# Patient Record
Sex: Male | Born: 1960 | Race: White | Hispanic: No | Marital: Single | Smoking: Current every day smoker
Health system: Southern US, Community
[De-identification: ages and names within clinical notes are randomized; demographics above are authoritative.]

## PROBLEM LIST (undated history)

## (undated) DIAGNOSIS — E119 Type 2 diabetes mellitus without complications: Secondary | ICD-10-CM

## (undated) DIAGNOSIS — I10 Essential (primary) hypertension: Secondary | ICD-10-CM

## (undated) DIAGNOSIS — J439 Emphysema, unspecified: Secondary | ICD-10-CM

## (undated) HISTORY — PX: ABDOMINAL SURGERY: SHX537

---

## 2006-10-16 ENCOUNTER — Ambulatory Visit (HOSPITAL_COMMUNITY): Admission: RE | Admit: 2006-10-16 | Discharge: 2006-10-16 | Payer: Self-pay | Admitting: Preventative Medicine

## 2006-10-17 ENCOUNTER — Encounter (HOSPITAL_COMMUNITY): Admission: RE | Admit: 2006-10-17 | Discharge: 2006-11-16 | Payer: Self-pay | Admitting: Preventative Medicine

## 2018-02-17 ENCOUNTER — Ambulatory Visit: Payer: Self-pay | Admitting: Nutrition

## 2018-03-19 ENCOUNTER — Ambulatory Visit: Payer: Self-pay | Admitting: "Endocrinology

## 2019-05-26 ENCOUNTER — Encounter (INDEPENDENT_AMBULATORY_CARE_PROVIDER_SITE_OTHER): Payer: Self-pay | Admitting: *Deleted

## 2019-08-18 ENCOUNTER — Other Ambulatory Visit: Payer: Self-pay

## 2019-08-18 ENCOUNTER — Inpatient Hospital Stay (HOSPITAL_COMMUNITY)
Admission: EM | Admit: 2019-08-18 | Discharge: 2019-08-22 | DRG: 152 | Disposition: A | Discharge status: Home / Self Care | Payer: Medicaid Other | Attending: Internal Medicine | Admitting: Internal Medicine

## 2019-08-18 ENCOUNTER — Emergency Department (HOSPITAL_COMMUNITY): Payer: Medicaid Other

## 2019-08-18 ENCOUNTER — Encounter (HOSPITAL_COMMUNITY): Payer: Self-pay

## 2019-08-18 DIAGNOSIS — E785 Hyperlipidemia, unspecified: Secondary | ICD-10-CM | POA: Diagnosis present

## 2019-08-18 DIAGNOSIS — I10 Essential (primary) hypertension: Secondary | ICD-10-CM | POA: Diagnosis not present

## 2019-08-18 DIAGNOSIS — E872 Acidosis: Secondary | ICD-10-CM | POA: Diagnosis present

## 2019-08-18 DIAGNOSIS — Z880 Allergy status to penicillin: Secondary | ICD-10-CM

## 2019-08-18 DIAGNOSIS — T380X5A Adverse effect of glucocorticoids and synthetic analogues, initial encounter: Secondary | ICD-10-CM | POA: Diagnosis not present

## 2019-08-18 DIAGNOSIS — Z4659 Encounter for fitting and adjustment of other gastrointestinal appliance and device: Secondary | ICD-10-CM

## 2019-08-18 DIAGNOSIS — G9341 Metabolic encephalopathy: Secondary | ICD-10-CM | POA: Diagnosis not present

## 2019-08-18 DIAGNOSIS — R1310 Dysphagia, unspecified: Secondary | ICD-10-CM | POA: Diagnosis not present

## 2019-08-18 DIAGNOSIS — J9600 Acute respiratory failure, unspecified whether with hypoxia or hypercapnia: Secondary | ICD-10-CM | POA: Diagnosis not present

## 2019-08-18 DIAGNOSIS — J9601 Acute respiratory failure with hypoxia: Secondary | ICD-10-CM | POA: Diagnosis present

## 2019-08-18 DIAGNOSIS — J0511 Acute epiglottitis with obstruction: Secondary | ICD-10-CM | POA: Diagnosis not present

## 2019-08-18 DIAGNOSIS — F419 Anxiety disorder, unspecified: Secondary | ICD-10-CM | POA: Diagnosis present

## 2019-08-18 DIAGNOSIS — J432 Centrilobular emphysema: Secondary | ICD-10-CM | POA: Diagnosis not present

## 2019-08-18 DIAGNOSIS — R739 Hyperglycemia, unspecified: Secondary | ICD-10-CM

## 2019-08-18 DIAGNOSIS — Z79899 Other long term (current) drug therapy: Secondary | ICD-10-CM

## 2019-08-18 DIAGNOSIS — F17210 Nicotine dependence, cigarettes, uncomplicated: Secondary | ICD-10-CM | POA: Diagnosis present

## 2019-08-18 DIAGNOSIS — Z20822 Contact with and (suspected) exposure to covid-19: Secondary | ICD-10-CM | POA: Diagnosis not present

## 2019-08-18 DIAGNOSIS — R490 Dysphonia: Secondary | ICD-10-CM | POA: Diagnosis present

## 2019-08-18 DIAGNOSIS — R319 Hematuria, unspecified: Secondary | ICD-10-CM | POA: Diagnosis present

## 2019-08-18 DIAGNOSIS — E1165 Type 2 diabetes mellitus with hyperglycemia: Secondary | ICD-10-CM | POA: Diagnosis present

## 2019-08-18 DIAGNOSIS — J029 Acute pharyngitis, unspecified: Secondary | ICD-10-CM | POA: Diagnosis present

## 2019-08-18 DIAGNOSIS — Z01818 Encounter for other preprocedural examination: Secondary | ICD-10-CM

## 2019-08-18 DIAGNOSIS — F329 Major depressive disorder, single episode, unspecified: Secondary | ICD-10-CM | POA: Diagnosis not present

## 2019-08-18 DIAGNOSIS — J0510 Acute epiglottitis without obstruction: Secondary | ICD-10-CM | POA: Diagnosis present

## 2019-08-18 HISTORY — DX: Emphysema, unspecified: J43.9

## 2019-08-18 HISTORY — DX: Essential (primary) hypertension: I10

## 2019-08-18 HISTORY — DX: Type 2 diabetes mellitus without complications: E11.9

## 2019-08-18 LAB — URINALYSIS, ROUTINE W REFLEX MICROSCOPIC
Bacteria, UA: NONE SEEN
Bilirubin Urine: NEGATIVE
Glucose, UA: >=500 mg/dL — AB
Ketones, ur: 20 mg/dL — AB
Leukocytes,Ua: NEGATIVE
Nitrite: NEGATIVE
Protein, ur: NEGATIVE mg/dL
Specific Gravity, Urine: 1.038 — ABNORMAL HIGH (ref 1.005–1.030)
pH: 5 (ref 5.0–8.0)

## 2019-08-18 LAB — CBC WITH DIFFERENTIAL/PLATELET
Abs Immature Granulocytes: 0.28 10*3/uL — ABNORMAL HIGH (ref 0.00–0.07)
Basophils Absolute: 0.1 10*3/uL (ref 0.0–0.1)
Basophils Relative: 0 %
Eosinophils Absolute: 0 10*3/uL (ref 0.0–0.5)
Eosinophils Relative: 0 %
HCT: 51.1 % (ref 39.0–52.0)
Hemoglobin: 17.4 g/dL — ABNORMAL HIGH (ref 13.0–17.0)
Immature Granulocytes: 1 %
Lymphocytes Relative: 4 %
Lymphs Abs: 0.8 10*3/uL (ref 0.7–4.0)
MCH: 29.9 pg (ref 26.0–34.0)
MCHC: 34.1 g/dL (ref 30.0–36.0)
MCV: 87.8 fL (ref 80.0–100.0)
Monocytes Absolute: 1.3 10*3/uL — ABNORMAL HIGH (ref 0.1–1.0)
Monocytes Relative: 6 %
Neutro Abs: 19 10*3/uL — ABNORMAL HIGH (ref 1.7–7.7)
Neutrophils Relative %: 89 %
Platelets: 191 10*3/uL (ref 150–400)
RBC: 5.82 MIL/uL — ABNORMAL HIGH (ref 4.22–5.81)
RDW: 12.6 % (ref 11.5–15.5)
WBC: 21.5 10*3/uL — ABNORMAL HIGH (ref 4.0–10.5)
nRBC: 0 % (ref 0.0–0.2)

## 2019-08-18 LAB — GLUCOSE, CAPILLARY
Glucose-Capillary: 414 mg/dL — ABNORMAL HIGH (ref 70–99)
Glucose-Capillary: 388 mg/dL — ABNORMAL HIGH (ref 70–99)
Glucose-Capillary: 309 mg/dL — ABNORMAL HIGH (ref 70–99)

## 2019-08-18 LAB — RESPIRATORY PANEL BY RT PCR (FLU A&B, COVID)
Influenza A by PCR: NEGATIVE
Influenza B by PCR: NEGATIVE
SARS Coronavirus 2 by RT PCR: NEGATIVE

## 2019-08-18 LAB — BASIC METABOLIC PANEL
Anion gap: 11 (ref 5–15)
BUN: 15 mg/dL (ref 6–20)
CO2: 23 mmol/L (ref 22–32)
Calcium: 9.5 mg/dL (ref 8.9–10.3)
Chloride: 99 mmol/L (ref 98–111)
Creatinine, Ser: 0.96 mg/dL (ref 0.61–1.24)
GFR calc Af Amer: >60 mL/min (ref >60)
GFR calc non Af Amer: >60 mL/min (ref >60)
Glucose, Bld: 461 mg/dL — ABNORMAL HIGH (ref 70–99)
Potassium: 4.5 mmol/L (ref 3.5–5.1)
Sodium: 133 mmol/L — ABNORMAL LOW (ref 135–145)

## 2019-08-18 LAB — RESPIRATORY PANEL BY PCR

## 2019-08-18 LAB — GROUP A STREP BY PCR: Group A Strep by PCR: NOT DETECTED

## 2019-08-18 LAB — LACTIC ACID, PLASMA
Lactic Acid, Venous: 2.1 mmol/L (ref 0.5–1.9)
Lactic Acid, Venous: 2.1 mmol/L (ref 0.5–1.9)

## 2019-08-18 LAB — HIV ANTIBODY (ROUTINE TESTING W REFLEX): HIV Screen 4th Generation wRfx: NONREACTIVE

## 2019-08-18 LAB — MRSA PCR SCREENING: MRSA by PCR: NEGATIVE

## 2019-08-18 LAB — HEMOGLOBIN A1C
Hgb A1c MFr Bld: 11.8 % — ABNORMAL HIGH (ref 4.8–5.6)
Mean Plasma Glucose: 291.96 mg/dL

## 2019-08-18 LAB — MONONUCLEOSIS SCREEN: Mono Screen: NEGATIVE

## 2019-08-18 LAB — CBG MONITORING, ED: Glucose-Capillary: 454 mg/dL — ABNORMAL HIGH (ref 70–99)

## 2019-08-18 MED ORDER — ROCURONIUM BROMIDE 50 MG/5ML IV SOLN
100.0000 mg | Freq: Once | INTRAVENOUS | Status: AC
  Administered 2019-08-18: 10:00:00 100 mg via INTRAVENOUS

## 2019-08-18 MED ORDER — KETAMINE HCL 10 MG/ML IJ SOLN: 1.0000 mg/kg | Freq: Once | INTRAMUSCULAR | Status: AC

## 2019-08-18 MED ORDER — PROPOFOL 1000 MG/100ML IV EMUL
INTRAVENOUS | Status: AC
  Administered 2019-08-18: 10:00:00 10 ug/kg/min via INTRAVENOUS
  Filled 2019-08-18: qty 100

## 2019-08-18 MED ORDER — FENTANYL CITRATE (PF) 100 MCG/2ML IJ SOLN
50.0000 ug | Freq: Once | INTRAMUSCULAR | Status: AC
  Administered 2019-08-18: 50 ug via INTRAVENOUS
  Filled 2019-08-18: qty 2

## 2019-08-18 MED ORDER — FENTANYL CITRATE (PF) 100 MCG/2ML IJ SOLN
50.0000 ug | Freq: Once | INTRAMUSCULAR | Status: AC
  Administered 2019-08-18: 10:00:00 50 ug via INTRAVENOUS
  Filled 2019-08-18: qty 2

## 2019-08-18 MED ORDER — DEXAMETHASONE SODIUM PHOSPHATE 10 MG/ML IJ SOLN
10.0000 mg | INTRAMUSCULAR | Status: DC
  Administered 2019-08-19: 09:00:00 10 mg via INTRAVENOUS
  Filled 2019-08-18: qty 1

## 2019-08-18 MED ORDER — INSULIN ASPART 100 UNIT/ML ~~LOC~~ SOLN
0.0000 [IU] | Freq: Three times a day (TID) | SUBCUTANEOUS | Status: DC
  Administered 2019-08-18: 16:00:00 15 [IU] via SUBCUTANEOUS

## 2019-08-18 MED ORDER — PANTOPRAZOLE SODIUM 40 MG IV SOLR
40.0000 mg | Freq: Every day | INTRAVENOUS | Status: DC
  Administered 2019-08-18 – 2019-08-20 (×3): 40 mg via INTRAVENOUS
  Filled 2019-08-18 (×3): qty 40

## 2019-08-18 MED ORDER — INSULIN ASPART 100 UNIT/ML ~~LOC~~ SOLN
0.0000 [IU] | SUBCUTANEOUS | Status: DC
  Administered 2019-08-18 (×2): 11 [IU] via SUBCUTANEOUS
  Administered 2019-08-19 (×3): 3 [IU] via SUBCUTANEOUS
  Administered 2019-08-20: 04:00:00 4 [IU] via SUBCUTANEOUS
  Administered 2019-08-20 – 2019-08-21 (×2): 3 [IU] via SUBCUTANEOUS
  Administered 2019-08-21: 16:00:00 4 [IU] via SUBCUTANEOUS
  Administered 2019-08-21: 7 [IU] via SUBCUTANEOUS
  Administered 2019-08-22: 3 [IU] via SUBCUTANEOUS
  Administered 2019-08-22: 10:00:00 7 [IU] via SUBCUTANEOUS

## 2019-08-18 MED ORDER — IPRATROPIUM-ALBUTEROL 0.5-2.5 (3) MG/3ML IN SOLN: 3.0000 mL | RESPIRATORY_TRACT | Status: DC | PRN

## 2019-08-18 MED ORDER — PROPOFOL 1000 MG/100ML IV EMUL
0.0000 ug/kg/min | INTRAVENOUS | Status: DC
  Administered 2019-08-18: 16:00:00 40 ug/kg/min via INTRAVENOUS
  Administered 2019-08-18: 14:00:00 50 ug/kg/min via INTRAVENOUS
  Administered 2019-08-18: 22:00:00 40 ug/kg/min via INTRAVENOUS
  Administered 2019-08-19: 18:00:00 25 ug/kg/min via INTRAVENOUS
  Administered 2019-08-19 (×2): 30 ug/kg/min via INTRAVENOUS
  Administered 2019-08-20: 09:00:00 25 ug/kg/min via INTRAVENOUS
  Administered 2019-08-20: 01:00:00 30 ug/kg/min via INTRAVENOUS
  Filled 2019-08-18 (×7): qty 100

## 2019-08-18 MED ORDER — INSULIN ASPART 100 UNIT/ML ~~LOC~~ SOLN: 0.0000 [IU] | Freq: Every day | SUBCUTANEOUS | Status: DC

## 2019-08-18 MED ORDER — BISACODYL 10 MG RE SUPP: 10.0000 mg | Freq: Every day | RECTAL | Status: DC | PRN

## 2019-08-18 MED ORDER — HYDROMORPHONE HCL 1 MG/ML IJ SOLN
1.0000 mg | Freq: Once | INTRAMUSCULAR | Status: DC
  Filled 2019-08-18: qty 1

## 2019-08-18 MED ORDER — DOCUSATE SODIUM 50 MG/5ML PO LIQD: 100.0000 mg | Freq: Two times a day (BID) | ORAL | Status: DC | PRN

## 2019-08-18 MED ORDER — MIDAZOLAM HCL 2 MG/2ML IJ SOLN
2.0000 mg | Freq: Once | INTRAMUSCULAR | Status: AC
  Administered 2019-08-18: 14:00:00 2 mg via INTRAVENOUS

## 2019-08-18 MED ORDER — HYDROCODONE-ACETAMINOPHEN 7.5-325 MG/15ML PO SOLN
10.0000 mL | Freq: Once | ORAL | Status: AC
  Administered 2019-08-18: 08:00:00 10 mL via ORAL
  Filled 2019-08-18: qty 15

## 2019-08-18 MED ORDER — ALBUTEROL SULFATE (2.5 MG/3ML) 0.083% IN NEBU
10.0000 mg | INHALATION_SOLUTION | Freq: Once | RESPIRATORY_TRACT | Status: AC
  Administered 2019-08-18: 10:00:00 10 mg via RESPIRATORY_TRACT

## 2019-08-18 MED ORDER — MIDAZOLAM HCL 2 MG/2ML IJ SOLN
2.0000 mg | Freq: Once | INTRAMUSCULAR | Status: AC
  Administered 2019-08-18: 2 mg via INTRAVENOUS
  Filled 2019-08-18: qty 2

## 2019-08-18 MED ORDER — LACTATED RINGERS IV BOLUS
1000.0000 mL | Freq: Once | INTRAVENOUS | Status: AC
  Administered 2019-08-18: 17:00:00 1000 mL via INTRAVENOUS

## 2019-08-18 MED ORDER — ETOMIDATE 2 MG/ML IV SOLN: 0.3000 mg/kg | Freq: Once | INTRAVENOUS | Status: DC

## 2019-08-18 MED ORDER — CHLORHEXIDINE GLUCONATE 0.12% ORAL RINSE (MEDLINE KIT)
15.0000 mL | Freq: Two times a day (BID) | OROMUCOSAL | Status: DC
  Administered 2019-08-18 – 2019-08-21 (×6): 15 mL via OROMUCOSAL

## 2019-08-18 MED ORDER — FENTANYL 2500MCG IN NS 250ML (10MCG/ML) PREMIX INFUSION
50.0000 ug/h | INTRAVENOUS | Status: DC
  Administered 2019-08-18: 15:00:00 50 ug/h via INTRAVENOUS
  Administered 2019-08-19: 14:00:00 150 ug/h via INTRAVENOUS
  Administered 2019-08-20: 04:00:00 175 ug/h via INTRAVENOUS
  Filled 2019-08-18 (×2): qty 250

## 2019-08-18 MED ORDER — DEXAMETHASONE SODIUM PHOSPHATE 10 MG/ML IJ SOLN
10.0000 mg | Freq: Once | INTRAMUSCULAR | Status: AC
  Administered 2019-08-18: 10:00:00 10 mg via INTRAVENOUS
  Filled 2019-08-18: qty 1

## 2019-08-18 MED ORDER — NICOTINE 14 MG/24HR TD PT24
14.0000 mg | MEDICATED_PATCH | Freq: Every day | TRANSDERMAL | Status: DC
  Administered 2019-08-18 – 2019-08-22 (×5): 14 mg via TRANSDERMAL
  Filled 2019-08-18 (×5): qty 1

## 2019-08-18 MED ORDER — ORAL CARE MOUTH RINSE
15.0000 mL | OROMUCOSAL | Status: DC
  Administered 2019-08-18: 14:00:00 15 mL via OROMUCOSAL

## 2019-08-18 MED ORDER — SODIUM CHLORIDE 0.9 % IV SOLN
1.0000 g | Freq: Once | INTRAVENOUS | Status: AC
  Administered 2019-08-18: 1 g via INTRAVENOUS
  Filled 2019-08-18: qty 10

## 2019-08-18 MED ORDER — IOHEXOL 300 MG/ML  SOLN
75.0000 mL | Freq: Once | INTRAMUSCULAR | Status: AC | PRN
  Administered 2019-08-18: 09:00:00 75 mL via INTRAVENOUS

## 2019-08-18 MED ORDER — CHLORHEXIDINE GLUCONATE CLOTH 2 % EX PADS
6.0000 | MEDICATED_PAD | Freq: Every day | CUTANEOUS | Status: DC
  Administered 2019-08-19 – 2019-08-21 (×3): 6 via TOPICAL

## 2019-08-18 MED ORDER — FENTANYL BOLUS VIA INFUSION
50.0000 ug | INTRAVENOUS | Status: DC | PRN
  Administered 2019-08-20: 50 ug via INTRAVENOUS
  Filled 2019-08-18: qty 50

## 2019-08-18 MED ORDER — SUCCINYLCHOLINE CHLORIDE 20 MG/ML IJ SOLN
100.0000 mg | Freq: Once | INTRAMUSCULAR | Status: AC
  Administered 2019-08-18: 10:00:00 100 mg via INTRAVENOUS
  Filled 2019-08-18: qty 1

## 2019-08-18 MED ORDER — KETAMINE HCL 10 MG/ML IJ SOLN
INTRAMUSCULAR | Status: AC
  Administered 2019-08-18: 10:00:00 50 mg via INTRAVENOUS
  Filled 2019-08-18: qty 1

## 2019-08-18 MED ORDER — MIDAZOLAM HCL 2 MG/2ML IJ SOLN
2.0000 mg | INTRAMUSCULAR | Status: DC | PRN
  Administered 2019-08-18 – 2019-08-19 (×2): 2 mg via INTRAVENOUS
  Filled 2019-08-18 (×2): qty 2

## 2019-08-18 MED ORDER — LACTATED RINGERS IV SOLN
INTRAVENOUS | Status: DC
  Administered 2019-08-19: 950 mL via INTRAVENOUS
  Administered 2019-08-20: 08:00:00 100 mL/h via INTRAVENOUS

## 2019-08-18 MED ORDER — CHLORHEXIDINE GLUCONATE 0.12% ORAL RINSE (MEDLINE KIT): 15.0000 mL | Freq: Two times a day (BID) | OROMUCOSAL | Status: DC

## 2019-08-18 MED ORDER — ALBUTEROL (5 MG/ML) CONTINUOUS INHALATION SOLN
INHALATION_SOLUTION | RESPIRATORY_TRACT | Status: AC
  Filled 2019-08-18: qty 20

## 2019-08-18 MED ORDER — SODIUM CHLORIDE 0.9 % IV SOLN
1.0000 g | INTRAVENOUS | Status: DC
  Administered 2019-08-19 – 2019-08-21 (×3): 1 g via INTRAVENOUS
  Filled 2019-08-18 (×3): qty 10

## 2019-08-18 MED ORDER — ACETAMINOPHEN 325 MG PO TABS
650.0000 mg | ORAL_TABLET | ORAL | Status: DC | PRN
  Administered 2019-08-21: 650 mg via ORAL
  Filled 2019-08-18: qty 2

## 2019-08-18 MED ORDER — ENOXAPARIN SODIUM 40 MG/0.4ML ~~LOC~~ SOLN
40.0000 mg | SUBCUTANEOUS | Status: DC
  Administered 2019-08-18 – 2019-08-21 (×4): 40 mg via SUBCUTANEOUS
  Filled 2019-08-18 (×4): qty 0.4

## 2019-08-18 MED ORDER — MIDAZOLAM HCL 2 MG/2ML IJ SOLN
INTRAMUSCULAR | Status: AC
  Filled 2019-08-18: qty 2

## 2019-08-18 MED ORDER — ORAL CARE MOUTH RINSE
15.0000 mL | OROMUCOSAL | Status: DC
  Administered 2019-08-18 – 2019-08-21 (×28): 15 mL via OROMUCOSAL

## 2019-08-18 MED ORDER — PROPOFOL 1000 MG/100ML IV EMUL: 5.0000 ug/kg/min | INTRAVENOUS | Status: DC

## 2019-08-18 MED ORDER — INSULIN DETEMIR 100 UNIT/ML ~~LOC~~ SOLN
23.0000 [IU] | Freq: Every day | SUBCUTANEOUS | Status: DC
  Administered 2019-08-18 – 2019-08-19 (×2): 23 [IU] via SUBCUTANEOUS
  Filled 2019-08-18 (×3): qty 0.23

## 2019-08-18 NOTE — Sedation Documentation (Signed)
Dr. Lovell Sheehan and Dr. Hyacinth Meeker at bedside.

## 2019-08-18 NOTE — Sedation Documentation (Signed)
Dr Hyacinth Meeker intubated with size 7ett, positive color change on co2 detector and breath sounds audible.  Secured by respiratory at 23cm at lip.

## 2019-08-18 NOTE — Sedation Documentation (Signed)
All medications during sedation were administered by D. Daphine Deutscher, RN

## 2019-08-18 NOTE — H&P (Signed)
CRITICAL CARE MEDICINE  NAME:  Ronald Brady, MRN:  [id number], DOB:  May 24, 1961, LOS: 0 ADMISSION DATE:  08/18/2019, CONSULTATION DATE:  08/18/19 REFERRING MD:  Dr. Sabra Heck, CHIEF COMPLAINT:  Sore throat/epiglotitis  Brief History   59yo male w/PMHx emphysema, tobacco use disorder, HTN and DMII presenting from [facility] ED for acute epiglottitis requiring intubation for airway protection.   History of present illness   Patient is a 59yo male with PMHx of emphysema, hypertension and type 2 diabetes mellitus presenting to [facility] ED with one day of progressively worsening sore throat, dysphagia and voice changes. He denied any fevers, chills, nausea, vomiting, diarrhea or recent sick contacts. Patient noted to have mild tachycardia and erythematous pharynx. Work up significant for severe epiglottitis on CT soft tissue of neck without any obvious abscess formation. Patient intubated for airway protection. He was given one dose of steroids, rocephin and placed on propofol gtt. He was transferred to [facility] ICU.   Past Medical History   Past Medical History:  Diagnosis Date  . Diabetes mellitus without complication ([place])   . Emphysema lung ([place])   . Hypertension    Significant Hospital Events   1/26: intubated and transferred to ICU from [facility] ED  Consults:  PCCM  Procedures:  ETT 1/26 >>  Significant Diagnostic Tests:  CT Soft Tissue Neck w Contrast (1/26)  Epiglottitis with marked swelling and severe airway narrowing Emphysema   Micro Data:  1/26 Group A Strep> negative 1/26 SARS CoV-2/Influenza A&B > negative 1/26 RVP> 1/26 HIV> 1/26 Blood cx>  Antimicrobials:  Ceftriaxone 1/26 >>   Interim history/subjective:  Patient with severe epiglottitis at [facility] ED s/p intubation for airway protection and transferred to [facility] ICU.   Objective   Blood pressure 98/67, pulse (!) 104, temperature 98.7 F (37.1 C), temperature source Oral, resp. rate (!) 25, height 5'  5" (1.651 m), weight 73.5 kg, SpO2 100 %.    Vent Mode: PRVC FiO2 (%):  [60 %-100 %] 60 % Set Rate:  [14 bmp] 14 bmp Vt Set:  [490 mL] 490 mL PEEP:  [5 cmH20] 5 cmH20 Plateau Pressure:  [13 cmH20] 13 cmH20   Intake/Output Summary (Last 24 hours) at 08/18/2019 1416 Last data filed at 08/18/2019 1400 Gross per 24 hour  Intake 100 ml  Output 1000 ml  Net -900 ml   Filed Weights   08/18/19 0709  Weight: 73.5 kg    Examination: General: healthy appearing male, intubated and sedated in NAD HENT: normocephalic, atraumatic, MM pink/moist; intubated Lungs: Clear to auscultation bilaterally, Synchronous with vent on FiO2 60%, PEEP 5 Cardiovascular: S1 and S2 appreciated, RRR, no murmurs, rubs or gallops Abdomen: soft, nondistended, normoactive bowel sounds Extremities: warm/dry, nonedematous Neuro: sedated GU: foley in place  Resolved Hospital Problem list    Assessment & Plan:  59yo male w/Hx of DMII, emphysema, tobacco use disorder, and HTN presenting with severe epiglottitis s/p intubation.   Severe epiglottitis s/p intubation Patient presented to [facility] ED on 1/26 with progressively worsening sore throat, dysphagia and voice changes for one day. Labs significant for neutrophil predominant leukocytosis. Imaging with severe epiglottitis for which patient was intubated for airway protection at [facility] ED. He was given one dose of rocephin and steroids and started on propofol gtt. Subsequently transferred to [facility] ICU. Also noted to have lactic acidosis.  Patient remains sedated and intubated and synchronous with vent on FiO2 60%, PEEP 5. Discussed with  ENT and recommended for  continued medical management with vent support, antibiotics and steroids for inflammation for 48-72 hours.  Plan:  - Continue full vent support, goal SpO2 88-92% - Continue ceftriaxone and decadron daily - HIV, RVP - f/u Blood cx  - If unable to extubate in 48-72 hours, will reconsult ENT  DMII:  HbA1c  11.8 w/CBG in 400's. Patient on levemir 23U bid and Humalog 16U tid w/meals at home. UA with glucosuria, hematuria and mild ketonuria, likely in setting of poorly controlled DMII. Patient currently NPO. Plan:  - CBG monitoring q4h - Levemir 23U qHS + qHS coverage - SSI  Hx of emphysema:  Patient w/hx of emphysema on spiriva, symbicort and proventil at home. Currently on mechanical support 2/2 severe epiglottitis.  Plan: - Continue full vent support - Goal SpO2 88-92%  Best practice:  Diet: NPO Pain/Anxiety/Delirium protocol (if indicated): propofol, fentanyl VAP protocol (if indicated): per protocol DVT prophylaxis: Lovenox, SCD's GI prophylaxis: Protonix Glucose control: Levemir 23U, SSI, qHS coverage Mobility: Bedbound Code Status: FULL Family Communication: daughter updated on 1/26 Disposition: ICU  Labs   CBC: Recent Labs  Lab 08/18/19 0728  WBC 21.5*  NEUTROABS 19.0*  HGB 17.4*  HCT 51.1  MCV 87.8  PLT 191    Basic Metabolic Panel: Recent Labs  Lab 08/18/19 0728  NA 133*  K 4.5  CL 99  CO2 23  GLUCOSE 461*  BUN 15  CREATININE 0.96  CALCIUM 9.5   GFR: Estimated Creatinine Clearance: 73.8 mL/min (by C-G formula based on SCr of 0.96 mg/dL). Recent Labs  Lab 08/18/19 0728  WBC 21.5*    Liver Function Tests: No results for input(s): AST, ALT, ALKPHOS, BILITOT, PROT, ALBUMIN in the last 168 hours. No results for input(s): LIPASE, AMYLASE in the last 168 hours. No results for input(s): AMMONIA in the last 168 hours.  ABG No results found for: PHART, PCO2ART, PO2ART, HCO3, TCO2, ACIDBASEDEF, O2SAT   Coagulation Profile: No results for input(s): INR, PROTIME in the last 168 hours.  Cardiac Enzymes: No results for input(s): CKTOTAL, CKMB, CKMBINDEX, TROPONINI in the last 168 hours.  HbA1C: No results found for: HGBA1C  CBG: Recent Labs  Lab 08/18/19 0742  GLUCAP 454*    Review of Systems:   Level 5 caveat - intubated/sedated  Past  Medical History  He,  has a past medical history of Diabetes mellitus without complication (HCC), Emphysema lung (HCC), and Hypertension.   Surgical History    Past Surgical History:  Procedure Laterality Date  . ABDOMINAL SURGERY       Social History   reports that he has been smoking. He has been smoking about 1.50 packs per day. He has never used smokeless tobacco. He reports that he does not drink alcohol or use drugs.   Family History   His family history is not on file.   Allergies Allergies  Allergen Reactions  . Penicillins     Pt is unsure of reaction. States PCP told him he was allergic     Home Medications  Prior to Admission medications   Not on File     Critical care time:     Eliezer Bottom, MD Internal Medicine, PGY-1 Pager # [phone] 08/18/19  2:51 PM   Please see Attending Attestation for final assessment and plan/recommendations.

## 2019-08-18 NOTE — ED Triage Notes (Signed)
Pt started having a sore throat yesterday and states it is progressively getting worse. States it is hard to swallow. Pt believes he has strep throat.

## 2019-08-18 NOTE — ED Notes (Signed)
Pt reaching for ETT, but easily redirected.  Follows commands.

## 2019-08-18 NOTE — ED Notes (Signed)
Pt attempting to wake.  Follow simple commands.  Increased Propofol drip to 20 mcg/kg/min or 8.9 ml/hr via Pump.

## 2019-08-18 NOTE — Progress Notes (Signed)
Pt arrived from [place] per [facility] admitted to 2M08

## 2019-08-18 NOTE — ED Notes (Signed)
Spoke with Tab (daughter) regarding pt status.  Informed of treatment rendered and EDP or Admitting MD may give her at calling.  Answered her questions and Tab is to come pick up pts belongings.

## 2019-08-18 NOTE — Progress Notes (Signed)
Spoke w/ pts dtr to provide updates. Pts dtr appreciative.  

## 2019-08-18 NOTE — Sedation Documentation (Signed)
Another dose of 50mg  ketamine given iv per Dr. ordered

## 2019-08-18 NOTE — Progress Notes (Signed)
Pts first lactic acid is 2.1. Relayed to CCM MD

## 2019-08-18 NOTE — ED Provider Notes (Signed)
[facility] EMERGENCY DEPARTMENT Provider Note   CSN: [id number] Arrival date & time: 08/18/19  2536     History Chief Complaint  Patient presents with  . Sore Throat    Ronald Brady is a 59 y.o. male.   Sore Throat     This patient is a 59 year old male, known history of diabetes, emphysema and hypertension, presents to the hospital with a complaint of a sore throat which started last night seem to get worse overnight and was severe this morning.  He notes that his voice is slightly off, he feels like he has pain with swallowing, denies anybody sick being around him in fact he states he lives by himself.  The patient has not had any fevers or chills and denies nausea vomiting or diarrhea.  He has had no medication for this prior to arrival.  Symptoms are persistent, severe, seems to gradually be worsening.  He does report that as a diabetic he takes his medications but has had high blood sugars reporting sugars usually between 3 and 400 at his baseline  Past Medical History:  Diagnosis Date  . Diabetes mellitus without complication (HCC)   . Emphysema lung (HCC)   . Hypertension     There are no problems to display for this patient.   Past Surgical History:  Procedure Laterality Date  . ABDOMINAL SURGERY         No family history on file.  Social History   Tobacco Use  . Smoking status: Current Every Day Smoker    Packs/day: 1.50  . Smokeless tobacco: Never Used  Substance Use Topics  . Alcohol use: Never  . Drug use: Never    Home Medications Prior to Admission medications   Not on File    Allergies    Penicillins  Review of Systems   Review of Systems  All other systems reviewed and are negative.   Physical Exam Updated Vital Signs BP 103/70 (BP Location: Right Arm)   Pulse (!) 117   Temp 98.3 F (36.8 C) (Oral)   Resp 15   Ht 1.651 m (5\' 5" )   Wt 73.5 kg   SpO2 100%   BMI 26.96 kg/m   Physical Exam Vitals and nursing note  reviewed.  Constitutional:      General: He is not in acute distress.    Appearance: He is well-developed.  HENT:     Head: Normocephalic and atraumatic.     Mouth/Throat:     Pharynx: No oropharyngeal exudate.     Comments: There is erythema to the bilateral tonsils however there is no asymmetry, no exudate, midline uvula, no trismus or torticollis, speaks in full sentences, voice sounds normal Eyes:     General: No scleral icterus.       Right eye: No discharge.        Left eye: No discharge.     Conjunctiva/sclera: Conjunctivae normal.     Pupils: Pupils are equal, round, and reactive to light.  Neck:     Thyroid: No thyromegaly.     Vascular: No JVD.     Comments: Mild bilateral anterior cervical lymphadenopathy, otherwise very supple neck Cardiovascular:     Rate and Rhythm: Regular rhythm. Tachycardia present.     Heart sounds: Normal heart sounds. No murmur. No friction rub. No gallop.      Comments: Tachycardic to 110 bpm Pulmonary:     Effort: Pulmonary effort is normal. No respiratory distress.     Breath  sounds: Normal breath sounds. No wheezing or rales.  Abdominal:     General: Bowel sounds are normal. There is no distension.     Palpations: Abdomen is soft. There is no mass.     Tenderness: There is no abdominal tenderness.  Musculoskeletal:        General: No tenderness. Normal range of motion.     Cervical back: Normal range of motion and neck supple. No rigidity.  Lymphadenopathy:     Cervical: Cervical adenopathy present.  Skin:    General: Skin is warm and dry.     Findings: No erythema or rash.  Neurological:     Mental Status: He is alert.     Coordination: Coordination normal.     Comments: Normal speech, normal gait, normal use of all 4 extremities, cranial nerves III through XII appear intact  Psychiatric:        Behavior: Behavior normal.     ED Results / Procedures / Treatments   Labs (all labs ordered are listed, but only abnormal results  are displayed) Labs Reviewed  GROUP A STREP BY PCR  BASIC METABOLIC PANEL  CBC WITH DIFFERENTIAL/PLATELET  MONONUCLEOSIS SCREEN  CBG MONITORING, ED    EKG None  Radiology CT Soft Tissue Neck W Contrast  Result Date: 08/18/2019 CLINICAL DATA:  Neck pain with acute infection suspected EXAM: CT NECK WITH CONTRAST TECHNIQUE: Multidetector CT imaging of the neck was performed using the standard protocol following the bolus administration of intravenous contrast. CONTRAST:  14mL OMNIPAQUE IOHEXOL 300 MG/ML  SOLN COMPARISON:  None. FINDINGS: Pharynx and larynx: Marked submucosal low-density thickening of the epiglottis and involving the left more than right aryepiglottic fold. There is related airway narrowing to 2 mm anterior to posterior as measured on sagittal reformats. Inflammation continues towards the root of the tongue but does not clearly extend anterior to the hyoid. No mass effect at the floor of mouth. Mild thickening of the tonsils with some low-density within left-sided crypts but no hyperenhancement or peritonsillar inflammation. Salivary glands: No inflammation, mass, or stone. Thyroid: Normal. Lymph nodes: None enlarged or abnormal density. Vascular: Mild atherosclerotic calcification Limited intracranial: Negative Visualized orbits: Limited coverage is negative Mastoids and visualized paranasal sinuses: Debris in the left nostril, nonspecific in this setting. Skeleton: Mild cervical spine degeneration. Atlantooccipital non segmentation. Upper chest: Emphysema with saber trachea and airway thickening. No visible pneumonia or aspiration. Other: Critical Value/emergent results were called by telephone at the time of interpretation on 08/18/2019 at 8:56 am to providerBRIAN Cintia Gleed , who verbally acknowledged these results. IMPRESSION: 1. Epiglottitis with marked swelling and severe airway narrowing. 2.  Emphysema (ICD10-J43.9). Electronically Signed   By: Monte Fantasia M.D.   On: 08/18/2019  09:00    Procedures .Critical Care Performed by: Noemi Chapel, MD Authorized by: Noemi Chapel, MD   Critical care provider statement:    Critical care time (minutes):  80   Critical care time was exclusive of:  Separately billable procedures and treating other patients and teaching time   Critical care was necessary to treat or prevent imminent or life-threatening deterioration of the following conditions:  Respiratory failure   Critical care was time spent personally by me on the following activities:  Blood draw for specimens, development of treatment plan with patient or surrogate, discussions with consultants, evaluation of patient's response to treatment, examination of patient, obtaining history from patient or surrogate, ordering and performing treatments and interventions, ordering and review of laboratory studies, ordering and review of  radiographic studies, pulse oximetry, re-evaluation of patient's condition and review of old charts Comments:       Procedure Name: Intubation Date/Time: 08/18/2019 10:06 AM Performed by: Eber Hong, MD Pre-anesthesia Checklist: Patient identified, Patient being monitored, Emergency Drugs available, Timeout performed and Suction available Oxygen Delivery Method: Non-rebreather mask Preoxygenation: Pre-oxygenation with 100% oxygen Ventilation: Mask ventilation without difficulty Laryngoscope Size: Glidescope and 5 Grade View: Grade IV Tube size: 7.0 mm Number of attempts: 1 Airway Equipment and Method: Video-laryngoscopy Placement Confirmation: ETT inserted through vocal cords under direct vision,  CO2 detector and Breath sounds checked- equal and bilateral Secured at: 23 cm Tube secured with: ETT holder Dental Injury: Teeth and Oropharynx as per pre-operative assessment  Difficulty Due To: Difficulty was anticipated Comments: This patient was sedated with a combination of 50 mcg of fentanyl, 2 mg of Versed, 100 mg of ketamine with  successful sedation.  Intubated on the first attempt with glide scope laryngoscopy with a 7-0 endotracheal tube.  Dr. Lovell Sheehan with general surgery at the bedside in case tracheostomy was required however this was not needed as the patient was successfully intubated.       (including critical care time)  Medications Ordered in ED Medications  HYDROcodone-acetaminophen (HYCET) 7.5-325 mg/15 ml solution 10 mL (has no administration in time range)    ED Course  I have reviewed the triage vital signs and the nursing notes.  Pertinent labs & imaging results that were available during my care of the patient were reviewed by me and considered in my medical decision making (see chart for details).    MDM Rules/Calculators/A&P                      This patient has a mild tachycardia, he is afebrile and has a pharynx that appears erythematous but not overtly exudative.  He will need further evaluation with rapid strep, he does have severe pain in his throat and has a slight hoarseness to his voice thus a CT scan will be ordered as there is nothing visualized on clinical exam with a tongue depressor that would suggest an answer to that.  The patient does not recall which medications he takes, I do not see any signs of angioedema but will have the medicine pharmacy technician reach out to his pharmacy to get a med list.  The patient cannot tell me what medicines he takes  I discussed the case with the radiologist who has seen severe epiglottitis.  Discussed the case with general surgery who will come to the bedside, will try to get anesthesiology, the patient will likely need to be intubated given this rapid progression.  I also discussed the case with pulmonary critical care Dr. Sherene Sires who agrees that this patient needs to be intubated  I personally viewed the chest x-ray after the intubation, the lungs are clear, the endotracheal tube is just above the carina and the orogastric tube which I placed is  successfully coursing below the diaphragm.  This patient is critically ill currently on propofol getting Rocephin, Decadron, stable blood pressure, heart rate is 120, afebrile.  There is a significant leukocytosis which fits the picture of an acute epiglottitis.  This patient is requiring constant sedation with propofol and will need to go to the intensive care unit.  He is critically ill.  Covid test pending.  Multiple repeat evaluations, evaluating for interventions including for sedation as well as infectious treatments.  Airway repositioning, adequately oxygenating, the patient has stabilize  significantly after this intervention which undoubtedly prevented a severe respiratory failure due to the epiglottis obstruction  I discussed the care with both the hospitalist as well as the intensivist and the decision was made to transfer the patient to [facility] intensive care unit for ENT accessibility   Ronald Brady was evaluated in Emergency Department on 08/18/2019 for the symptoms described in the history of present illness. He was evaluated in the context of the global COVID-19 pandemic, which necessitated consideration that the patient might be at risk for infection with the SARS-CoV-2 virus that causes COVID-19. Institutional protocols and algorithms that pertain to the evaluation of patients at risk for COVID-19 are in a state of rapid change based on information released by regulatory bodies including the CDC and federal and state organizations. These policies and algorithms were followed during the patient's care in the ED.   Final Clinical Impression(s) / ED Diagnoses Final diagnoses:  Acute epiglottitis with airway obstruction  Acute respiratory failure, unspecified whether with hypoxia or hypercapnia (HCC)  Hyperglycemia      Eber Hong, MD 08/18/19 1059

## 2019-08-19 ENCOUNTER — Inpatient Hospital Stay (HOSPITAL_COMMUNITY): Payer: Medicaid Other

## 2019-08-19 LAB — GLUCOSE, CAPILLARY
Glucose-Capillary: 109 mg/dL — ABNORMAL HIGH (ref 70–99)
Glucose-Capillary: 114 mg/dL — ABNORMAL HIGH (ref 70–99)
Glucose-Capillary: 124 mg/dL — ABNORMAL HIGH (ref 70–99)
Glucose-Capillary: 137 mg/dL — ABNORMAL HIGH (ref 70–99)
Glucose-Capillary: 142 mg/dL — ABNORMAL HIGH (ref 70–99)
Glucose-Capillary: 145 mg/dL — ABNORMAL HIGH (ref 70–99)
Glucose-Capillary: 287 mg/dL — ABNORMAL HIGH (ref 70–99)
Glucose-Capillary: 293 mg/dL — ABNORMAL HIGH (ref 70–99)
Glucose-Capillary: 439 mg/dL — ABNORMAL HIGH (ref 70–99)

## 2019-08-19 LAB — CBC
HCT: 42.2 % (ref 39.0–52.0)
Hemoglobin: 14.2 g/dL (ref 13.0–17.0)
MCH: 29.8 pg (ref 26.0–34.0)
MCHC: 33.6 g/dL (ref 30.0–36.0)
MCV: 88.7 fL (ref 80.0–100.0)
Platelets: 147 10*3/uL — ABNORMAL LOW (ref 150–400)
RBC: 4.76 MIL/uL (ref 4.22–5.81)
RDW: 12.7 % (ref 11.5–15.5)
WBC: 14.8 10*3/uL — ABNORMAL HIGH (ref 4.0–10.5)
nRBC: 0 % (ref 0.0–0.2)

## 2019-08-19 LAB — PHOSPHORUS: Phosphorus: 3.5 mg/dL (ref 2.5–4.6)

## 2019-08-19 LAB — BASIC METABOLIC PANEL
Anion gap: 9 (ref 5–15)
BUN: 19 mg/dL (ref 6–20)
CO2: 24 mmol/L (ref 22–32)
Calcium: 9 mg/dL (ref 8.9–10.3)
Chloride: 106 mmol/L (ref 98–111)
Creatinine, Ser: 0.75 mg/dL (ref 0.61–1.24)
GFR calc Af Amer: >60 mL/min (ref >60)
GFR calc non Af Amer: >60 mL/min (ref >60)
Glucose, Bld: 143 mg/dL — ABNORMAL HIGH (ref 70–99)
Potassium: 3.8 mmol/L (ref 3.5–5.1)
Sodium: 139 mmol/L (ref 135–145)

## 2019-08-19 LAB — TRIGLYCERIDES: Triglycerides: 37 mg/dL (ref <150)

## 2019-08-19 LAB — MAGNESIUM: Magnesium: 2.2 mg/dL (ref 1.7–2.4)

## 2019-08-19 MED ORDER — INSULIN ASPART 100 UNIT/ML ~~LOC~~ SOLN
5.0000 [IU] | SUBCUTANEOUS | Status: DC
  Administered 2019-08-19 – 2019-08-20 (×6): 5 [IU] via SUBCUTANEOUS

## 2019-08-19 MED ORDER — LACTATED RINGERS IV BOLUS
1000.0000 mL | Freq: Once | INTRAVENOUS | Status: AC
  Administered 2019-08-19: 14:00:00 1000 mL via INTRAVENOUS

## 2019-08-19 MED ORDER — PRO-STAT SUGAR FREE PO LIQD: 30.0000 mL | Freq: Two times a day (BID) | ORAL | Status: DC

## 2019-08-19 MED ORDER — VITAL HIGH PROTEIN PO LIQD: 1000.0000 mL | ORAL | Status: DC

## 2019-08-19 MED ORDER — VITAL AF 1.2 CAL PO LIQD
1000.0000 mL | ORAL | Status: DC
  Administered 2019-08-19: 1000 mL

## 2019-08-19 MED ORDER — LACTATED RINGERS IV BOLUS
1000.0000 mL | Freq: Once | INTRAVENOUS | Status: AC
  Administered 2019-08-19: 10:00:00 1000 mL via INTRAVENOUS

## 2019-08-19 NOTE — Progress Notes (Signed)
Initial Nutrition Assessment  DOCUMENTATION CODES:   Not applicable  INTERVENTION:  Initiate TF via OG tube with Vital AF 1.2 Cal at goal rate of 55 ml/h (1320 ml per day) to provide 1584 kcals, 99 gm protein, 1,070 ml free water daily. TF regimen and propofol at current rate (11.32mL/hour) providing 1875 total kcal/day (99 % of kcal needs)   NUTRITION DIAGNOSIS:   Inadequate oral intake related to inability to eat as evidenced by NPO status.   GOAL:   Patient will meet greater than or equal to 90% of their needs   MONITOR:   Labs, I & O's, Weight trends, Vent status, TF tolerance  REASON FOR ASSESSMENT:   Consult Enteral/tube feeding initiation and management  ASSESSMENT:   Pt with a PMH significant for DM, HTN, emphysema presented with sore throat, difficulty swallowing, and mild stridor. Pt admitted with acute hypoxic respiratory failure with compromised airway in setting of acute epiglottis  1/26 - pt intubated   Medications reviewed and include: Decadron, SSI, Novolog, Levemir  Labs reviewed: CBGs 114-145  Pt currently receiving TF via OG tube with Vital High Protein at goal rate of 40 ml/h (960 ml per day) and Prostat 30 ml BID. Discussed pt with RN, tube feeding not started yet.   UOP: 2,260mL x24 hours I/O: +332.33mL since admit OG output: x 24 hours   Patient is currently intubated on ventilator support MV: 15.2 L/min Temp (24hrs), Avg:98.1 F (36.7 C), Min:97.7 F (36.5 C), Max:98.7 F (37.1 C)  Propofol: 11.03 ml/hr, providing 291 kcals  NUTRITION - FOCUSED PHYSICAL EXAM:    Most Recent Value  Orbital Region  No depletion  Upper Arm Region  Mild depletion  Thoracic and Lumbar Region  No depletion  Buccal Region  Unable to assess  Temple Region  Moderate depletion  Clavicle Bone Region  Mild depletion  Clavicle and Acromion Bone Region  Mild depletion  Scapular Bone Region  Mild depletion  Dorsal Hand  Unable to assess  Patellar Region   Mild depletion  Anterior Thigh Region  Mild depletion  Posterior Calf Region  Mild depletion  Edema (RD Assessment)  None  Hair  Reviewed  Eyes  Reviewed  Mouth  Reviewed  Skin  Reviewed  Nails  Reviewed       Diet Order:   Diet Order            Diet NPO time specified  Diet effective now              EDUCATION NEEDS:   Not appropriate for education at this time  Skin:  Skin Assessment: Reviewed RN Assessment  Last BM:  unknown  Height:   Ht Readings from Last 1 Encounters:  08/18/19 5\' 5"  (1.651 m)    Weight:   Wt Readings from Last 1 Encounters:  08/19/19 72.9 kg    Ideal Body Weight:  61.81 kg  BMI:  Body mass index is 26.74 kg/m.  Estimated Nutritional Needs:   Kcal:  1880  Protein:  90-105 grams  Fluid:  >1.8L/d    08/21/19, MS, RD, LDN Pager: [phone] Weekend/After Hours Pager: [phone]

## 2019-08-19 NOTE — Progress Notes (Signed)
CRITICAL CARE MEDICINE  NAME:  Ronald Brady, MRN:  [id number], DOB:  1960-11-24, LOS: 1 ADMISSION DATE:  08/18/2019, CONSULTATION DATE:  08/19/19 REFERRING MD:  Dr. Hyacinth Meeker, CHIEF COMPLAINT:  Sore throat/epiglotitis  Brief History   Patient is a 59yo male with PMHx of emphysema, tobacco use disorder, hypertension and type 2 diabetes mellitus presenting to [facility] ED with one day of progressively worsening sore throat, dysphagia and voice changes. No recent  fevers, chills, nausea, vomiting, diarrhea or recent sick contacts. Patient noted to have mild tachycardia and erythematous pharynx. Work up significant for severe epiglottitis on CT soft tissue of neck without any obvious abscess formation. Patient intubated for airway protection. He was given one dose of steroids, rocephin and placed on propofol gtt. He was transferred to [facility] ICU.   Past Medical History   Past Medical History:  Diagnosis Date  . Diabetes mellitus without complication (HCC)   . Emphysema lung (HCC)   . Hypertension    Significant Hospital Events   1/26: intubated and transferred to ICU from [facility] ED  Consults:  PCCM  Procedures:  ETT 1/26 >>  Significant Diagnostic Tests:  CT Soft Tissue Neck w Contrast (1/26)  Epiglottitis with marked swelling and severe airway narrowing Emphysema   Micro Data:  1/26 Group A Strep> negative 1/26 SARS CoV-2/Influenza A&B > negative 1/26 RVP> negative 1/26 HIV> negative 1/26 Blood cx>  Antimicrobials:  Ceftriaxone 1/26 >>   Interim history/subjective:  No acute overnight events. Patient remains intubated and sedated on propofol and fentanyl gtt.   Objective   Blood pressure 109/77, pulse 70, temperature 97.9 F (36.6 C), temperature source Oral, resp. rate 14, height 5\' 5"  (1.651 m), weight 72.9 kg, SpO2 98 %.    Vent Mode: PRVC FiO2 (%):  [50 %-100 %] 50 % Set Rate:  [14 bmp] 14 bmp Vt Set:  [490 mL] 490 mL PEEP:  [5 cmH20] 5 cmH20 Plateau  Pressure:  [12 cmH20-16 cmH20] 14 cmH20   Intake/Output Summary (Last 24 hours) at 08/19/2019 0645 Last data filed at 08/19/2019 0600 Gross per 24 hour  Intake 2375.27 ml  Output 2275 ml  Net 100.27 ml   Filed Weights   08/18/19 0709 08/18/19 1400 08/19/19 0400  Weight: 73.5 kg 73.1 kg 72.9 kg    Examination: General: healthy appearing male, intubated and sedated in NAD HENT: normocephalic, atraumatic, MM pink/moist; intubated Lungs: Clear to auscultation bilaterally, Synchronous with vent on FiO2 50%, PEEP 5 Cardiovascular: S1 and S2 appreciated, RRR, no murmurs, rubs or gallops Abdomen: soft, nondistended, normoactive bowel sounds Extremities: warm/dry, nonedematous Neuro: sedated GU: foley in place  Resolved Hospital Problem list    Assessment & Plan:  59yo male w/Hx of DMII, emphysema, tobacco use disorder, and HTN presenting with severe epiglottitis s/p intubation.   Acute hypoxic respiratory failure 2/2 compromised airway in setting of severe epiglottitis s/p intubation Patient presented to [facility] ED on 1/26 with progressively worsening sore throat, dysphagia and voice changes for one day. Labs significant for neutrophil predominant leukocytosis. Imaging with severe epiglottitis for which patient was intubated for airway protection at [facility] ED. He was given one dose of rocephin and steroids. Subsequently transferred to [facility] ICU. Also noted to have lactic acidosis.  Patient remains sedated and intubated and synchronous with vent on FiO2 50%, PEEP 5. HIV and RVP negative.  Discussed with  ENT and recommended for continued medical management with vent support, antibiotics and steroids for inflammation for 48-72  hours. He has received 2 doses of steroids.  Plan:  - Continue full vent support, goal SpO2 88-92% - Ceftriaxone daily (tentative end date 08/23/2018) - f/u Blood cx  - ENT to assess if difficult to extubate   Poorly controlled DMII HbA1c 11.8. On levemir 23U bid  and Humalog 16U tid w/meals at home. UA with glucosuria, hematuria and mild ketonuria, likely in setting of poorly controlled DMII.  Plan:  - CBG monitoring q4h - Levemir 23U qHS + qHS coverage - Novolog 5U q4h + SSI resistant  Centrilobular emphysema w/Hx of COPD Patient w/hx of emphysema on spiriva, symbicort and proventil at home. Currently on mechanical support 2/2 severe epiglottitis.  Plan: - Continue full vent support - Duonebs prn  Acute metabolic encephalopathy Patient w/hx of depression on cymbalta and trazodone at home. Currently sedated with propofol and fentanyl gtt. Plan: - RASS goal 0 to -1  Best practice:  Diet: tube feeds  Pain/Anxiety/Delirium protocol (if indicated): propofol, fentanyl VAP protocol (if indicated): per protocol DVT prophylaxis: Lovenox, SCD's GI prophylaxis: Protonix Glucose control: Levemir 23U, Novolog 5U + SSI, qHS coverage Mobility: Bedbound Code Status: FULL Family Communication: daughter updated daily  Disposition: ICU  Labs   CBC: Recent Labs  Lab 08/18/19 0728 08/19/19 0314  WBC 21.5* 14.8*  NEUTROABS 19.0*  --   HGB 17.4* 14.2  HCT 51.1 42.2  MCV 87.8 88.7  PLT 191 147*    Basic Metabolic Panel: Recent Labs  Lab 08/18/19 0728 08/19/19 0314  NA 133* 139  K 4.5 3.8  CL 99 106  CO2 23 24  GLUCOSE 461* 143*  BUN 15 19  CREATININE 0.96 0.75  CALCIUM 9.5 9.0  MG  --  2.2  PHOS  --  3.5   GFR: Estimated Creatinine Clearance: 88.6 mL/min (by C-G formula based on SCr of 0.75 mg/dL). Recent Labs  Lab 08/18/19 0728 08/18/19 1500 08/18/19 1801 08/19/19 0314  WBC 21.5*  --   --  14.8*  LATICACIDVEN  --  2.1* 2.1*  --     Liver Function Tests: No results for input(s): AST, ALT, ALKPHOS, BILITOT, PROT, ALBUMIN in the last 168 hours. No results for input(s): LIPASE, AMYLASE in the last 168 hours. No results for input(s): AMMONIA in the last 168 hours.  ABG No results found for: PHART, PCO2ART, PO2ART, HCO3, TCO2,  ACIDBASEDEF, O2SAT   Coagulation Profile: No results for input(s): INR, PROTIME in the last 168 hours.  Cardiac Enzymes: No results for input(s): CKTOTAL, CKMB, CKMBINDEX, TROPONINI in the last 168 hours.  HbA1C: Hgb A1c MFr Bld  Date/Time Value Ref Range Status  08/18/2019 02:42 PM 11.8 (H) 4.8 - 5.6 % Final    Comment:    (NOTE) Pre diabetes:          5.7%-6.4% Diabetes:              >6.4% Glycemic control for   <7.0% adults with diabetes     CBG: Recent Labs  Lab 08/18/19 1533 08/18/19 1938 08/18/19 2206 08/18/19 2357 08/19/19 0409  GLUCAP 388* 309* 293* 287* 114*    Critical care time:     Harvie Heck, MD Internal Medicine, PGY-1 Pager # [phone] 08/19/19  6:45 AM   Please see Attending Attestation for final assessment and plan/recommendations.

## 2019-08-20 LAB — GLUCOSE, CAPILLARY
Glucose-Capillary: 151 mg/dL — ABNORMAL HIGH (ref 70–99)
Glucose-Capillary: 98 mg/dL (ref 70–99)
Glucose-Capillary: 51 mg/dL — ABNORMAL LOW (ref 70–99)
Glucose-Capillary: 172 mg/dL — ABNORMAL HIGH (ref 70–99)
Glucose-Capillary: 94 mg/dL (ref 70–99)
Glucose-Capillary: 102 mg/dL — ABNORMAL HIGH (ref 70–99)
Glucose-Capillary: 95 mg/dL (ref 70–99)

## 2019-08-20 LAB — CBC
HCT: 38.3 % — ABNORMAL LOW (ref 39.0–52.0)
Hemoglobin: 12.8 g/dL — ABNORMAL LOW (ref 13.0–17.0)
MCH: 29.9 pg (ref 26.0–34.0)
MCHC: 33.4 g/dL (ref 30.0–36.0)
MCV: 89.5 fL (ref 80.0–100.0)
Platelets: 152 10*3/uL (ref 150–400)
RBC: 4.28 MIL/uL (ref 4.22–5.81)
RDW: 12.8 % (ref 11.5–15.5)
WBC: 12.4 10*3/uL — ABNORMAL HIGH (ref 4.0–10.5)
nRBC: 0 % (ref 0.0–0.2)

## 2019-08-20 LAB — BASIC METABOLIC PANEL
Anion gap: 6 (ref 5–15)
BUN: 25 mg/dL — ABNORMAL HIGH (ref 6–20)
CO2: 25 mmol/L (ref 22–32)
Calcium: 8.6 mg/dL — ABNORMAL LOW (ref 8.9–10.3)
Chloride: 108 mmol/L (ref 98–111)
Creatinine, Ser: 0.75 mg/dL (ref 0.61–1.24)
GFR calc Af Amer: >60 mL/min (ref >60)
GFR calc non Af Amer: >60 mL/min (ref >60)
Glucose, Bld: 149 mg/dL — ABNORMAL HIGH (ref 70–99)
Potassium: 3.9 mmol/L (ref 3.5–5.1)
Sodium: 139 mmol/L (ref 135–145)

## 2019-08-20 LAB — TRIGLYCERIDES: Triglycerides: 51 mg/dL (ref <150)

## 2019-08-20 MED ORDER — TRAZODONE HCL 50 MG PO TABS
100.0000 mg | ORAL_TABLET | Freq: Every day | ORAL | Status: DC
  Administered 2019-08-21: 23:00:00 100 mg via ORAL
  Filled 2019-08-20: qty 2

## 2019-08-20 MED ORDER — FENTANYL CITRATE (PF) 100 MCG/2ML IJ SOLN
50.0000 ug | INTRAMUSCULAR | Status: DC | PRN
  Administered 2019-08-20 – 2019-08-21 (×4): 50 ug via INTRAVENOUS
  Filled 2019-08-20 (×4): qty 2

## 2019-08-20 MED ORDER — DEXTROSE 50 % IV SOLN
INTRAVENOUS | Status: AC
  Administered 2019-08-20: 50 mL
  Filled 2019-08-20: qty 50

## 2019-08-20 MED ORDER — LORAZEPAM 2 MG/ML IJ SOLN
1.0000 mg | INTRAMUSCULAR | Status: DC | PRN
  Administered 2019-08-20 (×2): 1 mg via INTRAVENOUS
  Filled 2019-08-20: qty 1

## 2019-08-20 MED ORDER — DULOXETINE HCL 60 MG PO CPEP
60.0000 mg | ORAL_CAPSULE | Freq: Every day | ORAL | Status: DC
  Administered 2019-08-22: 10:00:00 60 mg via ORAL
  Filled 2019-08-20: qty 2
  Filled 2019-08-20: qty 1

## 2019-08-20 NOTE — Evaluation (Signed)
Clinical/Bedside Swallow Evaluation Patient Details  Name: Ronald Brady MRN: [id number] Date of Birth: 02/22/1961  Today's Date: 08/20/2019 Time: SLP Start Time (ACUTE ONLY): 1122 SLP Stop Time (ACUTE ONLY): 1136 SLP Time Calculation (min) (ACUTE ONLY): 14 min  Past Medical History:  Past Medical History:  Diagnosis Date  . Diabetes mellitus without complication (HCC)   . Emphysema lung (HCC)   . Hypertension    Past Surgical History:  Past Surgical History:  Procedure Laterality Date  . ABDOMINAL SURGERY     HPI:  59 yo male presented to [facility] with sore throat, difficulty swallowing, and mild stridor.  CT neck showed epiglottitis with severe narrowing of airway.  Intubated in ER and transferred to [facility] for further management. ETT 1/26-28.  Encephalopathy, DM type 2.    Assessment / Plan / Recommendation Clinical Impression  Pt presents with a post-extubation dysphagia with consistent s/s of aspiration with all PO trials (ice, water, applesauce) - pt demonstrates great effort to initiate a swallow, followed by multiple sub-swallows with each bolus (suggesting poor transition); cough is wet and explosive.  He self-suctions to manage secretions.  Voice remains wet/hoarse.  Recommend continuing NPO for today (except occasional ice chips after oral care); SLP will f/u next date for improvements.  Improvements will be dependent upon edema of epiglottis.  D/W pt (who was confused) and RN, Bonita Quin. SLP Visit Diagnosis: Dysphagia, unspecified (R13.10)    Aspiration Risk  Moderate aspiration risk    Diet Recommendation   npo except occasional ice chips       Other  Recommendations Oral Care Recommendations: Oral care prior to ice chip/H20   Follow up Recommendations Other (comment)(tba)      Frequency and Duration min 3x week  2 weeks       Prognosis Prognosis for Safe Diet Advancement: Good      Swallow Study   General Date of Onset: 08/18/19 HPI: 59 yo male presented to [facility]  with sore throat, difficulty swallowing, and mild stridor.  CT neck showed epiglottitis with severe narrowing of airway.  Intubated in ER and transferred to [facility] for further management. ETT 1/26-28.  Encephalopathy, DM type 2.  Type of Study: Bedside Swallow Evaluation Diet Prior to this Study: NPO Temperature Spikes Noted: No Respiratory Status: Nasal cannula History of Recent Intubation: Yes Length of Intubations (days): 2 days Date extubated: 08/20/19 Behavior/Cognition: Alert;Confused Oral Cavity Assessment: Within Functional Limits Oral Cavity - Dentition: Edentulous Vision: Functional for self-feeding Self-Feeding Abilities: Needs assist Patient Positioning: Upright in bed Baseline Vocal Quality: Wet;Hoarse Volitional Cough: Strong Volitional Swallow: Able to elicit    Oral/Motor/Sensory Function Overall Oral Motor/Sensory Function: Within functional limits   Ice Chips Ice chips: Impaired Presentation: Spoon Pharyngeal Phase Impairments: Wet Vocal Quality;Cough - Immediate   Thin Liquid Thin Liquid: Impaired Presentation: Spoon Pharyngeal  Phase Impairments: Wet Vocal Quality;Cough - Immediate    Nectar Thick Nectar Thick Liquid: Not tested   Honey Thick Honey Thick Liquid: Not tested   Puree Puree: Impaired Presentation: Spoon Pharyngeal Phase Impairments: Multiple swallows;Cough - Immediate;Wet Vocal Quality   Solid     Solid: Not tested      Blenda Mounts Laurice 08/20/2019,11:46 AM   Marchelle Folks L. Samson Frederic, MA CCC/SLP Acute Rehabilitation Services Office number [phone] Pager [phone]

## 2019-08-20 NOTE — Progress Notes (Addendum)
Inpatient Diabetes Program Recommendations  AACE/ADA: New Consensus Statement on Inpatient Glycemic Control   Target Ranges:  Prepandial:   less than 140 mg/dL      Peak postprandial:   less than 180 mg/dL (1-2 hours)      Critically ill patients:  140 - 180 mg/dL   Results for Ronald Brady, Ronald Brady (MRN [id number]) as of 08/20/2019 13:44  Ref. Range 08/19/2019 07:18 08/19/2019 11:17 08/19/2019 15:19 08/19/2019 19:25 08/19/2019 23:26 08/20/2019 03:21 08/20/2019 07:24 08/20/2019 11:10 08/20/2019 11:34  Glucose-Capillary Latest Ref Range: 70 - 99 mg/dL 829 (H)  Novolog 3 units  Decadron 10 mg 124 (H)  Novolog 8 units  142 (H)  Novolog 8 units  109 (H)  Novolog 5 units  Levemir 23 units @ 21:16 137 (H)  Novolog 8 units 151 (H)  Novolog 9 units 98  Novolog 5 units@8 :03   Tube Feeding stopped @9am  51 (L) 172 (H)   Review of Glycemic Control  Diabetes history: DM2 Outpatient Diabetes medications: Levemir 23 units BID, Humalog 16 units TID with meals, Januvia 50 mg daily Current orders for Inpatient glycemic control: Novolog 5 units Q4H, Novolog 0-20 units Q4H  Inpatient Diabetes Program Recommendations:   Insulin - Basal: Noted Levemir was discontinued today. Levemir 23 units given last night at 21:16 last night.  Insulin - Tube Feeding Coverage: Patient is ordered Novolog 5 units Q4H for tube feeding coverage but tube feeding has been discontinued. Hypoglycemia of 51 mg/dl at today likely from getting Novolog 5 units at 8:03am and tube feeding being stopped around 9am. If tube feeding will not be restarted, please discontinue Novolog 5 units Q4H.  NOTE: Noted steroids are no longer ordered and tube feeding has been stopped.   Addendum 08/21/19@14 :38-Spoke with patient over the phone about diabetes and home regimen for diabetes control. Patient reports being followed by PCP for diabetes management and currently taking Levemir 23 units BID, Humalog 16 units TID with meals, and Januvia  50 mg daily as an outpatient for diabetes control. Patient reports taking DM medications as prescribed and that no changes with DM medications have been made lately.  Patient reports that glucose at home is running in 200-300's mg/dl and was over 08/23/19 mg/dl prior to coming to the hospital.  Inquired about prior A1C and patient reports not being able to recall last A1C value. Discussed A1C results (11.8% on 08/18/19) and explained that current A1C indicates an average glucose of 292 mg/dl over the past 2-3 months. Discussed glucose and A1C goals. Discussed importance of checking CBGs and maintaining good CBG control to prevent long-term and short-term complications. Explained how hyperglycemia leads to damage within blood vessels which lead to the common complications seen with uncontrolled diabetes. Stressed to the patient the importance of improving glycemic control to prevent further complications from uncontrolled diabetes. Discussed impact of nutrition, exercise, stress, sickness, and medications on diabetes control.  Encouraged patient to check glucose 4 times per day (before meals and at bedtime) and to keep a log book of glucose readings.  Encouraged patient to follow up with is PCP regarding DM control and if DM continues to be uncontrolled encouraged patient to ask PCP about referring him to an Endocrinologist (Dr. 08/20/19 is located in [place]).   Patient verbalized understanding of information discussed and reports no further questions at this time related to diabetes.  Thanks, Garrison, RN, MSN, CDE Diabetes Coordinator Inpatient Diabetes Program [phone] (Team Pager from 8am to 5pm)

## 2019-08-20 NOTE — Procedures (Signed)
Extubation Procedure Note  Patient Details:   Name: Ronald Brady DOB: 1960/09/25 MRN: [id number]   Airway Documentation:    Vent end date: 08/20/19 Vent end time: 0935   Evaluation  O2 sats: stable throughout Complications: No apparent complications Patient did tolerate procedure well. Bilateral Breath Sounds: Clear, Diminished   Yes  4l/min  Incentive spirometer instructed  Newt Lukes 08/20/2019, 9:37 AM

## 2019-08-20 NOTE — Evaluation (Signed)
Physical Therapy Evaluation Patient Details Name: Ronald Brady MRN: [id number] DOB: 1961/02/26 Today's Date: 08/20/2019   History of Present Illness  59 yo male presented to [facility] with sore throat, difficulty swallowing, and mild stridor.  CT neck showed epiglottitis with severe narrowing of airway.  Intubated in ER and transferred to [facility] for further management. ETT 1/26-28.  Encephalopathy, DM type 2.  Clinical Impression  Patient presents with decreased mobility due to generalized weakness, decreased balance, decreased activity tolerance and will benefit from skilled PT in the acute setting to allow return home with family support.  Would benefit from HHPT if able to get via charity care.      Follow Up Recommendations Supervision for mobility/OOB;Home health PT    Equipment Recommendations  Other (comment)(TBA; may have a walker at home)    Recommendations for Other Services       Precautions / Restrictions Precautions Precautions: Fall      Mobility  Bed Mobility Overal bed mobility: Needs Assistance Bed Mobility: Supine to Sit     Supine to sit: HOB elevated;Supervision     General bed mobility comments: increased time, redirection several times to task, assist for lines  Transfers Overall transfer level: Needs assistance Equipment used: 1 person hand held assist Transfers: Sit to/from Stand Sit to Stand: Min assist         General transfer comment: assist to steady and for lines  Ambulation/Gait Ambulation/Gait assistance: Min assist Gait Distance (Feet): 25 Feet Assistive device: 1 person hand held assist Gait Pattern/deviations: Step-to pattern;Step-through pattern;Decreased stride length;Antalgic     General Gait Details: antalgic on R and pt using footboard to support his other hand, needs assist R HHA for balance/safety  Stairs            Wheelchair Mobility    Modified Rankin (Stroke Patients Only)       Balance Overall balance  assessment: Needs assistance   Sitting balance-Leahy Scale: Good     Standing balance support: Single extremity supported Standing balance-Leahy Scale: Poor Standing balance comment: UE support for balance today                             Pertinent Vitals/Pain Pain Assessment: Faces Faces Pain Scale: Hurts even more Pain Location: throat Pain Descriptors / Indicators: Grimacing;Sore Pain Intervention(s): Monitored during session;Patient requesting pain meds-RN notified    Home Living Family/patient expects to be discharged to:: Private residence Living Arrangements: Children;Alone(iniitally stated lives alone next door to daughter then stated lives with his daughter) Available Help at Discharge: Family;Available PRN/intermittently Type of Home: Mobile home Home Access: Stairs to enter   Entrance Stairs-Number of Steps: 3 Home Layout: One level Home Equipment: Cane - single point Additional Comments: reports has "bad leg" on R and uses cane intermittently    Prior Function Level of Independence: Independent with assistive device(s)               Hand Dominance        Extremity/Trunk Assessment   Upper Extremity Assessment Upper Extremity Assessment: Generalized weakness    Lower Extremity Assessment Lower Extremity Assessment: RLE deficits/detail RLE Deficits / Details: reports unable to fully straighten the knee, noted some errythema about the knee, limps with ambulation, but able to lift leg antigravity RLE Sensation: WNL       Communication   Communication: Expressive difficulties(whispers)  Cognition Arousal/Alertness: Awake/alert Behavior During Therapy: Anxious Overall Cognitive Status: No family/caregiver  present to determine baseline cognitive functioning Area of Impairment: Orientation;Attention;Memory;Following commands;Safety/judgement                 Orientation Level: Place;Time Current Attention Level: Sustained Memory:  Decreased short-term memory Following Commands: Follows one step commands consistently;Follows one step commands with increased time Safety/Judgement: Decreased awareness of deficits     General Comments: thought he was at [facility] on Tuesday; became somewhat paranoid stating I don't believe this could happen to someone cause of strep throat, I think ya'll kidnapped me, but reassured and pt verbalized understanding      General Comments General comments (skin integrity, edema, etc.): VSS maintained on 4LO2; perseverating on calling his daughter; concerned she doesn't know where he is    Exercises     Assessment/Plan    PT Assessment Patient needs continued PT services  PT Problem List Decreased strength;Decreased activity tolerance;Decreased balance;Decreased mobility;Decreased safety awareness;Decreased knowledge of use of DME;Pain       PT Treatment Interventions DME instruction;Stair training;Therapeutic activities;Balance training;Therapeutic exercise;Functional mobility training;Gait training;Patient/family education    PT Goals (Current goals can be found in the Care Plan section)  Acute Rehab PT Goals Patient Stated Goal: to go home PT Goal Formulation: With patient Time For Goal Achievement: 09/03/19 Potential to Achieve Goals: Good    Frequency Min 3X/week   Barriers to discharge        Co-evaluation               AM-PAC PT "6 Clicks" Mobility  Outcome Measure Help needed turning from your back to your side while in a flat bed without using bedrails?: A Little Help needed moving from lying on your back to sitting on the side of a flat bed without using bedrails?: A Little Help needed moving to and from a bed to a chair (including a wheelchair)?: A Little Help needed standing up from a chair using your arms (e.g., wheelchair or bedside chair)?: A Little Help needed to walk in hospital room?: A Little Help needed climbing 3-5 steps with a railing? : A  Little 6 Click Score: 18    End of Session Equipment Utilized During Treatment: Oxygen Activity Tolerance: Patient limited by fatigue Patient left: in chair;with call bell/phone within reach;with chair alarm set Nurse Communication: Mobility status;Patient requests pain meds PT Visit Diagnosis: Other abnormalities of gait and mobility (R26.89);Difficulty in walking, not elsewhere classified (R26.2)    Time: 1610-9604 PT Time Calculation (min) (ACUTE ONLY): 23 min   Charges:   PT Evaluation $PT Eval Moderate Complexity: 1 Mod PT Treatments $Gait Training: 8-22 mins        Sheran Lawless, [place] Acute Rehabilitation Services [phone] 08/20/2019   Elray Mcgregor 08/20/2019, 3:47 PM

## 2019-08-20 NOTE — Progress Notes (Signed)
CRITICAL CARE MEDICINE  NAME:  Ronald Brady, MRN:  [id number], DOB:  [birth year], LOS: 2 ADMISSION DATE:  08/18/2019, CONSULTATION DATE:  08/20/19 REFERRING MD:  Dr. Hyacinth Meeker, CHIEF COMPLAINT:  Sore throat/epiglotitis  Brief History   Patient is a 59yo male with PMHx of emphysema, tobacco use disorder, hypertension and type 2 diabetes mellitus presenting to [facility] ED with one day of progressively worsening sore throat, dysphagia and voice changes. No recent  fevers, chills, nausea, vomiting, diarrhea or recent sick contacts. Patient noted to have mild tachycardia and erythematous pharynx. Work up significant for severe epiglottitis on CT soft tissue of neck without any obvious abscess formation. Patient intubated for airway protection. He was given one dose of steroids, rocephin and placed on propofol gtt. He was transferred to [facility] ICU.   Past Medical History   Past Medical History:  Diagnosis Date  . Diabetes mellitus without complication (HCC)   . Emphysema lung (HCC)   . Hypertension    Significant Hospital Events   1/26: intubated and transferred to ICU from [facility] ED  Consults:  PCCM  Procedures:  ETT 1/26 >>  Significant Diagnostic Tests:  CT Soft Tissue Neck w Contrast (1/26)  Epiglottitis with marked swelling and severe airway narrowing Emphysema   Micro Data:  1/26 Group A Strep> negative 1/26 SARS CoV-2/Influenza A&B > negative 1/26 RVP> negative 1/26 HIV> negative 1/26 Blood cx>  Antimicrobials:  Ceftriaxone 1/26 >>   Interim history/subjective:  No acute overnight events. Patient remains intubated and sedated on propofol and fentanyl gtt.   Objective   Blood pressure 90/65, pulse 64, temperature 98.3 F (36.8 C), temperature source Oral, resp. rate 16, height 5\' 5"  (1.651 m), weight 76.7 kg, SpO2 100 %.    Vent Mode: PRVC FiO2 (%):  [40 %] 40 % Set Rate:  [14 bmp] 14 bmp Vt Set:  [490 mL] 490 mL PEEP:  [5 cmH20] 5 cmH20 Pressure Support:   [8 cmH20] 8 cmH20 Plateau Pressure:  [16 cmH20] 16 cmH20   Intake/Output Summary (Last 24 hours) at 08/20/2019 08/22/2019 Last data filed at 08/20/2019 0600 Gross per 24 hour  Intake 4886.72 ml  Output 1035 ml  Net 3851.72 ml   Filed Weights   08/18/19 1400 08/19/19 0400 08/20/19 0500  Weight: 73.1 kg 72.9 kg 76.7 kg    Examination: General: healthy appearing male, intubated and sedated in NAD HENT: normocephalic, atraumatic, MM pink/moist; intubated Lungs: Clear to auscultation bilaterally, Synchronous with vent on FiO2 50%, PEEP 5 Cardiovascular: S1 and S2 appreciated, RRR, no murmurs, rubs or gallops Abdomen: soft, nondistended, normoactive bowel sounds Extremities: warm/dry, nonedematous Neuro: sedated GU: foley in place  Resolved Hospital Problem list   Acute metabolic encephalopathy  Assessment & Plan:  59yo male w/Hx of DMII, emphysema, tobacco use disorder, and HTN presenting with severe epiglottitis s/p intubation.   Acute hypoxic respiratory failure 2/2 compromised airway in setting of severe epiglottitis s/p intubation Patient presented to [facility] ED on 1/26 with progressively worsening sore throat, dysphagia and voice changes for one day. Labs significant for neutrophil predominant leukocytosis. Imaging with severe epiglottitis for which patient was intubated for airway protection at [facility] ED. He was given one dose of rocephin and steroids. Subsequently transferred to [facility] ICU. Discussed with  ENT and recommended for continued medical management with vent support, antibiotics and steroids for inflammation for 48-72 hours. He has received 2 doses of steroids.  Patient remains sedated and intubated and synchronous with vent  on FiO2 50%, PEEP 5. HIV and RVP negative. Leukocytosis improving  Plan:  - Assess for cuff leak prior to extubation  - Extubate today - Ceftriaxone daily (tentative end date 08/23/2018)  Poorly controlled DMII HbA1c 11.8. On levemir 23U bid and  Humalog 16U tid w/meals at home. UA with glucosuria, hematuria and mild ketonuria, likely in setting of poorly controlled DMII.  Plan:  - CBG monitoring q4h - Levemir 23U qHS + qHS coverage - Novolog 5U q4h + SSI resistant  Centrilobular emphysema w/Hx of COPD Patient w/hx of emphysema on spiriva, symbicort and proventil at home. Currently on mechanical support 2/2 severe epiglottitis.  Plan: - Extubate today - Duonebs prn  Acute metabolic encephalopathy Patient w/hx of depression on cymbalta and trazodone at home. He was sedated on fentanyl and propofol gtt during intubation. This morning, patient to be extubated  Plan: - Resume home meds following extubation   Best practice:  Diet: Carb modified Pain/Anxiety/Delirium protocol (if indicated): n/a VAP protocol (if indicated): n/a DVT prophylaxis: Lovenox, SCD's GI prophylaxis: Protonix Glucose control: Levemir 23U, Novolog 5U + SSI, qHS coverage Mobility: OOB as tolerated Code Status: FULL Family Communication: daughter updated daily  Disposition: ICU  Labs   CBC: Recent Labs  Lab 08/18/19 0728 08/19/19 0314 08/20/19 0239  WBC 21.5* 14.8* 12.4*  NEUTROABS 19.0*  --   --   HGB 17.4* 14.2 12.8*  HCT 51.1 42.2 38.3*  MCV 87.8 88.7 89.5  PLT 191 147* 902    Basic Metabolic Panel: Recent Labs  Lab 08/18/19 0728 08/19/19 0314 08/20/19 0239  NA 133* 139 139  K 4.5 3.8 3.9  CL 99 106 108  CO2 23 24 25   GLUCOSE 461* 143* 149*  BUN 15 19 25*  CREATININE 0.96 0.75 0.75  CALCIUM 9.5 9.0 8.6*  MG  --  2.2  --   PHOS  --  3.5  --    GFR: Estimated Creatinine Clearance: 97.4 mL/min (by C-G formula based on SCr of 0.75 mg/dL). Recent Labs  Lab 08/18/19 0728 08/18/19 1500 08/18/19 1801 08/19/19 0314 08/20/19 0239  WBC 21.5*  --   --  14.8* 12.4*  LATICACIDVEN  --  2.1* 2.1*  --   --     Liver Function Tests: No results for input(s): AST, ALT, ALKPHOS, BILITOT, PROT, ALBUMIN in the last 168 hours. No results  for input(s): LIPASE, AMYLASE in the last 168 hours. No results for input(s): AMMONIA in the last 168 hours.  ABG No results found for: PHART, PCO2ART, PO2ART, HCO3, TCO2, ACIDBASEDEF, O2SAT   Coagulation Profile: No results for input(s): INR, PROTIME in the last 168 hours.  Cardiac Enzymes: No results for input(s): CKTOTAL, CKMB, CKMBINDEX, TROPONINI in the last 168 hours.  HbA1C: Hgb A1c MFr Bld  Date/Time Value Ref Range Status  08/18/2019 02:42 PM 11.8 (H) 4.8 - 5.6 % Final    Comment:    (NOTE) Pre diabetes:          5.7%-6.4% Diabetes:              >6.4% Glycemic control for   <7.0% adults with diabetes     CBG: Recent Labs  Lab 08/19/19 1519 08/19/19 1925 08/19/19 2326 08/20/19 0321 08/20/19 0724  GLUCAP 142* 109* 137* 151* 98    Critical care time:     Harvie Heck, MD Internal Medicine, PGY-1 Pager # [phone] 08/20/19  7:42 AM   Please see Attending Attestation for final assessment and plan/recommendations.

## 2019-08-21 ENCOUNTER — Inpatient Hospital Stay (HOSPITAL_COMMUNITY): Payer: Medicaid Other

## 2019-08-21 LAB — CBC
HCT: 39.4 % (ref 39.0–52.0)
Hemoglobin: 13.2 g/dL (ref 13.0–17.0)
MCH: 29.5 pg (ref 26.0–34.0)
MCHC: 33.5 g/dL (ref 30.0–36.0)
MCV: 87.9 fL (ref 80.0–100.0)
Platelets: 137 10*3/uL — ABNORMAL LOW (ref 150–400)
RBC: 4.48 MIL/uL (ref 4.22–5.81)
RDW: 12.4 % (ref 11.5–15.5)
WBC: 6.7 10*3/uL (ref 4.0–10.5)
nRBC: 0 % (ref 0.0–0.2)

## 2019-08-21 LAB — GLUCOSE, CAPILLARY
Glucose-Capillary: 89 mg/dL (ref 70–99)
Glucose-Capillary: 108 mg/dL — ABNORMAL HIGH (ref 70–99)
Glucose-Capillary: 134 mg/dL — ABNORMAL HIGH (ref 70–99)
Glucose-Capillary: 169 mg/dL — ABNORMAL HIGH (ref 70–99)
Glucose-Capillary: 220 mg/dL — ABNORMAL HIGH (ref 70–99)

## 2019-08-21 LAB — BASIC METABOLIC PANEL
Anion gap: 11 (ref 5–15)
BUN: 19 mg/dL (ref 6–20)
CO2: 24 mmol/L (ref 22–32)
Calcium: 8.1 mg/dL — ABNORMAL LOW (ref 8.9–10.3)
Chloride: 102 mmol/L (ref 98–111)
Creatinine, Ser: 0.8 mg/dL (ref 0.61–1.24)
GFR calc Af Amer: >60 mL/min (ref >60)
GFR calc non Af Amer: >60 mL/min (ref >60)
Glucose, Bld: 124 mg/dL — ABNORMAL HIGH (ref 70–99)
Potassium: 3.3 mmol/L — ABNORMAL LOW (ref 3.5–5.1)
Sodium: 137 mmol/L (ref 135–145)

## 2019-08-21 MED ORDER — ROCURONIUM BROMIDE 10 MG/ML (PF) SYRINGE
PREFILLED_SYRINGE | INTRAVENOUS | Status: AC
  Filled 2019-08-21: qty 10

## 2019-08-21 MED ORDER — PROPOFOL 10 MG/ML IV BOLUS
INTRAVENOUS | Status: AC
  Filled 2019-08-21: qty 20

## 2019-08-21 MED ORDER — POTASSIUM CHLORIDE 10 MEQ/100ML IV SOLN
10.0000 meq | INTRAVENOUS | Status: AC
  Administered 2019-08-21 (×5): 10 meq via INTRAVENOUS
  Filled 2019-08-21 (×5): qty 100

## 2019-08-21 MED ORDER — RESOURCE THICKENUP CLEAR PO POWD
ORAL | Status: DC | PRN
  Filled 2019-08-21: qty 125

## 2019-08-21 MED ORDER — ORAL CARE MOUTH RINSE
15.0000 mL | Freq: Two times a day (BID) | OROMUCOSAL | Status: DC
  Administered 2019-08-21 – 2019-08-22 (×2): 15 mL via OROMUCOSAL

## 2019-08-21 MED ORDER — ETOMIDATE 2 MG/ML IV SOLN
INTRAVENOUS | Status: AC
  Filled 2019-08-21: qty 10

## 2019-08-21 NOTE — Progress Notes (Addendum)
CRITICAL CARE MEDICINE  NAME:  Ronald Brady, MRN:  [id number], DOB:  [birth year], LOS: 3 ADMISSION DATE:  08/18/2019, CONSULTATION DATE:  08/21/19 REFERRING MD:  Dr. Sabra Heck, CHIEF COMPLAINT:  Sore throat/epiglotitis  Brief History   Patient is a 59yo male with PMHx of emphysema, tobacco use disorder, hypertension and type 2 diabetes mellitus presenting to [facility] ED with one day of progressively worsening sore throat, dysphagia and voice changes. No recent  fevers, chills, nausea, vomiting, diarrhea or recent sick contacts. Patient noted to have mild tachycardia and erythematous pharynx. Work up significant for severe epiglottitis on CT soft tissue of neck without any obvious abscess formation. Patient intubated for airway protection. He was given one dose of steroids, rocephin and placed on propofol gtt. He was transferred to [facility] ICU.   Past Medical History   Past Medical History:  Diagnosis Date  . Diabetes mellitus without complication ([place])   . Emphysema lung ([place])   . Hypertension    Significant Hospital Events   1/26: intubated and transferred to ICU from [facility] ED 1/27: remains intubated and sedated; continuing ceftriaxone 1/28: patient extubated to nasal cannula, tolerated well. SLP evaluation w/dysphagia; recommended for NPO except ice chips  Consults:  PCCM  Procedures:  ETT 1/26 >> 1/28  Significant Diagnostic Tests:  CT Soft Tissue Neck w Contrast (1/26)  Epiglottitis with marked swelling and severe airway narrowing Emphysema   Micro Data:  1/26 Group A Strep> negative 1/26 SARS CoV-2/Influenza A&B > negative 1/26 RVP> negative 1/26 HIV> negative 1/26 Blood cx> negative  Antimicrobials:  Ceftriaxone 1/26 >>   Interim history/subjective:  Patient extubated 1/28 without any complications. Overnight, no acute concerns. Patient maintaining saturation >95% on room air.  This morning, patient is awake, alert and interactive. He reports continued sore  throat and hoarseness but otherwise doing well.   Objective   Blood pressure (!) 144/82, pulse 78, temperature 97.9 F (36.6 C), temperature source Oral, resp. rate (!) 25, height 5\' 5"  (1.651 m), weight 70 kg, SpO2 97 %.     Intake/Output Summary (Last 24 hours) at 08/21/2019 0703 Last data filed at 08/21/2019 0600 Gross per 24 hour  Intake 2432.77 ml  Output 1359 ml  Net 1073.77 ml   Filed Weights   08/19/19 0400 08/20/19 0500 08/21/19 0500  Weight: 72.9 kg 76.7 kg 70 kg    Examination: General: chronically ill appearing male in no acute distress HENT: normocephalic, atraumatic, MM pink/moist; PERRL, EOMI Lungs: Clear to auscultation bilaterally Cardiovascular: S1 and S2 appreciated, RRR, no murmurs, rubs or gallops Abdomen: soft, nondistended, normoactive bowel sounds Extremities: warm/dry, nonedematous Neuro: awake, alert, oriented x4 Skin: no breakdown noted  Resolved Hospital Problem list   Acute metabolic encephalopathy Acute hypoxic respiratory failure in setting of compromised airway 2/2 severe epiglottitis   Assessment & Plan:  59yo male w/Hx of DMII, emphysema, tobacco use disorder, and HTN presenting with severe epiglottitis s/p intubation.   Severe epiglottitis  Patient presented to [facility] ED on 1/26 with progressively worsening sore throat, dysphagia and voice changes for one day. Labs significant for neutrophil predominant leukocytosis. Imaging with severe epiglottitis for which patient was intubated for airway protection at [facility] ED. He was given one dose of rocephin and steroids. Subsequently transferred to [facility] ICU. He received an additional dose of steroids and was continued on rocephin. Patient successfully extubated 1/28 to nasal cannula and transitioned to room air.  Plan:  - Ceftriaxone daily (tentative end date 08/23/2018) -  Hospitalist to assume care 08/22/19  Post-extubation dysphagia:  SLP yesterday with aspiration with all PO trials. Voice is  hoarse. Recommendation by speech pathologist to continue NPO except ice chips. Patient continues to endorse hoarseness and sore throat.  Plan:  -Continue SLP eval  - Continue to monitor   Poorly controlled DMII HbA1c 11.8. On levemir 23U bid and Humalog 16U tid w/meals at home. UA with glucosuria, hematuria and mild ketonuria, likely in setting of poorly controlled DMII.  Plan:  - CBG monitoring q4h - SSI - Advance insulin regimen as he starts tolerating diet   Centrilobular emphysema w/Hx of COPD Patient w/hx of emphysema on spiriva, symbicort and proventil at home.  Plan: - Duonebs prn  Acute metabolic encephalopathy Patient w/hx of depression on cymbalta and trazodone at home. He was sedated on fentanyl and propofol gtt during intubation. Patient extubated yesterday. Required 2 doses of ativan prn.  Plan: - Trazodone and cymbalta - Ativan prn  Best practice:  Diet: NPO Pain/Anxiety/Delirium protocol (if indicated): n/a VAP protocol (if indicated): n/a DVT prophylaxis: Lovenox, SCD's GI prophylaxis: N/A Glucose control: SSI Mobility: OOB as tolerated Code Status: FULL Family Communication: daughter updated daily  Disposition: Tele  Labs   CBC: Recent Labs  Lab 08/18/19 0728 08/19/19 0314 08/20/19 0239  WBC 21.5* 14.8* 12.4*  NEUTROABS 19.0*  --   --   HGB 17.4* 14.2 12.8*  HCT 51.1 42.2 38.3*  MCV 87.8 88.7 89.5  PLT 191 147* 152    Basic Metabolic Panel: Recent Labs  Lab 08/18/19 0728 08/19/19 0314 08/20/19 0239  NA 133* 139 139  K 4.5 3.8 3.9  CL 99 106 108  CO2 23 24 25   GLUCOSE 461* 143* 149*  BUN 15 19 25*  CREATININE 0.96 0.75 0.75  CALCIUM 9.5 9.0 8.6*  MG  --  2.2  --   PHOS  --  3.5  --    GFR: Estimated Creatinine Clearance: 88.6 mL/min (by C-G formula based on SCr of 0.75 mg/dL). Recent Labs  Lab 08/18/19 0728 08/18/19 1500 08/18/19 1801 08/19/19 0314 08/20/19 0239  WBC 21.5*  --   --  14.8* 12.4*  LATICACIDVEN  --  2.1* 2.1*   --   --     Liver Function Tests: No results for input(s): AST, ALT, ALKPHOS, BILITOT, PROT, ALBUMIN in the last 168 hours. No results for input(s): LIPASE, AMYLASE in the last 168 hours. No results for input(s): AMMONIA in the last 168 hours.  ABG No results found for: PHART, PCO2ART, PO2ART, HCO3, TCO2, ACIDBASEDEF, O2SAT   Coagulation Profile: No results for input(s): INR, PROTIME in the last 168 hours.  Cardiac Enzymes: No results for input(s): CKTOTAL, CKMB, CKMBINDEX, TROPONINI in the last 168 hours.  HbA1C: Hgb A1c MFr Bld  Date/Time Value Ref Range Status  08/18/2019 02:42 PM 11.8 (H) 4.8 - 5.6 % Final    Comment:    (NOTE) Pre diabetes:          5.7%-6.4% Diabetes:              >6.4% Glycemic control for   <7.0% adults with diabetes     CBG: Recent Labs  Lab 08/20/19 1134 08/20/19 1524 08/20/19 1936 08/20/19 2315 08/21/19 0316  GLUCAP 172* 94 102* 95 89    Critical care time:     08/23/19, MD Internal Medicine, PGY-1 Pager # [phone] 08/21/19  7:03 AM   Please see Attending Attestation for final assessment and plan/recommendations.

## 2019-08-21 NOTE — Progress Notes (Addendum)
  Briefly, Ronald Brady is a 59 year old man who was referred from [facility] hospital to [facility] critical care unit after he was intubated for respiratory failure secondary to acute epiglottitis.  He was extubated yesterday and he will be transferred out of the ICU tomorrow.  The hospitalist team was consulted to continue with medical management starting from tomorrow.  Case discussed with Dr. Mcarthur Rossetti.

## 2019-08-21 NOTE — Progress Notes (Signed)
Nutrition Follow-up  DOCUMENTATION CODES:   Not applicable  INTERVENTION:   Magic cup TID with meals, each supplement provides 290 kcal and 9 grams of protein  NUTRITION DIAGNOSIS:   Inadequate oral intake related to dysphagia as evidenced by other (comment)(diet just advanced to dysphagia 1 with pudding thick liquids).  Ongoing   GOAL:   Patient will meet greater than or equal to 90% of their needs  Progressing   MONITOR:   PO intake, Diet advancement, Supplement acceptance, Labs  ASSESSMENT:   Pt with a PMH significant for DM, HTN, emphysema presented with sore throat, difficulty swallowing, and mild stridor. Pt admitted with acute hypoxic respiratory failure with compromised airway in setting of acute epiglottis  Patient was extubated 1/28.  S/P swallow evaluation with SLP this morning. Diet advanced to dysphagia 1 with pudding thick liquids. Per discussion with SLP, patient does not want a feeding tube, he would like to try to eat. The only pudding thick supplement available at [facility] is magic cup. Will put magic cup on all meal trays to maximize protein & calorie intake.  Labs reviewed. K 3.3 (L) CBG's: [phone]  Medications reviewed and include KCl. IVF: LR at 100 ml/h  Weight trending down, likely with reduced swelling.   Diet Order:   Diet Order            DIET - DYS 1 Room service appropriate? No; Fluid consistency: Pudding Thick  Diet effective now              EDUCATION NEEDS:   Not appropriate for education at this time  Skin:  Skin Assessment: Reviewed RN Assessment  Last BM:  1/25  Height:   Ht Readings from Last 1 Encounters:  08/18/19 5\' 5"  (1.651 m)    Weight:   Wt Readings from Last 1 Encounters:  08/21/19 70 kg    Ideal Body Weight:  61.81 kg  BMI:  Body mass index is 25.68 kg/m.  Estimated Nutritional Needs:   Kcal:  1900-2100  Protein:  85-100 gm  Fluid:  >/= 1.9 L    08/23/19, RD, LDN, CNSC Pager  [phone] After Hours Pager [phone]

## 2019-08-21 NOTE — Progress Notes (Signed)
Physical Therapy Treatment Patient Details Name: Ronald Brady MRN: [id number] DOB: 06-Apr-1961 Today's Date: 08/21/2019    History of Present Illness 59 yo male presented to [facility] with sore throat, difficulty swallowing, and mild stridor.  CT neck showed epiglottitis with severe narrowing of airway.  Intubated in ER and transferred to [facility] for further management. ETT 1/26-28.  Encephalopathy, DM type 2.    PT Comments    Pt admitted with above diagnosis. Pt was able to ambulate on unit without device but did need 1Ue support for balance.  Antalgic gait due to previous right knee injury per pt.  Pt progressed distance and should progress well.   Pt currently with functional limitations due to the deficits listed below (see PT Problem List). Pt will benefit from skilled PT to increase their independence and safety with mobility to allow discharge to the venue listed below.     Follow Up Recommendations  Supervision for mobility/OOB;Home health PT     Equipment Recommendations  Other (comment)(TBA; may have a walker at home)    Recommendations for Other Services       Precautions / Restrictions Precautions Precautions: Fall Restrictions Weight Bearing Restrictions: No    Mobility  Bed Mobility Overal bed mobility: Needs Assistance Bed Mobility: Supine to Sit     Supine to sit: HOB elevated;Supervision     General bed mobility comments: increased time, redirection several times to task, assist for lines  Transfers Overall transfer level: Needs assistance Equipment used: 1 person hand held assist Transfers: Sit to/from Stand Sit to Stand: Min assist         General transfer comment: assist to steady and for lines  Ambulation/Gait Ambulation/Gait assistance: Min assist Gait Distance (Feet): 450 Feet Assistive device: 1 person hand held assist Gait Pattern/deviations: Step-to pattern;Step-through pattern;Decreased stride length;Antalgic   Gait velocity  interpretation: <1.31 ft/sec, indicative of household ambulator General Gait Details: antalgic on R LE as he states he has "bad knee", needs assist R HHA for balance/safety with pt using foot board initially and then placed hand on IV pole for balance. Needs at least 1 UE supprot and guard assist for balanc.e. Gait did improve as pt walked further.    Stairs             Wheelchair Mobility    Modified Rankin (Stroke Patients Only)       Balance Overall balance assessment: Needs assistance Sitting-balance support: No upper extremity supported;Feet supported Sitting balance-Leahy Scale: Good     Standing balance support: Single extremity supported Standing balance-Leahy Scale: Poor Standing balance comment: UE support for balance today                            Cognition Arousal/Alertness: Awake/alert Behavior During Therapy: WFL for tasks assessed/performed Overall Cognitive Status: No family/caregiver present to determine baseline cognitive functioning Area of Impairment: Orientation;Attention;Memory;Following commands;Safety/judgement                 Orientation Level: Place;Time Current Attention Level: Sustained Memory: Decreased short-term memory Following Commands: Follows one step commands consistently;Follows one step commands with increased time Safety/Judgement: Decreased awareness of deficits            Exercises General Exercises - Lower Extremity Ankle Circles/Pumps: AROM;Both;5 reps;Supine Long Arc Quad: AROM;Both;10 reps;Seated    General Comments General comments (skin integrity, edema, etc.): VSS on RA      Pertinent Vitals/Pain Pain Assessment: No/denies pain  Home Living                      Prior Function            PT Goals (current goals can now be found in the care plan section) Acute Rehab PT Goals Patient Stated Goal: to go home Progress towards PT goals: Progressing toward goals     Frequency    Min 3X/week      PT Plan Current plan remains appropriate    Co-evaluation              AM-PAC PT "6 Clicks" Mobility   Outcome Measure  Help needed turning from your back to your side while in a flat bed without using bedrails?: A Little Help needed moving from lying on your back to sitting on the side of a flat bed without using bedrails?: A Little Help needed moving to and from a bed to a chair (including a wheelchair)?: A Little Help needed standing up from a chair using your arms (e.g., wheelchair or bedside chair)?: A Little Help needed to walk in hospital room?: A Little Help needed climbing 3-5 steps with a railing? : A Little 6 Click Score: 18    End of Session Equipment Utilized During Treatment: Gait belt Activity Tolerance: Patient limited by fatigue Patient left: with call bell/phone within reach;in bed;with bed alarm set(going for MBS therefore placed pt back in bed per nurse) Nurse Communication: Mobility status PT Visit Diagnosis: Other abnormalities of gait and mobility (R26.89);Difficulty in walking, not elsewhere classified (R26.2)     Time: 4650-3546 PT Time Calculation (min) (ACUTE ONLY): 23 min  Charges:  $Gait Training: 8-22 mins $Therapeutic Exercise: 8-22 mins                     Nobuo Nunziata W,PT Acute Rehabilitation Services Pager:  [phone]  Office:  [phone]     Berline Lopes 08/21/2019, 9:53 AM

## 2019-08-21 NOTE — Progress Notes (Signed)
  Speech Language Pathology Treatment: Dysphagia  Patient Details Name: Ronald Brady MRN: [id number] DOB: 09/25/1960 Today's Date: 08/21/2019 Time: 0850-0902 SLP Time Calculation (min) (ACUTE ONLY): 12 min  Assessment / Plan / Recommendation Clinical Impression  Pt presents with no clinical improvement in swallowing since yesterday's assessment.  Continues with aphonia, weak cough, prolonged apneic period after consumption of water with explosive coughing that ensues.  PO trials ceased.  Pt clearly is aspirating.  Recommend proceeding with MBS today (doubt pt will tolerate FEES, although that would offer visualization of larynx) to determine if there is any safe consistency that he can eat.  He is impulsive, worrying about his pets and constantly expressing his desire to go home.  MBS scheduled for 10:30 this am; discussed with RN, Cori.   HPI HPI: 59 yo male presented to [facility] with sore throat, difficulty swallowing, and mild stridor.  CT neck showed epiglottitis with severe narrowing of airway.  Intubated in ER and transferred to [facility] for further management. ETT 1/26-28.  Encephalopathy, DM type 2.       SLP Plan  New goals to be determined pending instrumental study       Recommendations  Diet recommendations: NPO                Oral Care Recommendations: Oral care prior to ice chip/H20 Follow up Recommendations: Other (comment)(tba) SLP Visit Diagnosis: Dysphagia, unspecified (R13.10) Plan: New goals to be determined pending instrumental study       GO               Ronald Talerico L. Samson Frederic, MA CCC/SLP Acute Rehabilitation Services Office number [phone] Pager [phone]  Ronald Brady 08/21/2019, 9:05 AM

## 2019-08-21 NOTE — Progress Notes (Signed)
Modified Barium Swallow Progress Note  Patient Details  Name: Ronald Brady MRN: [id number] Date of Birth: [birth year]  Today's Date: 08/21/2019  Modified Barium Swallow completed.  Full report located under Chart Review in the Imaging Section.  Brief recommendations include the following:  Clinical Impression  Pt presents with pharyngeal dysphagia including delayed and incomplete initiation of protective actions leading to aspiration. Suspect aspiration of secretions given sudden/intense coughing episode at rest. Honey and nectar thickened liquids intruded laryngeal vestibule prior to full epiglottic inversion and fell below vocal cords and ascending onto cords during the cords. Aspirated honey thick barium with head in neutral position adn during chin tuck strategy without expected immediate cough- delayed. Residue in valleculae consistent and greatest at onset of study. Partial clearance with volitional cough. More conservative recommendation of puree diet, pudding thick liquids (nothing thinner than pudding); can have periodic ice chips with complete supervision with staff. Discussed case/results with resident with question of potential laryngeal carcinoma given symptoms and dysphagia, however symptoms began acutely versus over time per pt. Would further work up be warranted? Will follow while in acute care and recommend ST follow up at discharge.           Swallow Evaluation Recommendations   Recommended Consults: Consider ENT evaluation   SLP Diet Recommendations: Dysphagia 1 (Puree) solids;Pudding thick liquid;Other (Comment)(ice chips)       Medication Administration: Crushed with puree   Supervision: Full supervision/cueing for compensatory strategies;Patient able to self feed   Compensations: Slow rate;Minimize environmental distractions;Small sips/bites   Postural Changes: Seated upright at 90 degrees   Oral Care Recommendations: Oral care BID   Other Recommendations:  Order thickener from pharmacy    Royce Macadamia 08/21/2019,12:39 PM   Breck Coons Lonell Face.Ed Nurse, children's [phone] Office [phone]

## 2019-08-22 DIAGNOSIS — J0510 Acute epiglottitis without obstruction: Principal | ICD-10-CM

## 2019-08-22 LAB — GLUCOSE, CAPILLARY
Glucose-Capillary: 111 mg/dL — ABNORMAL HIGH (ref 70–99)
Glucose-Capillary: 129 mg/dL — ABNORMAL HIGH (ref 70–99)
Glucose-Capillary: 146 mg/dL — ABNORMAL HIGH (ref 70–99)
Glucose-Capillary: 218 mg/dL — ABNORMAL HIGH (ref 70–99)

## 2019-08-22 MED ORDER — CEFDINIR 300 MG PO CAPS: 300.0000 mg | ORAL_CAPSULE | Freq: Two times a day (BID) | ORAL | 0 refills | Status: AC

## 2019-08-22 MED ORDER — NICOTINE 14 MG/24HR TD PT24: 14.0000 mg | MEDICATED_PATCH | Freq: Every day | TRANSDERMAL | 0 refills | Status: AC

## 2019-08-22 NOTE — Progress Notes (Signed)
Discharge paperwork and instructions given to pt. Pt not in distress and tolerated well. 

## 2019-08-22 NOTE — Discharge Summary (Signed)
Physician Discharge Summary  Ronald Brady [facility] GNF:[id number] DOB: 1961-03-19 DOA: 08/18/2019  PCP: Tanna Furry, MD  Admit date: 08/18/2019 Discharge date: 08/22/2019  Admitted From: Home  Discharge disposition: Home   Recommendations for Outpatient Follow-Up:   . Follow up with your primary care provider in one week.  . Check CBC, BMP in 1 week . Dysphagia 1 (puree) solids and pudding thick liquids with medications until seen by PCP Drenda Freeze therapy . Follow-up with ENT as outpatient.  Discharge Diagnosis:   Active Problems:   Epiglottitis   Discharge Condition: Improved.  Diet recommendation: Low sodium, puree diet   Wound care: None.  Code status: Full.   History of Present Illness:   Patient is a 59 years old male with PMHx of emphysema, tobacco use disorder, hypertension and type 2 diabetes mellitus presented to [facility] ED with 1 day of progressively worsening sore throat, dysphagia and voice changes. No recent  fevers, chills, nausea, vomiting, diarrhea or recent sick contacts. Patient noted to have mild tachycardia and erythematous pharynx. Work up significant for severe epiglottitis on CT soft tissue of neck without any obvious abscess formation. Patient was intubated for airway protection. He was given one dose of steroids, rocephin and placed on propofol gtt. He was then transferred to [facility] ICU.   Hospital Course:   Following conditions were addressed during hospitalization as listed below,  Severe epiglottitis  Patient was intubated for airway protection at [facility] ED. He was given one dose of rocephin and steroids. Subsequently transferred to [facility] ICU. He received an additional dose of steroids and was continued on rocephin. Patient successfully extubated 1/28 to nasal cannula and transitioned to room air.   Will continue Omnicef on discharge to complete the course.  Patient is allergic to penicillin.  I also spoke with Dr. Annalee Genta ENT on  the phone and he stated that he could follow the patient as outpatient.  Post-extubation dysphagia:  He was seen by speech therapy.  Was advised pure diet with pudding thick liquids.  Patient was extensively counseled about it.  He was advised to remain on this diet until seen by PCP or speech therapy.  Speech therapy will be consulted as outpatient.  Poorly controlled DMII HbA1c 11.8.  Patient received her insulin regimen while in the hospital.  Patient will have follow-up with his primary care physician as outpatient.  Centrilobular emphysema w/Hx of COPD Continue spiriva, symbicort and proventil at home.   Acute metabolic encephalopathy Resolved.  Disposition.  At this time, patient is stable for disposition home.  He was advised to remain on diet as advised by speech therapy.  He was advised to complete the course of antibiotic.  Medical Consultants:    PCCM  ENT on the phone  Procedures:    ETT 1/26 >> 1/28  Subjective:   Today, patient strongly wishes to go home and threatens to leave AGAINST MEDICAL ADVICE if not discharged.  Denies any throat pain, nausea, vomiting or increasing shortness of breath.  Ambulating in the hallway.  Discharge Exam:   Vitals:   08/22/19 0800 08/22/19 1100  BP: 125/71   Pulse: 90   Resp: 18   Temp: 98.4 F (36.9 C) (!) 97.4 F (36.3 C)  SpO2: 96%    Vitals:   08/22/19 0357 08/22/19 0500 08/22/19 0800 08/22/19 1100  BP: 125/74  125/71   Pulse: 73  90   Resp: 17  18   Temp: 98.4 F (36.9 C)  98.4 F (36.9 C) (!) 97.4 F (36.3 C)  TempSrc:   Oral Oral  SpO2: 100%  96%   Weight:  74.5 kg    Height:       General: Alert awake, not in obvious distress, hoarse voice with distinct speech. HENT: pupils equally reacting to light,  No scleral pallor or icterus noted. Oral mucosa is moist.  Chest:  Clear breath sounds.  Diminished breath sounds bilaterally.  No wheezes noted.  CVS: S1 &S2 heard. No murmur.  Regular rate and  rhythm. Abdomen: Soft, nontender, nondistended.  Bowel sounds are heard.   Extremities: No cyanosis, clubbing or edema.  Peripheral pulses are palpable. Psych: Alert, awake and oriented, normal mood CNS:  No cranial nerve deficits.  Power equal in all extremities.   Skin: Warm and dry.  No rashes noted.  The results of significant diagnostics from this hospitalization (including imaging, microbiology, ancillary and laboratory) are listed below for reference.     Diagnostic Studies:   DG Abd 1 View  Result Date: 08/19/2019 CLINICAL DATA:  Enteric tube placement. EXAM: ABDOMEN - 1 VIEW COMPARISON:  None. FINDINGS: Enteric tube in the second portion of the duodenum. The bowel gas pattern is normal. No radio-opaque calculi or other significant radiographic abnormality are seen. No acute osseous abnormality. IMPRESSION: Enteric tube in the duodenum. Electronically Signed   By: Obie Dredge M.D.   On: 08/19/2019 13:04   CT Soft Tissue Neck W Contrast  Result Date: 08/18/2019 CLINICAL DATA:  Neck pain with acute infection suspected EXAM: CT NECK WITH CONTRAST TECHNIQUE: Multidetector CT imaging of the neck was performed using the standard protocol following the bolus administration of intravenous contrast. CONTRAST:  67mL OMNIPAQUE IOHEXOL 300 MG/ML  SOLN COMPARISON:  None. FINDINGS: Pharynx and larynx: Marked submucosal low-density thickening of the epiglottis and involving the left more than right aryepiglottic fold. There is related airway narrowing to 2 mm anterior to posterior as measured on sagittal reformats. Inflammation continues towards the root of the tongue but does not clearly extend anterior to the hyoid. No mass effect at the floor of mouth. Mild thickening of the tonsils with some low-density within left-sided crypts but no hyperenhancement or peritonsillar inflammation. Salivary glands: No inflammation, mass, or stone. Thyroid: Normal. Lymph nodes: None enlarged or abnormal density.  Vascular: Mild atherosclerotic calcification Limited intracranial: Negative Visualized orbits: Limited coverage is negative Mastoids and visualized paranasal sinuses: Debris in the left nostril, nonspecific in this setting. Skeleton: Mild cervical spine degeneration. Atlantooccipital non segmentation. Upper chest: Emphysema with saber trachea and airway thickening. No visible pneumonia or aspiration. Other: Critical Value/emergent results were called by telephone at the time of interpretation on 08/18/2019 at 8:56 am to providerBRIAN MILLER , who verbally acknowledged these results. IMPRESSION: 1. Epiglottitis with marked swelling and severe airway narrowing. 2.  Emphysema (ICD10-J43.9). Electronically Signed   By: Marnee Spring M.D.   On: 08/18/2019 09:00   DG Chest Port 1 View  Result Date: 08/18/2019 CLINICAL DATA:  Post intubation EXAM: PORTABLE CHEST 1 VIEW COMPARISON:  None. FINDINGS: Endotracheal tube is approximately 4 cm above the carina. Enteric tube passes into the stomach. No consolidation or edema. No pleural effusion or pneumothorax. Normal heart size. Chronic right rib fractures. IMPRESSION: Endotracheal tube in satisfactory position. No acute abnormality in the chest. Electronically Signed   By: Guadlupe Spanish M.D.   On: 08/18/2019 10:11     Labs:   Basic Metabolic Panel: Recent Labs  Lab 08/18/19 0728 08/18/19  7412 08/19/19 0314 08/19/19 0314 08/20/19 0239 08/21/19 1002  NA 133*  --  139  --  139 137  K 4.5   < > 3.8   < > 3.9 3.3*  CL 99  --  106  --  108 102  CO2 23  --  24  --  25 24  GLUCOSE 461*  --  143*  --  149* 124*  BUN 15  --  19  --  25* 19  CREATININE 0.96  --  0.75  --  0.75 0.80  CALCIUM 9.5  --  9.0  --  8.6* 8.1*  MG  --   --  2.2  --   --   --   PHOS  --   --  3.5  --   --   --    < > = values in this interval not displayed.   GFR Estimated Creatinine Clearance: 96.1 mL/min (by C-G formula based on SCr of 0.8 mg/dL). Liver Function Tests: No  results for input(s): AST, ALT, ALKPHOS, BILITOT, PROT, ALBUMIN in the last 168 hours. No results for input(s): LIPASE, AMYLASE in the last 168 hours. No results for input(s): AMMONIA in the last 168 hours. Coagulation profile No results for input(s): INR, PROTIME in the last 168 hours.  CBC: Recent Labs  Lab 08/18/19 0728 08/19/19 0314 08/20/19 0239 08/21/19 1002  WBC 21.5* 14.8* 12.4* 6.7  NEUTROABS 19.0*  --   --   --   HGB 17.4* 14.2 12.8* 13.2  HCT 51.1 42.2 38.3* 39.4  MCV 87.8 88.7 89.5 87.9  PLT 191 147* 152 137*   Cardiac Enzymes: No results for input(s): CKTOTAL, CKMB, CKMBINDEX, TROPONINI in the last 168 hours. BNP: Invalid input(s): POCBNP CBG: Recent Labs  Lab 08/21/19 1921 08/21/19 2347 08/22/19 0355 08/22/19 0931 08/22/19 1133  GLUCAP 220* 129* 111* 218* 146*   D-Dimer No results for input(s): DDIMER in the last 72 hours. Hgb A1c No results for input(s): HGBA1C in the last 72 hours. Lipid Profile Recent Labs    08/20/19 0239  TRIG 51   Thyroid function studies No results for input(s): TSH, T4TOTAL, T3FREE, THYROIDAB in the last 72 hours.  Invalid input(s): FREET3 Anemia work up No results for input(s): VITAMINB12, FOLATE, FERRITIN, TIBC, IRON, RETICCTPCT in the last 72 hours. Microbiology Recent Results (from the past 240 hour(s))  Group A Strep by PCR     Status: None   Collection Time: 08/18/19  7:27 AM   Specimen: Throat; Sterile Swab  Result Value Ref Range Status   Group A Strep by PCR NOT DETECTED NOT DETECTED Final    Comment: Performed at [facility], [address], [place], [place] [address]  Respiratory Panel by RT PCR (Flu A&B, Covid) - Nasopharyngeal Swab     Status: None   Collection Time: 08/18/19  9:23 AM   Specimen: Nasopharyngeal Swab  Result Value Ref Range Status   SARS Coronavirus 2 by RT PCR NEGATIVE NEGATIVE Final    Comment: (NOTE) SARS-CoV-2 target nucleic acids are NOT DETECTED. The SARS-CoV-2 RNA is generally  detectable in upper respiratoy specimens during the acute phase of infection. The lowest concentration of SARS-CoV-2 viral copies this assay can detect is 131 copies/mL. A negative result does not preclude SARS-Cov-2 infection and should not be used as the sole basis for treatment or other patient management decisions. A negative result may occur with  improper specimen collection/handling, submission of specimen other than nasopharyngeal swab, presence  of viral mutation(s) within the areas targeted by this assay, and inadequate number of viral copies (<131 copies/mL). A negative result must be combined with clinical observations, patient history, and epidemiological information. The expected result is Negative. Fact Sheet for Patients:  https://www.moore.com/https://www.fda.gov/media/142436/download Fact Sheet for Healthcare Providers:  https://www.young.biz/https://www.fda.gov/media/142435/download This test is not yet ap proved or cleared by the [place] FDA and  has been authorized for detection and/or diagnosis of SARS-CoV-2 by FDA under an Emergency Use Authorization (EUA). This EUA will remain  in effect (meaning this test can be used) for the duration of the COVID-19 declaration under Section 564(b)(1) of the Act, 21 U.S.C. section 360bbb-3(b)(1), unless the authorization is terminated or revoked sooner.    Influenza A by PCR NEGATIVE NEGATIVE Final   Influenza B by PCR NEGATIVE NEGATIVE Final    Comment: (NOTE) The Xpert Xpress SARS-CoV-2/FLU/RSV assay is intended as an aid in  the diagnosis of influenza from Nasopharyngeal swab specimens and  should not be used as a sole basis for treatment. Nasal washings and  aspirates are unacceptable for Xpert Xpress SARS-CoV-2/FLU/RSV  testing. Fact Sheet for Patients: https://www.moore.com/https://www.fda.gov/media/142436/download Fact Sheet for Healthcare Providers: https://www.young.biz/https://www.fda.gov/media/142435/download This test is not yet approved or cleared by the [place] FDA and  has been  authorized for detection and/or diagnosis of SARS-CoV-2 by  FDA under an Emergency Use Authorization (EUA). This EUA will remain  in effect (meaning this test can be used) for the duration of the  Covid-19 declaration under Section 564(b)(1) of the Act, 21  U.S.C. section 360bbb-3(b)(1), unless the authorization is  terminated or revoked. Performed at [facility], [address] [place], [place] [address]   MRSA PCR Screening     Status: None   Collection Time: 08/18/19  1:56 PM   Specimen: Nasal Mucosa; Nasopharyngeal  Result Value Ref Range Status   MRSA by PCR NEGATIVE NEGATIVE Final    Comment:        The GeneXpert MRSA Assay (FDA approved for NASAL specimens only), is one component of a comprehensive MRSA colonization surveillance program. It is not intended to diagnose MRSA infection nor to guide or monitor treatment for MRSA infections. Performed at [facility], [address] [place], [place] [address]   Respiratory Panel by PCR     Status: None   Collection Time: 08/18/19  2:55 PM   Specimen: Nasopharyngeal Swab; Respiratory  Result Value Ref Range Status   Adenovirus NOT DETECTED NOT DETECTED Final   Coronavirus 229E NOT DETECTED NOT DETECTED Final    Comment: (NOTE) The Coronavirus on the Respiratory Panel, DOES NOT test for the novel  Coronavirus (2019 nCoV)    Coronavirus HKU1 NOT DETECTED NOT DETECTED Final   Coronavirus NL63 NOT DETECTED NOT DETECTED Final   Coronavirus OC43 NOT DETECTED NOT DETECTED Final   Metapneumovirus NOT DETECTED NOT DETECTED Final   Rhinovirus / Enterovirus NOT DETECTED NOT DETECTED Final   Influenza A NOT DETECTED NOT DETECTED Final   Influenza B NOT DETECTED NOT DETECTED Final   Parainfluenza Virus 1 NOT DETECTED NOT DETECTED Final   Parainfluenza Virus 2 NOT DETECTED NOT DETECTED Final   Parainfluenza Virus 3 NOT DETECTED NOT DETECTED Final   Parainfluenza Virus 4 NOT DETECTED NOT DETECTED Final   Respiratory Syncytial  Virus NOT DETECTED NOT DETECTED Final   Bordetella pertussis NOT DETECTED NOT DETECTED Final   Chlamydophila pneumoniae NOT DETECTED NOT DETECTED Final   Mycoplasma pneumoniae NOT DETECTED NOT DETECTED Final  Comment: Performed at [facility], [address] [place], [place] [address]  Culture, blood (routine x 2)     Status: None (Preliminary result)   Collection Time: 08/18/19  3:01 PM   Specimen: BLOOD LEFT ARM  Result Value Ref Range Status   Specimen Description BLOOD LEFT ARM  Final   Special Requests   Final    BOTTLES DRAWN AEROBIC ONLY Blood Culture results may not be optimal due to an excessive volume of blood received in culture bottles   Culture   Final    NO GROWTH 3 DAYS Performed at [facility], [address], [place], [place] [address]    Report Status PENDING  Incomplete  Culture, blood (routine x 2)     Status: None (Preliminary result)   Collection Time: 08/18/19  3:10 PM   Specimen: BLOOD LEFT HAND  Result Value Ref Range Status   Specimen Description BLOOD LEFT HAND  Final   Special Requests   Final    BOTTLES DRAWN AEROBIC ONLY Blood Culture adequate volume   Culture   Final    NO GROWTH 3 DAYS Performed at [facility], [address] [place], [place] [address]    Report Status PENDING  Incomplete     Discharge Instructions:   Discharge Instructions    Ambulatory referral to Speech Therapy   Complete by: As directed    Dysphagia following epiglottis   Diet - low sodium heart healthy   Complete by: As directed    Dysphagia 1 (puree) solids and pudding thick liquids   Discharge instructions   Complete by: As directed    Follow-up with primary care physician in 1 week.  Complete the course of antibiotic.  If you experience difficulty breathing, worsening voice or increasing throat pain -please seek medical attention immediately.  Please stick to pure diet and instructions as provided by speech therapy until you see your primary  care physician.  Please follow-up with Dr. Annalee Genta ENT as outpatient in 1 to 2 weeks if not improved.   Increase activity slowly   Complete by: As directed      Allergies as of 08/22/2019      Reactions   Penicillins Other (See Comments)   Pt is unsure of reaction. States PCP told him he was allergic. Did it involve swelling of the face/tongue/throat, SOB, or low BP? Unknown Did it involve sudden or severe rash/hives, skin peeling, or any reaction on the inside of your mouth or nose? Unknown Did you need to seek medical attention at a hospital or doctor's office? Unknown When did it last happen?unk If all above answers are "NO", may proceed with cephalosporin use.      Medication List    TAKE these medications   albuterol 108 (90 Base) MCG/ACT inhaler Commonly known as: VENTOLIN HFA Inhale 2 puffs into the lungs every 4 (four) hours as needed for wheezing or shortness of breath.   cefdinir 300 MG capsule Commonly known as: OMNICEF Take 1 capsule (300 mg total) by mouth 2 (two) times daily for 3 days.   DULoxetine 60 MG capsule Commonly known as: CYMBALTA Take 60 mg by mouth daily.   HumaLOG KwikPen 100 UNIT/ML KwikPen Generic drug: insulin lispro Inject 16 Units into the skin 3 (three) times daily with meals.   Levemir 100 UNIT/ML injection Generic drug: insulin detemir Inject 23 Units into the skin 2 (two) times daily.   nicotine 14 mg/24hr patch Commonly known  as: NICODERM CQ - dosed in mg/24 hours Place 1 patch (14 mg total) onto the skin daily. Start taking on: August 23, 2019   pravastatin 20 MG tablet Commonly known as: PRAVACHOL Take 20 mg by mouth at bedtime.   sitaGLIPtin 50 MG tablet Commonly known as: JANUVIA Take 50 mg by mouth daily.   Spiriva HandiHaler 18 MCG inhalation capsule Generic drug: tiotropium Place 18 mcg into inhaler and inhale daily.   Symbicort 160-4.5 MCG/ACT inhaler Generic drug: budesonide-formoterol Inhale 2 puffs into  the lungs 2 (two) times daily.   tadalafil 10 MG tablet Commonly known as: CIALIS Take 10 mg by mouth daily as needed for erectile dysfunction.   traZODone 100 MG tablet Commonly known as: DESYREL Take 100 mg by mouth at bedtime.        Time coordinating discharge: 39 minutes  Signed:  Cortavious Nix  [facility] 08/22/2019, 1:57 PM

## 2019-08-22 NOTE — Progress Notes (Addendum)
  Speech Language Pathology Treatment: Dysphagia  Patient Details Name: Ronald Brady MRN: [id number] DOB: 1961/01/10 Today's Date: 08/22/2019 Time: 2706-2376 SLP Time Calculation (min) (ACUTE ONLY): 15 min  Assessment / Plan / Recommendation Clinical Impression  Pt was seen for skilled ST targeting diet tolerance and diagnostic treatment.  Pt was encountered awake/alert and he stated that he was hoping to discharge today.  Upon SLP arrival, observed cups of water in the patient's room and pt stated that he had been consuming water throughout the morning. Discarded water and made RN aware.  Re-educated pt with teach back regarding results and recommendations from MBS on 08/21/19 including diet recommendations for Dysphagia 1 (puree) solids and pudding-thick liquids only. Pt verbalized understanding; however, he would benefit from continued reinforcement.  Pt consumed trials of puree independently without overt s/sx of aspiration or difficulty.  He completed oral care given set up following puree trials and then consumed ice chips.  He exhibited an immediate cough following 3/5 ice chip trials.  Pt completed hard/fast swallows with ice chips x10 given minimal verbal cues and an immediate cough was observed x3.  Due to the severity of the pt's pharyngeal dysphagia as seen on yesterday's MBS, continue to recommend Dysphagia 1 (puree) solids and pudding thick liquids with medications crushed in puree.  Pt may have a few small ice chips with full supervision following thorough oral care.  Although pt was educated in depth regarding all recommendations, suspect that he will have some difficulty with carry-over when he returns home which places him at an increased risk for aspiration.  Strongly recommend home health Speech Therapy targeting dysphagia at time of discharge.  Pt may also benefit from an ENT consult during this admission or as an outpatient.  SLP will continue to f/u acutely.     HPI HPI: 59 yo  male presented to [facility] with sore throat, difficulty swallowing, and mild stridor.  CT neck showed epiglottitis with severe narrowing of airway.  Intubated in ER and transferred to [facility] for further management. ETT 1/26-28.  Encephalopathy, DM type 2. Pt states symptoms began over several days.        SLP Plan  Continue with current plan of care       Recommendations  Diet recommendations: Pudding-thick liquid;Dysphagia 1 (puree) Medication Administration: Crushed with puree Supervision: Patient able to self feed Compensations: Slow rate;Minimize environmental distractions;Small sips/bites                Oral Care Recommendations: Oral care prior to ice chip/H20 Follow up Recommendations: Home health SLP SLP Visit Diagnosis: Dysphagia, pharyngeal phase (R13.13) Plan: Continue with current plan of care       GO               Ronald Brady M.S., CCC-SLP Acute Rehabilitation Services Office: [phone]  Ronald Brady [facility] 08/22/2019, 11:38 AM

## 2019-08-23 LAB — CULTURE, BLOOD (ROUTINE X 2)
Culture: NO GROWTH
Culture: NO GROWTH
Special Requests: ADEQUATE

## 2019-12-08 ENCOUNTER — Encounter (INDEPENDENT_AMBULATORY_CARE_PROVIDER_SITE_OTHER): Payer: Self-pay | Admitting: *Deleted

## 2020-09-05 ENCOUNTER — Encounter (HOSPITAL_COMMUNITY): Payer: Self-pay | Admitting: *Deleted

## 2020-09-05 ENCOUNTER — Other Ambulatory Visit: Payer: Self-pay

## 2020-09-05 ENCOUNTER — Emergency Department (HOSPITAL_COMMUNITY)
Admission: EM | Admit: 2020-09-05 | Discharge: 2020-09-05 | Disposition: A | Discharge status: Home / Self Care | Payer: Medicaid Other | Attending: Emergency Medicine | Admitting: Emergency Medicine

## 2020-09-05 ENCOUNTER — Emergency Department (HOSPITAL_COMMUNITY): Payer: Medicaid Other

## 2020-09-05 DIAGNOSIS — Y92008 Other place in unspecified non-institutional (private) residence as the place of occurrence of the external cause: Secondary | ICD-10-CM | POA: Insufficient documentation

## 2020-09-05 DIAGNOSIS — W01198A Fall on same level from slipping, tripping and stumbling with subsequent striking against other object, initial encounter: Secondary | ICD-10-CM | POA: Insufficient documentation

## 2020-09-05 DIAGNOSIS — F17200 Nicotine dependence, unspecified, uncomplicated: Secondary | ICD-10-CM | POA: Insufficient documentation

## 2020-09-05 DIAGNOSIS — E119 Type 2 diabetes mellitus without complications: Secondary | ICD-10-CM | POA: Insufficient documentation

## 2020-09-05 DIAGNOSIS — Y9389 Activity, other specified: Secondary | ICD-10-CM | POA: Insufficient documentation

## 2020-09-05 DIAGNOSIS — I10 Essential (primary) hypertension: Secondary | ICD-10-CM | POA: Insufficient documentation

## 2020-09-05 DIAGNOSIS — Y999 Unspecified external cause status: Secondary | ICD-10-CM | POA: Insufficient documentation

## 2020-09-05 DIAGNOSIS — M25532 Pain in left wrist: Secondary | ICD-10-CM | POA: Insufficient documentation

## 2020-09-05 DIAGNOSIS — Z794 Long term (current) use of insulin: Secondary | ICD-10-CM | POA: Diagnosis not present

## 2020-09-05 DIAGNOSIS — M79642 Pain in left hand: Secondary | ICD-10-CM | POA: Diagnosis not present

## 2020-09-05 DIAGNOSIS — Z79899 Other long term (current) drug therapy: Secondary | ICD-10-CM | POA: Insufficient documentation

## 2020-09-05 DIAGNOSIS — S5012XA Contusion of left forearm, initial encounter: Secondary | ICD-10-CM | POA: Diagnosis not present

## 2020-09-05 DIAGNOSIS — S59912A Unspecified injury of left forearm, initial encounter: Secondary | ICD-10-CM | POA: Diagnosis present

## 2020-09-05 DIAGNOSIS — W19XXXA Unspecified fall, initial encounter: Secondary | ICD-10-CM

## 2020-09-05 MED ORDER — IBUPROFEN 400 MG PO TABS
600.0000 mg | ORAL_TABLET | Freq: Once | ORAL | Status: AC
  Administered 2020-09-05: 600 mg via ORAL
  Filled 2020-09-05: qty 2

## 2020-09-05 MED ORDER — IBUPROFEN 600 MG PO TABS: 600.0000 mg | ORAL_TABLET | Freq: Three times a day (TID) | ORAL | 0 refills | Status: AC | PRN

## 2020-09-05 NOTE — ED Notes (Signed)
ED Provider at bedside. 

## 2020-09-05 NOTE — ED Triage Notes (Signed)
Fell at home 2 days ago, pain in left hand, wrist and forearm

## 2020-09-05 NOTE — ED Provider Notes (Signed)
[facility] EMERGENCY DEPARTMENT Provider Note   CSN: [id number] Arrival date & time: 09/05/20  1145     History Chief Complaint  Patient presents with  . Fall    FITZHUGH VIZCARRONDO is a 60 y.o. male.  He said he fell climbing up his porch 2 days ago striking his left forearm on the step.  Woke up yesterday with severe pain in his forearm.  Worse with movement and palpation.  No numbness.  Has tried nothing for it.  The history is provided by the patient.  Arm Injury Location:  Arm Arm location:  L forearm Injury: yes   Time since incident:  2 days Mechanism of injury: fall   Fall:    Fall occurred: up stairs.   Point of impact: left forearm. Pain details:    Quality:  Throbbing   Radiates to:  Does not radiate   Severity:  Severe   Onset quality:  Gradual   Timing:  Constant   Progression:  Unchanged Dislocation: no   Foreign body present:  No foreign bodies Relieved by:  None tried Worsened by:  Movement Ineffective treatments:  None tried Associated symptoms: swelling   Associated symptoms: no back pain, no fever and no numbness        Past Medical History:  Diagnosis Date  . Diabetes mellitus without complication (HCC)   . Emphysema lung (HCC)   . Hypertension     Patient Active Problem List   Diagnosis Date Noted  . 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 ABDOMINAL SURGERY         No family history on file.  Social History   Tobacco Use  . Smoking status: Current Every Day Smoker    Packs/day: 1.50  . Smokeless tobacco: Never Used  Substance Use Topics  . Alcohol use: Never  . Drug use: Never    Home Medications Prior to Admission medications   Medication Sig Start Date End Date Taking? Authorizing Provider  albuterol (VENTOLIN HFA) 108 (90 Base) MCG/ACT inhaler Inhale 2 puffs into the lungs every 4 (four) hours as needed for wheezing or shortness of breath. 05/26/19   [provider]   DULoxetine (CYMBALTA) 60 MG capsule Take 60 mg by mouth daily. 05/26/19   [provider]  HUMALOG KWIKPEN 100 UNIT/ML KwikPen Inject 16 Units into the skin 3 (three) times daily with meals.  05/27/19   [provider]  LEVEMIR 100 UNIT/ML injection Inject 23 Units into the skin 2 (two) times daily. 05/26/19   [provider]  nicotine (NICODERM CQ - DOSED IN MG/24 HOURS) 14 mg/24hr patch Place 1 patch (14 mg total) onto the skin daily. 08/23/19   Pokhrel, Rebekah Chesterfield, MD  pravastatin (PRAVACHOL) 20 MG tablet Take 20 mg by mouth at bedtime. 05/26/19   [provider]  sitaGLIPtin (JANUVIA) 50 MG tablet Take 50 mg by mouth daily.    [provider]  SPIRIVA HANDIHALER 18 MCG inhalation capsule Place 18 mcg into inhaler and inhale daily. 05/26/19   [provider]  SYMBICORT 160-4.5 MCG/ACT inhaler Inhale 2 puffs into the lungs 2 (two) times daily. 05/26/19   [provider]  tadalafil (CIALIS) 10 MG tablet Take 10 mg by mouth daily as needed for erectile dysfunction.    [provider]  traZODone (DESYREL) 100 MG tablet Take 100 mg by mouth at bedtime. 05/26/19   [provider]    Allergies  Penicillins  Review of Systems   Review of Systems  Constitutional: Negative for fever.  Musculoskeletal: Negative for back pain.  Skin: Positive for wound.    Physical Exam Updated Vital Signs BP 107/87   Pulse (!) 101   Temp 97.7 F (36.5 C) (Oral)   Resp (!) 24   SpO2 98%   Physical Exam Vitals and nursing note reviewed.  Constitutional:      Appearance: He is well-developed and well-nourished.  HENT:     Head: Normocephalic and atraumatic.  Eyes:     Conjunctiva/sclera: Conjunctivae normal.  Pulmonary:     Effort: Pulmonary effort is normal.  Musculoskeletal:        General: Swelling, tenderness and signs of injury present. Normal range of motion.       Arms:     Cervical back: Neck supple.  Skin:     General: Skin is warm and dry.  Neurological:     Mental Status: He is alert.     GCS: GCS eye subscore is 4. GCS verbal subscore is 5. GCS motor subscore is 6.  Psychiatric:        Mood and Affect: Mood and affect normal.     ED Results / Procedures / Treatments   Labs (all labs ordered are listed, but only abnormal results are displayed) Labs Reviewed - No data to display  EKG None  Radiology DG Forearm Left  Result Date: 09/05/2020 CLINICAL DATA:  Pain following fall EXAM: LEFT FOREARM - 2 VIEW COMPARISON:  None. FINDINGS: Frontal and lateral views were obtained. No fracture or dislocation. Joint spaces appear normal. No erosive change. IMPRESSION: No fracture or dislocation.  No evident arthropathy. Electronically Signed   By: Bretta Bang III M.D.   On: 09/05/2020 12:46   DG Wrist Complete Left  Result Date: 09/05/2020 CLINICAL DATA:  Pain following fall EXAM: LEFT WRIST - COMPLETE 3+ VIEW COMPARISON:  None. FINDINGS: Frontal, oblique, and lateral views were obtained. There is no fracture or dislocation. The joint spaces appear normal. No erosive change. IMPRESSION: No fracture or dislocation.  No evident arthropathy. Electronically Signed   By: Bretta Bang III M.D.   On: 09/05/2020 12:45   DG Hand Complete Left  Result Date: 09/05/2020 CLINICAL DATA:  Pain following fall EXAM: LEFT HAND - COMPLETE 3+ VIEW COMPARISON:  None. FINDINGS: Frontal, oblique, and lateral views were obtained. There is flexion of the third, fourth, and fifth PIP and DIP joints on all submitted images. There is no appreciable fracture or dislocation. No appreciable joint space narrowing or erosion. IMPRESSION: No fracture or dislocation. No appreciable joint space narrowing or erosion. Electronically Signed   By: Bretta Bang III M.D.   On: 09/05/2020 12:44    Procedures Procedures   Medications Ordered in ED Medications  ibuprofen (ADVIL) tablet 600 mg (has no administration in time  range)    ED Course  I have reviewed the triage vital signs and the nursing notes.  Pertinent labs & imaging results that were available during my care of the patient were reviewed by me and considered in my medical decision making (see chart for details).    MDM Rules/Calculators/A&P                         60 year old male here for left forearm pain after a fall.  Differential includes fracture, dislocation, contusion, compartment syndrome.  Area is quite tender and tense although proximal compartments are soft.  Think this is just very tender hematoma.  X-rays ordered and interpreted by me as no acute fractures.  Will place in sling, NSAIDs, ice.  Final Clinical Impression(s) / ED Diagnoses Final diagnoses:  Fall, initial encounter  Contusion of left forearm, initial encounter    Rx / DC Orders ED Discharge Orders         Ordered    ibuprofen (ADVIL) 600 MG tablet  Every 8 hours PRN        09/05/20 1344           Terrilee Files, MD 09/05/20 1750

## 2020-09-05 NOTE — Discharge Instructions (Addendum)
You are seen in the emergency department for an injury to your left forearm.  You had x-rays that did not show any sign of fracture.  This area is likely a contusion which is bleeding and bruising underneath the skin.  You should rest your arm.  Ice to the affected area 20 minutes on 20 minutes off while you are awake.  Ibuprofen for pain.  Follow-up with your doctor.  Return to the emergency department for any worsening or concerning symptoms.

## 2020-09-05 NOTE — ED Notes (Signed)
Entered room and introduced self to patient. Pt appears to be resting in bed, respirations are even and unlabored with equal chest rise and fall. Bed is locked in the lowest position, side rails x1, call bell within reach. Pt educated on call light use and hourly rounding, verbalized understanding and in agreement at this time. All questions and concerns voiced addressed. Refreshments offered and provided per patient request.  

## 2020-09-28 ENCOUNTER — Ambulatory Visit (INDEPENDENT_AMBULATORY_CARE_PROVIDER_SITE_OTHER): Payer: Medicaid Other | Admitting: Family

## 2020-09-28 ENCOUNTER — Encounter: Payer: Self-pay | Admitting: Family

## 2020-09-28 ENCOUNTER — Other Ambulatory Visit: Payer: Self-pay

## 2020-09-28 DIAGNOSIS — M79651 Pain in right thigh: Secondary | ICD-10-CM

## 2020-09-28 MED ORDER — MELOXICAM 15 MG PO TABS: 15.0000 mg | ORAL_TABLET | Freq: Every day | ORAL | 0 refills | Status: AC

## 2020-09-28 MED ORDER — HYDROCODONE-ACETAMINOPHEN 5-325 MG PO TABS: 1.0000 | ORAL_TABLET | Freq: Four times a day (QID) | ORAL | 0 refills | Status: DC | PRN

## 2020-09-28 MED ORDER — HYDROCODONE-ACETAMINOPHEN 5-325 MG PO TABS: 1.0000 | ORAL_TABLET | Freq: Four times a day (QID) | ORAL | 0 refills | Status: AC | PRN

## 2020-09-28 NOTE — Progress Notes (Signed)
Office Visit Note   Patient: Ronald Brady           Date of Birth: [birth year]           MRN: [id number] Visit Date: 09/28/2020              Requested by: Tanna Furry, MD [address] [place],  [place] [address] PCP: Tanna Furry, MD  No chief complaint on file.     HPI: The patient is a 60 year old gentleman seen today complaining of right leg pain this is been ongoing for about 2 weeks following an acute episode where he was walking in his kitchen and felt and heard a pop in his right posterior thigh he has been seen and evaluated by his primary care for the same thing.  He has been treating this with IcyHot with minimal relief.  Difficulty with ambulation due to pain.  Denies any knowledge of wounds bruising warmth no fever no chills Assessment & Plan: Visit Diagnoses:  1. Acute thigh pain, right     Plan: Presumed hamstring tear on the right.  Patient would like to treat this conservatively.  Discussed possibility of MRI patient declined.  Will use ice elevation rest.  Provided prescription for Mobic as well as oxycodone  Follow-Up Instructions: Return in about 2 weeks (around 10/12/2020), or if symptoms worsen or fail to improve.   Ortho Exam  Patient is alert, oriented, no adenopathy, well-dressed, normal affect, normal respiratory effort. Pitting edema to right thigh. Palpable knot centrally. exquisite pain with palpation. Pain with knee extension. No ecchymosis no wound. No erythema or warmth.  Imaging: No results found. No images are attached to the encounter.  Labs: Lab Results  Component Value Date   HGBA1C 11.8 (H) 08/18/2019   REPTSTATUS 08/23/2019 FINAL 08/18/2019   CULT  08/18/2019    NO GROWTH 5 DAYS Performed at [facility], [address], [place], [place] [address]      No results found for: ALBUMIN, PREALBUMIN, LABURIC  Lab Results  Component Value Date   MG 2.2 08/19/2019   No results found for:  VD25OH  No results found for: PREALBUMIN CBC EXTENDED Latest Ref Rng & Units 08/21/2019 08/20/2019 08/19/2019  WBC 4.0 - 10.5 K/uL 6.7 12.4(H) 14.8(H)  RBC 4.22 - 5.81 MIL/uL 4.48 4.28 4.76  HGB 13.0 - 17.0 g/dL 93.9 12.8(L) 14.2  HCT 39.0 - 52.0 % 39.4 38.3(L) 42.2  PLT 150 - 400 K/uL 137(L) 152 147(L)  NEUTROABS 1.7 - 7.7 K/uL - - -  LYMPHSABS 0.7 - 4.0 K/uL - - -     There is no height or weight on file to calculate BMI.  Orders:  No orders of the defined types were placed in this encounter.  Meds ordered this encounter  Medications  . DISCONTD: HYDROcodone-acetaminophen (NORCO) 5-325 MG tablet    Sig: Take 1 tablet by mouth every 6 (six) hours as needed.    Dispense:  30 tablet    Refill:  0  . meloxicam (MOBIC) 15 MG tablet    Sig: Take 1 tablet (15 mg total) by mouth daily.    Dispense:  30 tablet    Refill:  0  . HYDROcodone-acetaminophen (NORCO) 5-325 MG tablet    Sig: Take 1 tablet by mouth every 6 (six) hours as needed.    Dispense:  30 tablet    Refill:  0     Procedures: No procedures performed  Clinical Data:  No additional findings.  ROS:  All other systems negative, except as noted in the HPI. Review of Systems  Constitutional: Negative for chills and fever.  Musculoskeletal: Positive for gait problem and myalgias.  Neurological: Negative for weakness.    Objective: Vital Signs: There were no vitals taken for this visit.  Specialty Comments:  No specialty comments available.  PMFS History: Patient Active Problem List   Diagnosis Date Noted  . 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 Diabetes mellitus without complication (HCC)   . Emphysema lung (HCC)   . Hypertension     History reviewed. No pertinent family history.  Past Surgical History:  Procedure Laterality Date  . ABDOMINAL SURGERY     Social History   Occupational History  . Not on file  Tobacco Use  . Smoking status: Current Every Day Smoker     Packs/day: 1.50  . Smokeless tobacco: Never Used  Substance and Sexual Activity  . Alcohol use: Never  . Drug use: Never  . Sexual activity: Not on file

## 2020-10-07 ENCOUNTER — Telehealth: Payer: Self-pay | Admitting: Family

## 2020-10-07 NOTE — Telephone Encounter (Signed)
Patient called. He would like a RX for a set of crutches. His call back number is [phone]

## 2020-10-10 NOTE — Telephone Encounter (Signed)
Rx written and called pt lm on vm to advise rx is at the desk for pick up.

## 2020-10-19 ENCOUNTER — Ambulatory Visit: Payer: Medicaid Other | Admitting: Family

## 2020-10-22 ENCOUNTER — Emergency Department (HOSPITAL_COMMUNITY): Payer: Medicaid Other

## 2020-10-22 ENCOUNTER — Inpatient Hospital Stay (HOSPITAL_COMMUNITY)
Admission: EM | Admit: 2020-10-22 | Discharge: 2020-11-20 | DRG: 853 | Disposition: E | Payer: Medicaid Other | Attending: Pulmonary Disease | Admitting: Pulmonary Disease

## 2020-10-22 ENCOUNTER — Other Ambulatory Visit: Payer: Self-pay

## 2020-10-22 ENCOUNTER — Encounter (HOSPITAL_COMMUNITY): Payer: Self-pay | Admitting: Emergency Medicine

## 2020-10-22 ENCOUNTER — Inpatient Hospital Stay (HOSPITAL_COMMUNITY): Payer: Medicaid Other

## 2020-10-22 DIAGNOSIS — N390 Urinary tract infection, site not specified: Secondary | ICD-10-CM | POA: Diagnosis present

## 2020-10-22 DIAGNOSIS — K264 Chronic or unspecified duodenal ulcer with hemorrhage: Secondary | ICD-10-CM | POA: Diagnosis not present

## 2020-10-22 DIAGNOSIS — Z885 Allergy status to narcotic agent status: Secondary | ICD-10-CM

## 2020-10-22 DIAGNOSIS — R6521 Severe sepsis with septic shock: Secondary | ICD-10-CM | POA: Diagnosis present

## 2020-10-22 DIAGNOSIS — Z880 Allergy status to penicillin: Secondary | ICD-10-CM | POA: Diagnosis not present

## 2020-10-22 DIAGNOSIS — E11649 Type 2 diabetes mellitus with hypoglycemia without coma: Secondary | ICD-10-CM | POA: Diagnosis not present

## 2020-10-22 DIAGNOSIS — E861 Hypovolemia: Secondary | ICD-10-CM | POA: Diagnosis not present

## 2020-10-22 DIAGNOSIS — A4101 Sepsis due to Methicillin susceptible Staphylococcus aureus: Secondary | ICD-10-CM | POA: Diagnosis present

## 2020-10-22 DIAGNOSIS — E1110 Type 2 diabetes mellitus with ketoacidosis without coma: Secondary | ICD-10-CM | POA: Diagnosis present

## 2020-10-22 DIAGNOSIS — D62 Acute posthemorrhagic anemia: Secondary | ICD-10-CM | POA: Diagnosis not present

## 2020-10-22 DIAGNOSIS — F419 Anxiety disorder, unspecified: Secondary | ICD-10-CM | POA: Diagnosis not present

## 2020-10-22 DIAGNOSIS — N179 Acute kidney failure, unspecified: Secondary | ICD-10-CM | POA: Diagnosis present

## 2020-10-22 DIAGNOSIS — E1069 Type 1 diabetes mellitus with other specified complication: Secondary | ICD-10-CM | POA: Diagnosis not present

## 2020-10-22 DIAGNOSIS — J9601 Acute respiratory failure with hypoxia: Secondary | ICD-10-CM | POA: Diagnosis not present

## 2020-10-22 DIAGNOSIS — J9602 Acute respiratory failure with hypercapnia: Secondary | ICD-10-CM

## 2020-10-22 DIAGNOSIS — Z23 Encounter for immunization: Secondary | ICD-10-CM | POA: Diagnosis not present

## 2020-10-22 DIAGNOSIS — L8990 Pressure ulcer of unspecified site, unspecified stage: Secondary | ICD-10-CM | POA: Insufficient documentation

## 2020-10-22 DIAGNOSIS — B954 Other streptococcus as the cause of diseases classified elsewhere: Secondary | ICD-10-CM | POA: Diagnosis not present

## 2020-10-22 DIAGNOSIS — T797XXA Traumatic subcutaneous emphysema, initial encounter: Secondary | ICD-10-CM | POA: Diagnosis present

## 2020-10-22 DIAGNOSIS — Z6824 Body mass index (BMI) 24.0-24.9, adult: Secondary | ICD-10-CM

## 2020-10-22 DIAGNOSIS — Z20822 Contact with and (suspected) exposure to covid-19: Secondary | ICD-10-CM | POA: Diagnosis present

## 2020-10-22 DIAGNOSIS — A419 Sepsis, unspecified organism: Secondary | ICD-10-CM | POA: Diagnosis not present

## 2020-10-22 DIAGNOSIS — R7881 Bacteremia: Secondary | ICD-10-CM

## 2020-10-22 DIAGNOSIS — F17210 Nicotine dependence, cigarettes, uncomplicated: Secondary | ICD-10-CM | POA: Diagnosis present

## 2020-10-22 DIAGNOSIS — R6520 Severe sepsis without septic shock: Secondary | ICD-10-CM

## 2020-10-22 DIAGNOSIS — L89323 Pressure ulcer of left buttock, stage 3: Secondary | ICD-10-CM | POA: Diagnosis not present

## 2020-10-22 DIAGNOSIS — J439 Emphysema, unspecified: Secondary | ICD-10-CM | POA: Diagnosis present

## 2020-10-22 DIAGNOSIS — I10 Essential (primary) hypertension: Secondary | ICD-10-CM | POA: Diagnosis present

## 2020-10-22 DIAGNOSIS — E1100 Type 2 diabetes mellitus with hyperosmolarity without nonketotic hyperglycemic-hyperosmolar coma (NKHHC): Secondary | ICD-10-CM | POA: Diagnosis present

## 2020-10-22 DIAGNOSIS — R578 Other shock: Secondary | ICD-10-CM | POA: Diagnosis not present

## 2020-10-22 DIAGNOSIS — S76811A Strain of other specified muscles, fascia and tendons at thigh level, right thigh, initial encounter: Secondary | ICD-10-CM | POA: Diagnosis present

## 2020-10-22 DIAGNOSIS — Z791 Long term (current) use of non-steroidal anti-inflammatories (NSAID): Secondary | ICD-10-CM

## 2020-10-22 DIAGNOSIS — E43 Unspecified severe protein-calorie malnutrition: Secondary | ICD-10-CM | POA: Diagnosis present

## 2020-10-22 DIAGNOSIS — M60051 Infective myositis, right thigh: Secondary | ICD-10-CM | POA: Diagnosis present

## 2020-10-22 DIAGNOSIS — J9690 Respiratory failure, unspecified, unspecified whether with hypoxia or hypercapnia: Secondary | ICD-10-CM | POA: Diagnosis not present

## 2020-10-22 DIAGNOSIS — K5791 Diverticulosis of intestine, part unspecified, without perforation or abscess with bleeding: Secondary | ICD-10-CM | POA: Diagnosis not present

## 2020-10-22 DIAGNOSIS — E860 Dehydration: Secondary | ICD-10-CM

## 2020-10-22 DIAGNOSIS — R54 Age-related physical debility: Secondary | ICD-10-CM | POA: Diagnosis present

## 2020-10-22 DIAGNOSIS — B9561 Methicillin susceptible Staphylococcus aureus infection as the cause of diseases classified elsewhere: Secondary | ICD-10-CM | POA: Diagnosis not present

## 2020-10-22 DIAGNOSIS — Z794 Long term (current) use of insulin: Secondary | ICD-10-CM | POA: Diagnosis not present

## 2020-10-22 DIAGNOSIS — E1165 Type 2 diabetes mellitus with hyperglycemia: Secondary | ICD-10-CM | POA: Diagnosis not present

## 2020-10-22 DIAGNOSIS — L02414 Cutaneous abscess of left upper limb: Secondary | ICD-10-CM | POA: Diagnosis present

## 2020-10-22 DIAGNOSIS — N170 Acute kidney failure with tubular necrosis: Secondary | ICD-10-CM | POA: Diagnosis present

## 2020-10-22 DIAGNOSIS — E871 Hypo-osmolality and hyponatremia: Secondary | ICD-10-CM | POA: Diagnosis not present

## 2020-10-22 DIAGNOSIS — Z7984 Long term (current) use of oral hypoglycemic drugs: Secondary | ICD-10-CM

## 2020-10-22 DIAGNOSIS — L02415 Cutaneous abscess of right lower limb: Secondary | ICD-10-CM | POA: Diagnosis present

## 2020-10-22 DIAGNOSIS — D696 Thrombocytopenia, unspecified: Secondary | ICD-10-CM | POA: Diagnosis not present

## 2020-10-22 DIAGNOSIS — L89312 Pressure ulcer of right buttock, stage 2: Secondary | ICD-10-CM | POA: Diagnosis present

## 2020-10-22 DIAGNOSIS — E119 Type 2 diabetes mellitus without complications: Secondary | ICD-10-CM

## 2020-10-22 DIAGNOSIS — L89322 Pressure ulcer of left buttock, stage 2: Secondary | ICD-10-CM | POA: Diagnosis present

## 2020-10-22 DIAGNOSIS — I96 Gangrene, not elsewhere classified: Secondary | ICD-10-CM | POA: Diagnosis present

## 2020-10-22 DIAGNOSIS — G9341 Metabolic encephalopathy: Secondary | ICD-10-CM | POA: Diagnosis not present

## 2020-10-22 DIAGNOSIS — G4700 Insomnia, unspecified: Secondary | ICD-10-CM | POA: Diagnosis not present

## 2020-10-22 DIAGNOSIS — Z800 Family history of malignant neoplasm of digestive organs: Secondary | ICD-10-CM

## 2020-10-22 DIAGNOSIS — E876 Hypokalemia: Secondary | ICD-10-CM | POA: Diagnosis not present

## 2020-10-22 DIAGNOSIS — J0510 Acute epiglottitis without obstruction: Secondary | ICD-10-CM

## 2020-10-22 DIAGNOSIS — R739 Hyperglycemia, unspecified: Secondary | ICD-10-CM

## 2020-10-22 DIAGNOSIS — Z515 Encounter for palliative care: Secondary | ICD-10-CM | POA: Diagnosis not present

## 2020-10-22 DIAGNOSIS — K567 Ileus, unspecified: Secondary | ICD-10-CM

## 2020-10-22 DIAGNOSIS — Z79899 Other long term (current) drug therapy: Secondary | ICD-10-CM

## 2020-10-22 DIAGNOSIS — E440 Moderate protein-calorie malnutrition: Secondary | ICD-10-CM | POA: Insufficient documentation

## 2020-10-22 DIAGNOSIS — R509 Fever, unspecified: Secondary | ICD-10-CM | POA: Diagnosis not present

## 2020-10-22 DIAGNOSIS — E1169 Type 2 diabetes mellitus with other specified complication: Secondary | ICD-10-CM | POA: Diagnosis not present

## 2020-10-22 DIAGNOSIS — T17908A Unspecified foreign body in respiratory tract, part unspecified causing other injury, initial encounter: Secondary | ICD-10-CM

## 2020-10-22 DIAGNOSIS — R531 Weakness: Secondary | ICD-10-CM | POA: Diagnosis present

## 2020-10-22 DIAGNOSIS — K5900 Constipation, unspecified: Secondary | ICD-10-CM | POA: Diagnosis not present

## 2020-10-22 LAB — URINALYSIS, ROUTINE W REFLEX MICROSCOPIC
Bilirubin Urine: NEGATIVE
Glucose, UA: >=500 mg/dL — AB
Ketones, ur: 5 mg/dL — AB
Nitrite: NEGATIVE
Protein, ur: 30 mg/dL — AB
RBC / HPF: >50 RBC/hpf — ABNORMAL HIGH (ref 0–5)
Specific Gravity, Urine: 1.018 (ref 1.005–1.030)
WBC, UA: >50 WBC/hpf — ABNORMAL HIGH (ref 0–5)
pH: 5 (ref 5.0–8.0)

## 2020-10-22 LAB — COMPREHENSIVE METABOLIC PANEL
ALT: 13 U/L (ref 0–44)
AST: 14 U/L — ABNORMAL LOW (ref 15–41)
Albumin: 2.1 g/dL — ABNORMAL LOW (ref 3.5–5.0)
Alkaline Phosphatase: 91 U/L (ref 38–126)
Anion gap: 20 — ABNORMAL HIGH (ref 5–15)
BUN: 25 mg/dL — ABNORMAL HIGH (ref 6–20)
CO2: 27 mmol/L (ref 22–32)
Calcium: 8.3 mg/dL — ABNORMAL LOW (ref 8.9–10.3)
Chloride: 82 mmol/L — ABNORMAL LOW (ref 98–111)
Creatinine, Ser: 1.92 mg/dL — ABNORMAL HIGH (ref 0.61–1.24)
GFR, Estimated: 40 mL/min — ABNORMAL LOW (ref >60)
Glucose, Bld: 980 mg/dL (ref 70–99)
Potassium: 3.5 mmol/L (ref 3.5–5.1)
Sodium: 129 mmol/L — ABNORMAL LOW (ref 135–145)
Total Bilirubin: 1.1 mg/dL (ref 0.3–1.2)
Total Protein: 7.1 g/dL (ref 6.5–8.1)

## 2020-10-22 LAB — CBG MONITORING, ED
Glucose-Capillary: 175 mg/dL — ABNORMAL HIGH (ref 70–99)
Glucose-Capillary: 307 mg/dL — ABNORMAL HIGH (ref 70–99)
Glucose-Capillary: 363 mg/dL — ABNORMAL HIGH (ref 70–99)
Glucose-Capillary: 432 mg/dL — ABNORMAL HIGH (ref 70–99)
Glucose-Capillary: 506 mg/dL (ref 70–99)
Glucose-Capillary: 545 mg/dL (ref 70–99)
Glucose-Capillary: >600 mg/dL (ref 70–99)
Glucose-Capillary: >600 mg/dL (ref 70–99)
Glucose-Capillary: >600 mg/dL (ref 70–99)

## 2020-10-22 LAB — CBC
HCT: 34.2 % — ABNORMAL LOW (ref 39.0–52.0)
Hemoglobin: 10.3 g/dL — ABNORMAL LOW (ref 13.0–17.0)
MCH: 27.7 pg (ref 26.0–34.0)
MCHC: 30.1 g/dL (ref 30.0–36.0)
MCV: 91.9 fL (ref 80.0–100.0)
Platelets: 438 10*3/uL — ABNORMAL HIGH (ref 150–400)
RBC: 3.72 MIL/uL — ABNORMAL LOW (ref 4.22–5.81)
RDW: 15.5 % (ref 11.5–15.5)
WBC: 13.4 10*3/uL — ABNORMAL HIGH (ref 4.0–10.5)
nRBC: 0 % (ref 0.0–0.2)

## 2020-10-22 LAB — RESP PANEL BY RT-PCR (FLU A&B, COVID) ARPGX2
Influenza A by PCR: NEGATIVE
Influenza B by PCR: NEGATIVE
SARS Coronavirus 2 by RT PCR: NEGATIVE

## 2020-10-22 LAB — BASIC METABOLIC PANEL
Anion gap: 13 (ref 5–15)
BUN: 22 mg/dL — ABNORMAL HIGH (ref 6–20)
CO2: 26 mmol/L (ref 22–32)
Calcium: 7.8 mg/dL — ABNORMAL LOW (ref 8.9–10.3)
Chloride: 97 mmol/L — ABNORMAL LOW (ref 98–111)
Creatinine, Ser: 1.14 mg/dL (ref 0.61–1.24)
GFR, Estimated: >60 mL/min (ref >60)
Glucose, Bld: 251 mg/dL — ABNORMAL HIGH (ref 70–99)
Potassium: 2.3 mmol/L — CL (ref 3.5–5.1)
Sodium: 136 mmol/L (ref 135–145)

## 2020-10-22 LAB — LACTIC ACID, PLASMA
Lactic Acid, Venous: 3.7 mmol/L (ref 0.5–1.9)
Lactic Acid, Venous: 3.8 mmol/L (ref 0.5–1.9)
Lactic Acid, Venous: 7.2 mmol/L (ref 0.5–1.9)

## 2020-10-22 LAB — CK: Total CK: 14 U/L — ABNORMAL LOW (ref 49–397)

## 2020-10-22 LAB — BETA-HYDROXYBUTYRIC ACID: Beta-Hydroxybutyric Acid: 3.2 mmol/L — ABNORMAL HIGH (ref 0.05–0.27)

## 2020-10-22 MED ORDER — DEXTROSE 50 % IV SOLN: 0.0000 mL | INTRAVENOUS | Status: DC | PRN

## 2020-10-22 MED ORDER — LACTATED RINGERS IV BOLUS
1000.0000 mL | Freq: Once | INTRAVENOUS | Status: AC
  Administered 2020-10-22: 1000 mL via INTRAVENOUS

## 2020-10-22 MED ORDER — CLINDAMYCIN PHOSPHATE 600 MG/50ML IV SOLN
600.0000 mg | Freq: Three times a day (TID) | INTRAVENOUS | Status: DC
  Administered 2020-10-23 (×2): 600 mg via INTRAVENOUS
  Filled 2020-10-22 (×2): qty 50

## 2020-10-22 MED ORDER — LACTATED RINGERS IV BOLUS
1000.0000 mL | Freq: Once | INTRAVENOUS | Status: AC
  Administered 2020-10-23: 1000 mL via INTRAVENOUS

## 2020-10-22 MED ORDER — SODIUM CHLORIDE 0.9 % IV SOLN
2.0000 g | Freq: Two times a day (BID) | INTRAVENOUS | Status: DC
  Administered 2020-10-23: 2 g via INTRAVENOUS
  Filled 2020-10-22 (×3): qty 2

## 2020-10-22 MED ORDER — TETANUS-DIPHTH-ACELL PERTUSSIS 5-2.5-18.5 LF-MCG/0.5 IM SUSY
0.5000 mL | PREFILLED_SYRINGE | Freq: Once | INTRAMUSCULAR | Status: AC
  Administered 2020-10-22: 0.5 mL via INTRAMUSCULAR
  Filled 2020-10-22: qty 0.5

## 2020-10-22 MED ORDER — POTASSIUM CHLORIDE 10 MEQ/100ML IV SOLN: 10.0000 meq | INTRAVENOUS | Status: DC

## 2020-10-22 MED ORDER — LACTATED RINGERS IV SOLN: INTRAVENOUS | Status: DC

## 2020-10-22 MED ORDER — POTASSIUM CHLORIDE CRYS ER 20 MEQ PO TBCR
40.0000 meq | EXTENDED_RELEASE_TABLET | Freq: Once | ORAL | Status: DC
  Filled 2020-10-22 (×2): qty 2

## 2020-10-22 MED ORDER — POTASSIUM CHLORIDE 10 MEQ/100ML IV SOLN
10.0000 meq | INTRAVENOUS | Status: AC
  Administered 2020-10-23 (×2): 10 meq via INTRAVENOUS
  Filled 2020-10-22 (×2): qty 100

## 2020-10-22 MED ORDER — VANCOMYCIN HCL IN DEXTROSE 1-5 GM/200ML-% IV SOLN
1000.0000 mg | Freq: Once | INTRAVENOUS | Status: AC
  Administered 2020-10-22: 1000 mg via INTRAVENOUS
  Filled 2020-10-22: qty 200

## 2020-10-22 MED ORDER — LIDOCAINE-EPINEPHRINE (PF) 2 %-1:200000 IJ SOLN
20.0000 mL | Freq: Once | INTRAMUSCULAR | Status: AC
  Administered 2020-10-22: 20 mL
  Filled 2020-10-22: qty 20

## 2020-10-22 MED ORDER — SODIUM CHLORIDE 0.9 % IV BOLUS
1000.0000 mL | Freq: Once | INTRAVENOUS | Status: AC
  Administered 2020-10-22: 1000 mL via INTRAVENOUS

## 2020-10-22 MED ORDER — DEXTROSE IN LACTATED RINGERS 5 % IV SOLN: INTRAVENOUS | Status: DC

## 2020-10-22 MED ORDER — DEXTROSE 50 % IV SOLN
0.0000 mL | INTRAVENOUS | Status: DC | PRN
  Administered 2020-11-03: 50 mL via INTRAVENOUS
  Filled 2020-10-22 (×2): qty 50

## 2020-10-22 MED ORDER — CLINDAMYCIN PHOSPHATE 600 MG/50ML IV SOLN
600.0000 mg | Freq: Once | INTRAVENOUS | Status: AC
  Administered 2020-10-22: 600 mg via INTRAVENOUS
  Filled 2020-10-22: qty 50

## 2020-10-22 MED ORDER — SODIUM CHLORIDE 0.9 % IV SOLN
1.0000 g | Freq: Once | INTRAVENOUS | Status: AC
  Administered 2020-10-22: 1 g via INTRAVENOUS
  Filled 2020-10-22: qty 1

## 2020-10-22 MED ORDER — INSULIN REGULAR(HUMAN) IN NACL 100-0.9 UT/100ML-% IV SOLN
INTRAVENOUS | Status: DC
  Administered 2020-10-23: 1.2 [IU]/h via INTRAVENOUS

## 2020-10-22 MED ORDER — INSULIN REGULAR(HUMAN) IN NACL 100-0.9 UT/100ML-% IV SOLN
INTRAVENOUS | Status: DC
Start: 2020-10-22 — End: 2020-10-22
  Administered 2020-10-22: 10 [IU]/h via INTRAVENOUS
  Filled 2020-10-22: qty 100

## 2020-10-22 MED ORDER — HEPARIN SODIUM (PORCINE) 5000 UNIT/ML IJ SOLN
5000.0000 [IU] | Freq: Three times a day (TID) | INTRAMUSCULAR | Status: DC
  Administered 2020-10-23 – 2020-10-24 (×5): 5000 [IU] via SUBCUTANEOUS
  Filled 2020-10-22 (×5): qty 1

## 2020-10-22 MED ORDER — FLUCONAZOLE IN SODIUM CHLORIDE 200-0.9 MG/100ML-% IV SOLN
200.0000 mg | Freq: Once | INTRAVENOUS | Status: AC
  Administered 2020-10-23: 200 mg via INTRAVENOUS
  Filled 2020-10-22 (×2): qty 100

## 2020-10-22 NOTE — ED Notes (Signed)
Pt is alert and talking at this time. Denies any needs. Pt pressure in trending down so made the EDP aware and order for LR bolus to be given. Will continue to monitor pt.

## 2020-10-22 NOTE — H&P (Signed)
History and Physical    CLETIS CLACK VOZ:[id number] DOB: 12-09-1960 DOA: 11/07/2020  PCP: Tanna Furry, MD   Patient coming from: Home   I have personally briefly reviewed patient's old medical records in [facility]  Chief Complaint: Abscess right thigh, left wrist  HPI: JUSTINO BOZE is a 60 y.o. male with medical history significant for  DM, HTN, emphysema.  Patient was brought to the ED via EMS after he was found by patient's sister at home.  Patient had been sitting in a chair for 4 days unable to get up.  He was found covered in feces and urine.  Patient reports that he had torn hamstring which prevented him from getting up from a chair, this injury occurred about 3 weeks ago. Over the past 4 days he not eating anything, or taking any of his insulins.  Tells me temperature in the house was about 75 degrees.  He denies pain with urination.  Denies chest pain. History is limited from patient as patient's voice is very hoarse, patient reports this started here in the ED.  ED Course: Hypothermic - temperature 95.7, tachycardic to 121, hypotensive blood pressure dropping to 81/56 improved after 4 L bolus given, systolic now 102/71.  Lactic acidosis of 3.7 > 3.8.  UA with moderate leukocytes, yeast.  Draining abscess noted to right thigh, and left wrist.  WBC 13.4.  Creatinine elevated 1.9.  Blood glucose 980.  Serum bicarb 27, anion gap of 20. CT right femur-shows elongated gas and fluid collection along surface of hamstring muscles extending deep into the hamstring muscle compatible with intramuscular abscess. Insulin drip started, clindamycin started.  I and D both done at bedside in the Ed by EDP. EDP talked to critical care, okay for admission here at [facility], under hospitalist service.  Review of Systems: As per HPI all other systems reviewed and negative.  Past Medical History:  Diagnosis Date  . Diabetes mellitus without complication (HCC)   . Emphysema  lung (HCC)   . Hypertension     Past Surgical History:  Procedure Laterality Date  . ABDOMINAL SURGERY       reports that he has been smoking. He has been smoking about 1.50 packs per day. He has never used smokeless tobacco. He reports that he does not drink alcohol and does not use drugs.  Allergies  Allergen Reactions  . Penicillins Other (See Comments)    Pt is unsure of reaction. States PCP told him he was allergic. Did it involve swelling of the face/tongue/throat, SOB, or low BP? Unknown Did it involve sudden or severe rash/hives, skin peeling, or any reaction on the inside of your mouth or nose? Unknown Did you need to seek medical attention at a hospital or doctor's office? Unknown When did it last happen?unk If all above answers are "NO", may proceed with cephalosporin use.     Family history of hypertension.  Prior to Admission medications   Medication Sig Start Date End Date Taking? Authorizing Provider  albuterol (VENTOLIN HFA) 108 (90 Base) MCG/ACT inhaler Inhale 2 puffs into the lungs every 4 (four) hours as needed for wheezing or shortness of breath. 05/26/19   [provider]  DULoxetine (CYMBALTA) 60 MG capsule Take 60 mg by mouth daily. 05/26/19   [provider]  HUMALOG KWIKPEN 100 UNIT/ML KwikPen Inject 16 Units into the skin 3 (three) times daily with meals.  05/27/19   [provider]  HYDROcodone-acetaminophen (NORCO) 5-325 MG tablet  Take 1 tablet by mouth every 6 (six) hours as needed. 09/28/20   Adonis Huguenin, NP  ibuprofen (ADVIL) 600 MG tablet Take 1 tablet (600 mg total) by mouth every 8 (eight) hours as needed for moderate pain. 09/05/20   Terrilee Files, MD  LEVEMIR 100 UNIT/ML injection Inject 23 Units into the skin 2 (two) times daily. 05/26/19   [provider]  meloxicam (MOBIC) 15 MG tablet Take 1 tablet (15 mg total) by mouth daily. 09/28/20   Adonis Huguenin, NP  nicotine (NICODERM CQ - DOSED IN MG/24 HOURS)  14 mg/24hr patch Place 1 patch (14 mg total) onto the skin daily. 08/23/19   Pokhrel, Rebekah Chesterfield, MD  pravastatin (PRAVACHOL) 20 MG tablet Take 20 mg by mouth at bedtime. 05/26/19   [provider]  sitaGLIPtin (JANUVIA) 50 MG tablet Take 50 mg by mouth daily.    [provider]  SPIRIVA HANDIHALER 18 MCG inhalation capsule Place 18 mcg into inhaler and inhale daily. 05/26/19   [provider]  SYMBICORT 160-4.5 MCG/ACT inhaler Inhale 2 puffs into the lungs 2 (two) times daily. 05/26/19   [provider]  tadalafil (CIALIS) 10 MG tablet Take 10 mg by mouth daily as needed for erectile dysfunction.    [provider]  traZODone (DESYREL) 100 MG tablet Take 100 mg by mouth at bedtime. 05/26/19   [provider]    Physical Exam: Vitals:   11/17/2020 2110 10/21/2020 2120 11/04/2020 2130 11/14/2020 2150  BP: (!) 88/58 101/67 (!) 87/61 102/71  Pulse: (!) 111 (!) 109 (!) 110 (!) 112  Resp: (!) 25 (!) 26 (!) 26 (!) 27  Temp:      TempSrc:      SpO2: 96% 96% 96% 100%  Weight:      Height:        Constitutional: Acutely ill-appearing,calm, comfortable Vitals:   11/18/2020 2110 11/10/2020 2120 10/30/2020 2130 11/07/2020 2150  BP: (!) 88/58 101/67 (!) 87/61 102/71  Pulse: (!) 111 (!) 109 (!) 110 (!) 112  Resp: (!) 25 (!) 26 (!) 26 (!) 27  Temp:      TempSrc:      SpO2: 96% 96% 96% 100%  Weight:      Height:       Eyes: PERRL, lids and conjunctivae normal ENMT: Mucous membranes are dry Neck: normal, supple, no masses, no thyromegaly Respiratory: clear to auscultation bilaterally, no wheezing, no crackles. Normal respiratory effort. No accessory muscle use.  Cardiovascular: Tachycardic, regular rate and rhythm, no murmurs / rubs / gallops. No extremity edema. 2+ pedal pulses.   Abdomen: no tenderness, no masses palpated. No hepatosplenomegaly. Bowel sounds positive.  Cloudy urine with sediments draining from foley. Musculoskeletal: no clubbing / cyanosis. No  joint deformity upper and lower extremities. Good ROM, no contractures. Normal muscle tone.  Skin: Open wounds/abscess to posterior thigh- 5cm diameter , and left wrist- 3cm . S/p  I and D. Both packed and dressed in the ED.   Neurologic: No apparent cranial abnormality, moving extremities spontaneously. Psychiatric: Normal judgment and insight. Alert and oriented x 3. Normal mood.       Labs on Admission: I have personally reviewed following labs and imaging studies  CBC: Recent Labs  Lab 11/06/2020 1612  WBC 13.4*  HGB 10.3*  HCT 34.2*  MCV 91.9  PLT 438*   Basic Metabolic Panel: Recent Labs  Lab 10/31/2020 1612  NA 129*  K 3.5  CL 82*  CO2 27  GLUCOSE 980*  BUN 25*  CREATININE 1.92*  CALCIUM 8.3*   GFR: Estimated Creatinine Clearance: 36.5 mL/min (A) (by C-G formula based on SCr of 1.92 mg/dL (H)). Liver Function Tests: Recent Labs  Lab 11/12/2020 1612  AST 14*  ALT 13  ALKPHOS 91  BILITOT 1.1  PROT 7.1  ALBUMIN 2.1*   Cardiac Enzymes: Recent Labs  Lab 11/15/2020 1612  CKTOTAL 14*   CBG: Recent Labs  Lab 10/29/2020 1858 11/11/2020 1947 11/04/2020 2020 11/03/2020 2049 10/25/2020 2121  GLUCAP >600* 545* 506* 432* 363*   Urine analysis:    Component Value Date/Time   COLORURINE YELLOW 11/11/2020 1815   APPEARANCEUR CLOUDY (A) 10/23/2020 1815   LABSPEC 1.018 11/09/2020 1815   PHURINE 5.0 10/21/2020 1815   GLUCOSEU >=500 (A) 10/31/2020 1815   HGBUR SMALL (A) 11/14/2020 1815   BILIRUBINUR NEGATIVE 11/06/2020 1815   KETONESUR 5 (A) 11/17/2020 1815   PROTEINUR 30 (A) 11/04/2020 1815   NITRITE NEGATIVE 11/18/2020 1815   LEUKOCYTESUR MODERATE (A) 11/12/2020 1815    Radiological Exams on Admission: DG Forearm Left  Result Date: 10/21/2020 CLINICAL DATA:  Infected wound to the distal left forearm. EXAM: LEFT FOREARM - 2 VIEW COMPARISON:  09/05/2020 FINDINGS: Intravenous catheter in the antecubital fossa region. Decreased soft tissue swelling since previous study.  No soft tissue gas. No evidence of acute fracture or dislocation. No focal bone lesion or bone destruction. Degenerative changes in the wrist. IMPRESSION: Decreased soft tissue swelling since previous study. No acute bony abnormalities. Electronically Signed   By: Burman NievesWilliam  Stevens M.D.   On: 10/27/2020 20:09   CT FEMUR RIGHT WO CONTRAST  Result Date: 11/19/2020 CLINICAL DATA:  Pain and swelling in right thigh. Soft tissue infection suspected. EXAM: CT OF THE LOWER RIGHT EXTREMITY WITHOUT CONTRAST TECHNIQUE: Multidetector CT imaging of the right lower extremity was performed according to the standard protocol. COMPARISON:  None. FINDINGS: Foley catheter present in the bladder. Large stool burden in the visualized rectum raising the possibility of fecal impaction. Recommend clinical correlation. Mild prostate enlargement with calcifications. Large soft tissue defect noted in the posteromedial upper right thigh. The defect extends down to the muscles. Gas and fluid noted within the soft tissues along the surface of the hamstring muscles extending from the soft tissue defect in the proximal thigh distally to just above the knee concerning for abscess. It is difficult to determine if this is within the hamstring muscles or along the surface of the muscle. Largest collection of gas and fluid seen on axial image 187 of series 7 measures approximately 3.3 x 2.1 cm. There are some locules of gas which extend deep into the musculature (image 166, 154, 188) compatible with intramuscular abscesses. Extensive vascular calcifications. No acute bony abnormality. No fracture. No bone destruction to suggest osteomyelitis. IMPRESSION: Elongated gas and fluid collection noted along the surface of the hamstring muscles, in some areas extending deep into the hamstring muscles compatible with intramuscular abscess. This extends over an extensive area in length extending from the soft tissue defect in the proximal posteromedial right  thigh distally to just above the knee. No evidence of osteomyelitis. Electronically Signed   By: Charlett NoseKevin  Dover M.D.   On: 10/25/2020 20:25   DG Chest Port 1 View  Result Date: 11/03/2020 CLINICAL DATA:  60 year old male with weakness. EXAM: PORTABLE CHEST 1 VIEW COMPARISON:  Chest radiograph dated 08/18/2019 FINDINGS: Background of chronic interstitial coarsening and bronchitic changes. No focal consolidation, pleural effusion, or pneumothorax. The cardiac silhouette  is within limits. No acute osseous pathology. IMPRESSION: No acute cardiopulmonary process. Electronically Signed   By: Elgie Collard M.D.   On: 11/09/2020 17:09    EKG: Independently reviewed.  Tachycardia rate 117, QTC 454.  Artifacts present.  No significant change from prior EKG.  Assessment/Plan Principal Problem:   Severe sepsis (HCC) Active Problems:   Hyperosmolar hyperglycemic state (HHS) (HCC)   AKI (acute kidney injury) (HCC)   Acute lower UTI   Abscess of right thigh   Abscess of left forearm   Diabetes mellitus (HCC)   Emphysema lung (HCC)   Severe sepsis-tachycardic to 121, tachypneic, hypothermic temperature 95.7, leukocytosis 13.4, systolic blood pressure down to 81, fluid responsive.  With evidence of endorgan dysfunction with lactic acidosis of 3.7 > 7.2 and acute kidney injury.  Focus of infection likely high and wrist abscess, possibly UTI also. -Broad-spectrum antibiotics meropenem and clindamycin was started ED, continue both considering CT findings of gas in the tissue. -Follow-up blood cultures, obtain urine cultures -IV fluids - Unfortunately Cultures were not obtained from I&D procedure in the ED. -Trend lactic acid - Continue warming blanket   Right thigh abscess, left forearm abscess-CT of right femur - elongated gas and fluid collection along surface of hamstring muscles extending deep into hamstring muscle compatible with intramuscular abscess.  Extensive area involvement -proximal posterior  medial right thigh distally to just above the knee.  X-ray right wrist without acute abnormality.  -I&D done in the ED at [facility] -I talked to general surgeon at [facility], Dr. Henreitta Leber recommended talking to orthopedist.  Talked to Dr. Garlon Hatchet on-call at [facility], at this time patient does not need any procedure for abscess, as he has an open wound that is draining.  Will see patient in the morning. - Will obtain CT of his left forearm also  Hyperosmolar hyperglycemic state in diabetic-glucose 980 > 307.History of uncontrolled diabetes.  No insulins in at least 4 days.  He is on Levemir and Humalog.  Severe dehydration likely contributing to lactic acidosis. -Obtain hemoglobin A1c -Continue insulin drip - 5 L  bolus normal saline and LR given - Cont L/R 125 cc/hr  -Monitor electrolytes closely -Serum osmolality -DC insulin drip and start sliding scale when blood sugar less than 250.  Urinary tract infection-UA with moderate leukocytes, rare bacteria, yeast present. -Broad-spectrum antibiotic - IV fluconazole 150 mg x 1 -Follow-up urine cultures  Acute kidney injury-creatinine 1.9, baseline  ~0.8.  Likely due to combination of dehydration and severe sepsis. -IV fluids -BMP in the morning  Pseudohyponatremia-129, corrected sodium 143.   DVT prophylaxis: heparin Code Status: Full code Family Communication: None at bedside Disposition Plan: > 2 days, pending resolution of sepsis, hyperglycemia, evaluation and management of abscess Consults called: Orthopedics Admission status: Inpt, Strep down I certify that at the point of admission it is my clinical judgment that the patient will require inpatient hospital care spanning beyond 2 midnights from the point of admission due to high intensity of service, high risk for further deterioration and high frequency of surveillance required.    Onnie Boer MD [facility]  11/12/2020, 11:39 PM

## 2020-10-22 NOTE — ED Notes (Signed)
Wound noted to pts left wrist. Site is swollen, red and draining. EDP made aware.

## 2020-10-22 NOTE — ED Triage Notes (Signed)
Pt brought to ED via [facility]. Pt sister came to check on him and called EMS. Pt had been found and sitting in chair x 4 days, covered in feces. Pt states he was unable to get back up. Pt states he has a torn hamstring. Pt lives alone.

## 2020-10-22 NOTE — ED Provider Notes (Signed)
[facility] EMERGENCY DEPARTMENT Provider Note   CSN: [id number] Arrival date & time: 11/15/2020  1542     History Chief Complaint  Patient presents with  . Weakness    Ronald Brady is a 60 y.o. male.  Patient presents via EMS after family member found in chair, unable to get up under his own power. Pt reports injury to right hamstring area a couple weeks ago, states felt a pop in area, with constant pain since. States in past 3-4 days has been in chair, unable to get up under own power, due to pain, with no food/drink during that time. Feels generally weak. Pt also noted with foul smelling, draining wound to right proximal posterior upper leg, w surrounding redness, and brownish fluid draining. Denies fever/chills. No nv. Denies head injury or headache. No focal or unilateral numbness or weakness, notes general weakness. Pt also notes swollen, painful area to left wrist for three weeks, and states has also begun draining foul smelling liquid, with small amount tissue from wound. States remote hx fracture to area. Denies recent injury to area.   The history is provided by the patient and the EMS personnel. The history is limited by the condition of the patient.  Weakness Associated symptoms: no abdominal pain, no chest pain, no cough, no diarrhea, no dysuria, no fever, no headaches, no nausea, no shortness of breath and no vomiting        Past Medical History:  Diagnosis Date  . Diabetes mellitus without complication (HCC)   . Emphysema lung (HCC)   . Hypertension     Patient Active Problem List   Diagnosis Date Noted  . 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 ABDOMINAL SURGERY         No family history on file.  Social History   Tobacco Use  . Smoking status: Current Every Day Smoker    Packs/day: 1.50  . Smokeless tobacco: Never Used  Substance Use Topics  . Alcohol use: Never  . Drug use: Never    Home  Medications Prior to Admission medications   Medication Sig Start Date End Date Taking? Authorizing Provider  albuterol (VENTOLIN HFA) 108 (90 Base) MCG/ACT inhaler Inhale 2 puffs into the lungs every 4 (four) hours as needed for wheezing or shortness of breath. 05/26/19   [provider]  DULoxetine (CYMBALTA) 60 MG capsule Take 60 mg by mouth daily. 05/26/19   [provider]  HUMALOG KWIKPEN 100 UNIT/ML KwikPen Inject 16 Units into the skin 3 (three) times daily with meals.  05/27/19   [provider]  HYDROcodone-acetaminophen (NORCO) 5-325 MG tablet Take 1 tablet by mouth every 6 (six) hours as needed. 09/28/20   Adonis Huguenin, NP  ibuprofen (ADVIL) 600 MG tablet Take 1 tablet (600 mg total) by mouth every 8 (eight) hours as needed for moderate pain. 09/05/20   Terrilee Files, MD  LEVEMIR 100 UNIT/ML injection Inject 23 Units into the skin 2 (two) times daily. 05/26/19   [provider]  meloxicam (MOBIC) 15 MG tablet Take 1 tablet (15 mg total) by mouth daily. 09/28/20   Adonis Huguenin, NP  nicotine (NICODERM CQ - DOSED IN MG/24 HOURS) 14 mg/24hr patch Place 1 patch (14 mg total) onto the skin daily. 08/23/19   Pokhrel, Rebekah Chesterfield, MD  pravastatin (PRAVACHOL) 20 MG tablet Take 20 mg by mouth at bedtime. 05/26/19   [provider]  sitaGLIPtin (JANUVIA) 50 MG  tablet Take 50 mg by mouth daily.    [provider]  SPIRIVA HANDIHALER 18 MCG inhalation capsule Place 18 mcg into inhaler and inhale daily. 05/26/19   [provider]  SYMBICORT 160-4.5 MCG/ACT inhaler Inhale 2 puffs into the lungs 2 (two) times daily. 05/26/19   [provider]  tadalafil (CIALIS) 10 MG tablet Take 10 mg by mouth daily as needed for erectile dysfunction.    [provider]  traZODone (DESYREL) 100 MG tablet Take 100 mg by mouth at bedtime. 05/26/19   [provider]    Allergies    Penicillins  Review of Systems   Review of Systems   Constitutional: Negative for chills and fever.  HENT: Negative for sore throat.   Eyes: Negative for redness.  Respiratory: Negative for cough and shortness of breath.   Cardiovascular: Negative for chest pain.  Gastrointestinal: Negative for abdominal pain, diarrhea, nausea and vomiting.  Genitourinary: Negative for dysuria and flank pain.  Musculoskeletal: Negative for back pain and neck pain.  Skin: Negative for rash.  Neurological: Positive for weakness. Negative for numbness and headaches.  Hematological: Does not bruise/bleed easily.  Psychiatric/Behavioral: Negative for agitation.    Physical Exam Updated Vital Signs BP (!) 89/59 (BP Location: Left Arm)   Pulse (!) 121   Resp (!) 29   Ht 1.651 m ( )   Wt 68 kg   SpO2 99%   BMI 24.96 kg/m   Physical Exam   Vitals and nursing note reviewed.  Constitutional:      Appearance: Normal appearance. He is well-developed.  HENT:     Head: Atraumatic.     Nose: Nose normal.     Mouth/Throat:     Mouth: Mucous membranes are moist.     Pharynx: Oropharynx is clear.  Eyes:     General: No scleral icterus.    Conjunctiva/sclera: Conjunctivae normal.     Pupils: Pupils are equal, round, and reactive to light.  Neck:     Trachea: No tracheal deviation.     Comments: No stiffness or rigidity. Cardiovascular:     Rate and Rhythm: Regular rhythm. Tachycardia present.     Pulses: Normal pulses.     Heart sounds: Normal heart sounds. No murmur heard. No friction rub. No gallop.   Pulmonary:     Effort: Pulmonary effort is normal. No accessory muscle usage or respiratory distress.     Breath sounds: Normal breath sounds.  Abdominal:     General: Bowel sounds are normal. There is no distension.     Palpations: Abdomen is soft.     Tenderness: There is no abdominal tenderness. There is no guarding.  Genitourinary:    Comments: No cva tenderness. Musculoskeletal:        General: No swelling.     Cervical back: Normal  range of motion and neck supple. No rigidity.     Comments: Open wound to right proximal, posterior upper leg, draining foul smelling, yellowish-brown liquid, with surrounding erythema to area. No crepitus. Distal pulses palp. Left wrist/proximal to wrist, diffusely swollen and tender, small area with whitish tissue protruding and brownish purulent, foul smelling liquid draining, 2nd area of open draining wound on ulnar aspect wrist. Compartments of forearm, soft, not tense, no crepitus. No other areas of focal pain or tenderness on extremity exam. CTLS spine, non tender, aligned, no step off.   Skin:    General: Skin is warm and dry.     Findings: No rash.  Neurological:     Mental Status: He is alert.     Comments: Alert, speech clear. Motor/sens grossly intact bil.   Psychiatric:        Mood and Affect: Mood normal.     ED Results / Procedures / Treatments   Labs (all labs ordered are listed, but only abnormal results are displayed) Results for orders placed or performed during the hospital encounter of 10/21/2020  Blood culture (routine x 2)   Specimen: Right Antecubital; Blood  Result Value Ref Range   Specimen Description      RIGHT ANTECUBITAL BOTTLES DRAWN AEROBIC AND ANAEROBIC   Special Requests      Blood Culture results may not be optimal due to an inadequate volume of blood received in culture bottles Performed at [facility], [address], [place], [place] [address]    Culture PENDING    Report Status PENDING   Blood culture (routine x 2)   Specimen: Left Antecubital; Blood  Result Value Ref Range   Specimen Description      LEFT ANTECUBITAL BOTTLES DRAWN AEROBIC AND ANAEROBIC   Special Requests      Blood Culture adequate volume Performed at [facility], [address], [place], [place] [address]    Culture PENDING    Report Status PENDING   Resp Panel by RT-PCR (Flu A&B, Covid) Nasopharyngeal Swab   Specimen: Nasopharyngeal Swab; Nasopharyngeal(NP) swabs in  vial transport medium  Result Value Ref Range   SARS Coronavirus 2 by RT PCR NEGATIVE NEGATIVE   Influenza A by PCR NEGATIVE NEGATIVE   Influenza B by PCR NEGATIVE NEGATIVE  CBC  Result Value Ref Range   WBC 13.4 (H) 4.0 - 10.5 K/uL   RBC 3.72 (L) 4.22 - 5.81 MIL/uL   Hemoglobin 10.3 (L) 13.0 - 17.0 g/dL   HCT 57.0 (L) 17.7 - 93.9 %   MCV 91.9 80.0 - 100.0 fL   MCH 27.7 26.0 - 34.0 pg   MCHC 30.1 30.0 - 36.0 g/dL   RDW 03.0 09.2 - 33.0 %   Platelets 438 (H) 150 - 400 K/uL   nRBC 0.0 0.0 - 0.2 %  Comprehensive metabolic panel  Result Value Ref Range   Sodium 129 (L) 135 - 145 mmol/L   Potassium 3.5 3.5 - 5.1 mmol/L   Chloride 82 (L) 98 - 111 mmol/L   CO2 27 22 - 32 mmol/L   Glucose, Bld 980 (HH) 70 - 99 mg/dL   BUN 25 (H) 6 - 20 mg/dL   Creatinine, Ser 0.76 (H) 0.61 - 1.24 mg/dL   Calcium 8.3 (L) 8.9 - 10.3 mg/dL   Total Protein 7.1 6.5 - 8.1 g/dL   Albumin 2.1 (L) 3.5 - 5.0 g/dL   AST 14 (L) 15 - 41 U/L   ALT 13 0 - 44 U/L   Alkaline Phosphatase 91 38 - 126 U/L   Total Bilirubin 1.1 0.3 - 1.2 mg/dL   GFR, Estimated 40 (L) >60 mL/min   Anion gap 20 (H) 5 - 15  CK  Result Value Ref Range   Total CK 14 (L) 49 - 397 U/L  Urinalysis, Routine w reflex microscopic Urine, Catheterized  Result Value Ref Range   Color, Urine YELLOW YELLOW   APPearance CLOUDY (A) CLEAR   Specific Gravity, Urine 1.018 1.005 - 1.030   pH 5.0 5.0 - 8.0   Glucose, UA >=500 (A) NEGATIVE mg/dL   Hgb urine dipstick SMALL (A) NEGATIVE   Bilirubin Urine NEGATIVE  NEGATIVE   Ketones, ur 5 (A) NEGATIVE mg/dL   Protein, ur 30 (A) NEGATIVE mg/dL   Nitrite NEGATIVE NEGATIVE   Leukocytes,Ua MODERATE (A) NEGATIVE   RBC / HPF >50 (H) 0 - 5 RBC/hpf   WBC, UA >50 (H) 0 - 5 WBC/hpf   Bacteria, UA RARE (A) NONE SEEN   Squamous Epithelial / LPF 0-5 0 - 5   WBC Clumps PRESENT    Mucus PRESENT    Budding Yeast PRESENT    Hyaline Casts, UA PRESENT    Ca Oxalate Crys, UA PRESENT   Lactic acid, plasma  Result  Value Ref Range   Lactic Acid, Venous 3.7 (HH) 0.5 - 1.9 mmol/L  Beta-hydroxybutyric acid  Result Value Ref Range   Beta-Hydroxybutyric Acid 3.20 (H) 0.05 - 0.27 mmol/L  Lactic acid, plasma  Result Value Ref Range   Lactic Acid, Venous 3.8 (HH) 0.5 - 1.9 mmol/L  CBG monitoring, ED  Result Value Ref Range   Glucose-Capillary >600 (HH) 70 - 99 mg/dL  CBG monitoring, ED  Result Value Ref Range   Glucose-Capillary >600 (HH) 70 - 99 mg/dL  CBG monitoring, ED  Result Value Ref Range   Glucose-Capillary >600 (HH) 70 - 99 mg/dL  CBG monitoring, ED  Result Value Ref Range   Glucose-Capillary 545 (HH) 70 - 99 mg/dL   Comment 1 Notify RN    Comment 2 Document in Chart   CBG monitoring, ED  Result Value Ref Range   Glucose-Capillary 506 (HH) 70 - 99 mg/dL   Comment 1 Notify RN   CBG monitoring, ED  Result Value Ref Range   Glucose-Capillary 432 (H) 70 - 99 mg/dL    EKG EKG Interpretation  Date/Time:  Saturday Nov 17, 2020 16:13:35 EDT Ventricular Rate:  117 PR Interval:  120 QRS Duration: 94 QT Interval:  325 QTC Calculation: 454 R Axis:   64 Text Interpretation: Sinus tachycardia Non-specific ST-t changes Baseline wander Confirmed by Cathren Laine ([id number]) on 2020/11/17 4:17:22 PM   Radiology DG Forearm Left  Result Date: 11/17/2020 CLINICAL DATA:  Infected wound to the distal left forearm. EXAM: LEFT FOREARM - 2 VIEW COMPARISON:  09/05/2020 FINDINGS: Intravenous catheter in the antecubital fossa region. Decreased soft tissue swelling since previous study. No soft tissue gas. No evidence of acute fracture or dislocation. No focal bone lesion or bone destruction. Degenerative changes in the wrist. IMPRESSION: Decreased soft tissue swelling since previous study. No acute bony abnormalities. Electronically Signed   By: Burman Nieves M.D.   On: 2020/11/17 20:09   CT FEMUR RIGHT WO CONTRAST  Result Date: 11-17-2020 CLINICAL DATA:  Pain and swelling in right thigh. Soft tissue  infection suspected. EXAM: CT OF THE LOWER RIGHT EXTREMITY WITHOUT CONTRAST TECHNIQUE: Multidetector CT imaging of the right lower extremity was performed according to the standard protocol. COMPARISON:  None. FINDINGS: Foley catheter present in the bladder. Large stool burden in the visualized rectum raising the possibility of fecal impaction. Recommend clinical correlation. Mild prostate enlargement with calcifications. Large soft tissue defect noted in the posteromedial upper right thigh. The defect extends down to the muscles. Gas and fluid noted within the soft tissues along the surface of the hamstring muscles extending from the soft tissue defect in the proximal thigh distally to just above the knee concerning for abscess. It is difficult to determine if this is within the hamstring muscles or along the surface of the muscle. Largest collection of gas and fluid seen on axial image 187  of series 7 measures approximately 3.3 x 2.1 cm. There are some locules of gas which extend deep into the musculature (image 166, 154, 188) compatible with intramuscular abscesses. Extensive vascular calcifications. No acute bony abnormality. No fracture. No bone destruction to suggest osteomyelitis. IMPRESSION: Elongated gas and fluid collection noted along the surface of the hamstring muscles, in some areas extending deep into the hamstring muscles compatible with intramuscular abscess. This extends over an extensive area in length extending from the soft tissue defect in the proximal posteromedial right thigh distally to just above the knee. No evidence of osteomyelitis. Electronically Signed   By: Charlett NoseKevin  Dover M.D.   On: 11/17/2020 20:25   DG Chest Port 1 View  Result Date: 11/19/2020 CLINICAL DATA:  60 year old male with weakness. EXAM: PORTABLE CHEST 1 VIEW COMPARISON:  Chest radiograph dated 08/18/2019 FINDINGS: Background of chronic interstitial coarsening and bronchitic changes. No focal consolidation, pleural  effusion, or pneumothorax. The cardiac silhouette is within limits. No acute osseous pathology. IMPRESSION: No acute cardiopulmonary process. Electronically Signed   By: Elgie CollardArash  Radparvar M.D.   On: 10/28/2020 17:09    Procedures .Marland Kitchen.Incision and Drainage  Date/Time: 11/19/2020 5:12 PM Performed by: Cathren LaineSteinl, Tyge Somers, MD Authorized by: Cathren LaineSteinl, Yumna Ebers, MD   Consent:    Consent given by:  Patient Location:    Type:  Abscess   Size:  5 cm diameter area of circular open wound with small amount necrotic tissue protruding from wound - debrided, fluctuant area opened   Location:  Lower extremity   Lower extremity location:  Leg (proximal right upper leg posteriomedially)   Leg location:  R upper leg Pre-procedure details:    Skin preparation:  Povidone-iodine Anesthesia:    Anesthesia method:  Local infiltration   Local anesthetic:  Lidocaine 2% WITH epi Procedure type:    Complexity:  Complex Procedure details:    Incision types:  Elliptical   Incision depth:  Subcutaneous   Wound management:  Probed and deloculated, irrigated with saline and debrided   Drainage:  Purulent   Drainage amount:  Moderate   Packing material: moist to dry 4 by 4 dressing.  Post-procedure details:    Procedure completion:  Tolerated well, no immediate complications .Marland Kitchen.Incision and Drainage  Date/Time: 11/15/2020 5:14 PM Performed by: Cathren LaineSteinl, Arnelle Nale, MD Authorized by: Cathren LaineSteinl, Dhamar Gregory, MD   Consent:    Consent given by:  Patient Location:    Type:  Abscess   Size:  3 cm   Location:  Upper extremity   Upper extremity location:  Wrist   Wrist location:  L wrist Pre-procedure details:    Skin preparation:  Povidone-iodine Anesthesia:    Anesthesia method:  Local infiltration   Local anesthetic:  Lidocaine 2% WITH epi Procedure type:    Complexity:  Complex Procedure details:    Incision types:  Elliptical   Incision depth:  Subcutaneous   Wound management:  Probed and deloculated and irrigated with saline    Drainage:  Purulent   Drainage amount:  Moderate   Wound treatment:  Wound left open Post-procedure details:    Procedure completion:  Tolerated well, no immediate complications     Medications Ordered in ED Medications  sodium chloride 0.9 % bolus 1,000 mL (has no administration in time range)    ED Course  I have reviewed the triage vital signs and the nursing notes.  Pertinent labs & imaging results that were available during my care of the patient were reviewed by me and considered in my  medical decision making (see chart for details).    MDM Rules/Calculators/A&P                         Iv ns bolus. Stat labs. Cultures. Continuous pulse ox and cardiac monitoring. Pcxr.   Reviewed nursing notes and prior charts for additional history.   Labs reviewed/interpreted by me - wbc elevated. Post cultures, iv antibiotics given.   CXR reviewed/interpreted by me - no pna.   I and D wounds. Sterile dressings applied. Tetanus im.   Additional lr bolus. Pt receiving well more than 30 cc/kg ns and lr boluses.   With fluids, pt reports feeling improved.   Given soft bps, and persistently elevated lactate, ICU team at [facility] consulted for possible admission - discussed pt, labs, imaging studies, etc. - pccm on call indicates feels pt stable for admission to icu at [facility], requests hospitalist admission here.   Will consult hospitalist.   MDM Number of Diagnoses or Management Options   Amount and/or Complexity of Data Reviewed Clinical lab tests: ordered and reviewed Tests in the radiology section of CPT: ordered and reviewed Tests in the medicine section of CPT: ordered and reviewed Discussion of test results with the performing providers: yes Decide to obtain previous medical records or to obtain history from someone other than the patient: yes Obtain history from someone other than the patient: yes Review and summarize past medical records: yes Discuss the patient with other  providers: yes Independent visualization of images, tracings, or specimens: yes  Risk of Complications, Morbidity, and/or Mortality Presenting problems: high Diagnostic procedures: high Management options: high   CRITICAL CARE RE: sepsis, AKI, RLE and LUE infection, severe dehydration.  Performed by: Suzi Roots Total critical care time: 165 minutes Critical care time was exclusive of separately billable procedures and treating other patients. Critical care was necessary to treat or prevent imminent or life-threatening deterioration. Critical care was time spent personally by me on the following activities: development of treatment plan with patient and/or surrogate as well as nursing, discussions with consultants, evaluation of patient's response to treatment, examination of patient, obtaining history from patient or surrogate, ordering and performing treatments and interventions, ordering and review of laboratory studies, ordering and review of radiographic studies, pulse oximetry and re-evaluation of patient's condition.   Final Clinical Impression(s) / ED Diagnoses Final diagnoses:  None    Rx / DC Orders ED Discharge Orders    None       Cathren Laine, MD 11/08/2020 2106

## 2020-10-22 NOTE — Progress Notes (Signed)
Pharmacy Antibiotic Note  Ronald Brady is a 60 y.o. male admitted on 11/09/2020 with cellulitis.  Pharmacy has been consulted for meropenem dosing. Meropenem 1gm given earlier  Plan: Meropenem 2gm IV q12 hours F/u clinical course, cultures and renal fuction  Height: 5\' 5"  (165.1 cm) Weight: 68 kg (150 lb) IBW/kg (Calculated) : 61.5  Temp (24hrs), Avg:96.3 F (35.7 C), Min:95.7 F (35.4 C), Max:96.9 F (36.1 C)  Recent Labs  Lab 11/06/2020 1612 11/03/2020 1708 10/29/2020 1900 10/27/2020 2157  WBC 13.4*  --   --   --   CREATININE 1.92*  --   --   --   LATICACIDVEN  --  3.7* 3.8* 7.2*    Estimated Creatinine Clearance: 36.5 mL/min (A) (by C-G formula based on SCr of 1.92 mg/dL (H)).    Allergies  Allergen Reactions  . Penicillins Other (See Comments)    Pt is unsure of reaction. States PCP told him he was allergic. Did it involve swelling of the face/tongue/throat, SOB, or low BP? Unknown Did it involve sudden or severe rash/hives, skin peeling, or any reaction on the inside of your mouth or nose? Unknown Did you need to seek medical attention at a hospital or doctor's office? Unknown When did it last happen?unk If all above answers are "NO", may proceed with cephalosporin use.      Thank you for allowing pharmacy to be a part of this patient's care.  [id number] 11/10/2020 11:23 PM

## 2020-10-22 NOTE — ED Notes (Signed)
Dr Denton Lank at bedside for [facility] prior to triage complete.

## 2020-10-22 NOTE — ED Notes (Signed)
Patient transported to CT 

## 2020-10-22 NOTE — ED Notes (Signed)
O2 Sats remain at 95 without oxygen. Nasal canula removed

## 2020-10-22 NOTE — ED Notes (Signed)
Pt rectal temp 95.7, pt placed on bear hugger at this time.

## 2020-10-22 NOTE — ED Notes (Signed)
Pt covered in feces/ urine. Pt given a bed bath and placed on clean bed linens. Abccess noted to pts right upper thigh area. MD aware of site.

## 2020-10-22 NOTE — ED Notes (Signed)
Date and time results received: 11/08/2020 10:54   Test: Lactic acid ritical Value: 7.2  Name of Provider Notified: Emokpae  Orders Received? Or Actions Taken?: NA

## 2020-10-23 ENCOUNTER — Inpatient Hospital Stay (HOSPITAL_COMMUNITY): Payer: Medicaid Other

## 2020-10-23 ENCOUNTER — Encounter (HOSPITAL_COMMUNITY): Admission: EM | Disposition: E | Payer: Self-pay | Source: Home / Self Care | Attending: Pulmonary Disease

## 2020-10-23 ENCOUNTER — Inpatient Hospital Stay (HOSPITAL_COMMUNITY): Payer: Medicaid Other | Admitting: Anesthesiology

## 2020-10-23 DIAGNOSIS — L02414 Cutaneous abscess of left upper limb: Secondary | ICD-10-CM

## 2020-10-23 DIAGNOSIS — R7881 Bacteremia: Secondary | ICD-10-CM

## 2020-10-23 DIAGNOSIS — R6520 Severe sepsis without septic shock: Secondary | ICD-10-CM

## 2020-10-23 DIAGNOSIS — E1069 Type 1 diabetes mellitus with other specified complication: Secondary | ICD-10-CM

## 2020-10-23 DIAGNOSIS — A419 Sepsis, unspecified organism: Secondary | ICD-10-CM

## 2020-10-23 DIAGNOSIS — Z880 Allergy status to penicillin: Secondary | ICD-10-CM

## 2020-10-23 DIAGNOSIS — L02415 Cutaneous abscess of right lower limb: Secondary | ICD-10-CM

## 2020-10-23 DIAGNOSIS — B9561 Methicillin susceptible Staphylococcus aureus infection as the cause of diseases classified elsewhere: Secondary | ICD-10-CM | POA: Diagnosis not present

## 2020-10-23 HISTORY — PX: INCISION AND DRAINAGE: SHX5863

## 2020-10-23 LAB — CBC
HCT: 25.2 % — ABNORMAL LOW (ref 39.0–52.0)
Hemoglobin: 7.8 g/dL — ABNORMAL LOW (ref 13.0–17.0)
MCH: 27.9 pg (ref 26.0–34.0)
MCHC: 31 g/dL (ref 30.0–36.0)
MCV: 90 fL (ref 80.0–100.0)
Platelets: 288 10*3/uL (ref 150–400)
RBC: 2.8 MIL/uL — ABNORMAL LOW (ref 4.22–5.81)
RDW: 15.1 % (ref 11.5–15.5)
WBC: 10.3 10*3/uL (ref 4.0–10.5)
nRBC: 0 % (ref 0.0–0.2)

## 2020-10-23 LAB — BLOOD CULTURE ID PANEL (REFLEXED) - BCID2

## 2020-10-23 LAB — BASIC METABOLIC PANEL
Anion gap: 9 (ref 5–15)
Anion gap: 7 (ref 5–15)
Anion gap: 7 (ref 5–15)
BUN: 22 mg/dL — ABNORMAL HIGH (ref 6–20)
BUN: 21 mg/dL — ABNORMAL HIGH (ref 6–20)
BUN: 21 mg/dL — ABNORMAL HIGH (ref 6–20)
CO2: 28 mmol/L (ref 22–32)
CO2: 24 mmol/L (ref 22–32)
CO2: 24 mmol/L (ref 22–32)
Calcium: 7.5 mg/dL — ABNORMAL LOW (ref 8.9–10.3)
Calcium: 7.6 mg/dL — ABNORMAL LOW (ref 8.9–10.3)
Calcium: 7.5 mg/dL — ABNORMAL LOW (ref 8.9–10.3)
Chloride: 101 mmol/L (ref 98–111)
Chloride: 102 mmol/L (ref 98–111)
Chloride: 103 mmol/L (ref 98–111)
Creatinine, Ser: 0.83 mg/dL (ref 0.61–1.24)
Creatinine, Ser: 0.72 mg/dL (ref 0.61–1.24)
Creatinine, Ser: 0.69 mg/dL (ref 0.61–1.24)
GFR, Estimated: >60 mL/min (ref >60)
GFR, Estimated: >60 mL/min (ref >60)
GFR, Estimated: >60 mL/min (ref >60)
Glucose, Bld: 141 mg/dL — ABNORMAL HIGH (ref 70–99)
Glucose, Bld: 222 mg/dL — ABNORMAL HIGH (ref 70–99)
Glucose, Bld: 264 mg/dL — ABNORMAL HIGH (ref 70–99)
Potassium: 2.7 mmol/L — CL (ref 3.5–5.1)
Potassium: 3 mmol/L — ABNORMAL LOW (ref 3.5–5.1)
Potassium: 2.9 mmol/L — ABNORMAL LOW (ref 3.5–5.1)
Sodium: 138 mmol/L (ref 135–145)
Sodium: 133 mmol/L — ABNORMAL LOW (ref 135–145)
Sodium: 134 mmol/L — ABNORMAL LOW (ref 135–145)

## 2020-10-23 LAB — GLUCOSE, CAPILLARY
Glucose-Capillary: 255 mg/dL — ABNORMAL HIGH (ref 70–99)
Glucose-Capillary: 205 mg/dL — ABNORMAL HIGH (ref 70–99)
Glucose-Capillary: 242 mg/dL — ABNORMAL HIGH (ref 70–99)

## 2020-10-23 LAB — CBG MONITORING, ED
Glucose-Capillary: 147 mg/dL — ABNORMAL HIGH (ref 70–99)
Glucose-Capillary: 150 mg/dL — ABNORMAL HIGH (ref 70–99)
Glucose-Capillary: 153 mg/dL — ABNORMAL HIGH (ref 70–99)
Glucose-Capillary: 177 mg/dL — ABNORMAL HIGH (ref 70–99)
Glucose-Capillary: 184 mg/dL — ABNORMAL HIGH (ref 70–99)
Glucose-Capillary: 249 mg/dL — ABNORMAL HIGH (ref 70–99)

## 2020-10-23 LAB — LACTIC ACID, PLASMA: Lactic Acid, Venous: 2.6 mmol/L (ref 0.5–1.9)

## 2020-10-23 LAB — HIV ANTIBODY (ROUTINE TESTING W REFLEX): HIV Screen 4th Generation wRfx: NONREACTIVE

## 2020-10-23 LAB — MRSA PCR SCREENING: MRSA by PCR: NEGATIVE

## 2020-10-23 LAB — CK: Total CK: 9 U/L — ABNORMAL LOW (ref 49–397)

## 2020-10-23 LAB — BETA-HYDROXYBUTYRIC ACID: Beta-Hydroxybutyric Acid: 0.06 mmol/L (ref 0.05–0.27)

## 2020-10-23 SURGERY — INCISION AND DRAINAGE
Anesthesia: General | Laterality: Left

## 2020-10-23 MED ORDER — ONDANSETRON HCL 4 MG PO TABS: 4.0000 mg | ORAL_TABLET | Freq: Four times a day (QID) | ORAL | Status: DC | PRN

## 2020-10-23 MED ORDER — IPRATROPIUM-ALBUTEROL 0.5-2.5 (3) MG/3ML IN SOLN: 3.0000 mL | RESPIRATORY_TRACT | Status: DC

## 2020-10-23 MED ORDER — INSULIN ASPART 100 UNIT/ML ~~LOC~~ SOLN
4.0000 [IU] | Freq: Three times a day (TID) | SUBCUTANEOUS | Status: DC
  Administered 2020-10-23 – 2020-10-28 (×7): 4 [IU] via SUBCUTANEOUS
  Filled 2020-10-23: qty 1

## 2020-10-23 MED ORDER — POTASSIUM CHLORIDE 10 MEQ/100ML IV SOLN
10.0000 meq | INTRAVENOUS | Status: AC
  Administered 2020-10-23 (×3): 10 meq via INTRAVENOUS
  Filled 2020-10-23 (×3): qty 100

## 2020-10-23 MED ORDER — ALBUMIN HUMAN 5 % IV SOLN: INTRAVENOUS | Status: DC | PRN

## 2020-10-23 MED ORDER — IPRATROPIUM-ALBUTEROL 0.5-2.5 (3) MG/3ML IN SOLN: 3.0000 mL | Freq: Once | RESPIRATORY_TRACT | Status: DC

## 2020-10-23 MED ORDER — ROCURONIUM BROMIDE 10 MG/ML (PF) SYRINGE
PREFILLED_SYRINGE | INTRAVENOUS | Status: DC | PRN
  Administered 2020-10-23: 30 mg via INTRAVENOUS

## 2020-10-23 MED ORDER — NOREPINEPHRINE 4 MG/250ML-% IV SOLN
0.0000 ug/min | INTRAVENOUS | Status: DC
  Administered 2020-10-23 – 2020-10-25 (×3): 2 ug/min via INTRAVENOUS
  Administered 2020-10-26: 6 ug/min via INTRAVENOUS
  Administered 2020-10-27 – 2020-10-28 (×2): 2 ug/min via INTRAVENOUS
  Filled 2020-10-23 (×7): qty 250

## 2020-10-23 MED ORDER — CEFAZOLIN SODIUM-DEXTROSE 2-4 GM/100ML-% IV SOLN
2.0000 g | Freq: Three times a day (TID) | INTRAVENOUS | Status: DC
  Administered 2020-10-23 – 2020-11-01 (×28): 2 g via INTRAVENOUS
  Filled 2020-10-23 (×34): qty 100

## 2020-10-23 MED ORDER — PHENYLEPHRINE HCL (PRESSORS) 10 MG/ML IV SOLN
INTRAVENOUS | Status: AC
  Filled 2020-10-23: qty 1

## 2020-10-23 MED ORDER — ETOMIDATE 2 MG/ML IV SOLN
INTRAVENOUS | Status: AC
  Filled 2020-10-23: qty 10

## 2020-10-23 MED ORDER — EPHEDRINE SULFATE-NACL 50-0.9 MG/10ML-% IV SOSY
PREFILLED_SYRINGE | INTRAVENOUS | Status: DC | PRN
  Administered 2020-10-23: 10 mg via INTRAVENOUS

## 2020-10-23 MED ORDER — INSULIN ASPART 100 UNIT/ML ~~LOC~~ SOLN
0.0000 [IU] | Freq: Three times a day (TID) | SUBCUTANEOUS | Status: DC
  Administered 2020-10-23: 3 [IU] via SUBCUTANEOUS
  Administered 2020-10-23: 5 [IU] via SUBCUTANEOUS
  Administered 2020-10-24 (×3): 3 [IU] via SUBCUTANEOUS
  Administered 2020-10-25 (×2): 2 [IU] via SUBCUTANEOUS
  Administered 2020-10-25: 3 [IU] via SUBCUTANEOUS
  Administered 2020-10-26: 5 [IU] via SUBCUTANEOUS
  Administered 2020-10-26: 2 [IU] via SUBCUTANEOUS
  Administered 2020-10-26: 5 [IU] via SUBCUTANEOUS
  Administered 2020-10-27: 1 [IU] via SUBCUTANEOUS
  Administered 2020-10-27: 2 [IU] via SUBCUTANEOUS
  Administered 2020-10-28: 5 [IU] via SUBCUTANEOUS
  Administered 2020-10-28: 7 [IU] via SUBCUTANEOUS
  Administered 2020-10-28: 2 [IU] via SUBCUTANEOUS
  Administered 2020-10-29: 1 [IU] via SUBCUTANEOUS
  Administered 2020-10-29 – 2020-10-31 (×5): 2 [IU] via SUBCUTANEOUS
  Administered 2020-10-31: 5 [IU] via SUBCUTANEOUS
  Filled 2020-10-23: qty 1

## 2020-10-23 MED ORDER — ALBUTEROL (5 MG/ML) CONTINUOUS INHALATION SOLN
10.0000 mg/h | INHALATION_SOLUTION | Freq: Once | RESPIRATORY_TRACT | Status: AC
  Administered 2020-10-23: 10 mg/h via RESPIRATORY_TRACT
  Filled 2020-10-23: qty 20

## 2020-10-23 MED ORDER — ROCURONIUM BROMIDE 10 MG/ML (PF) SYRINGE
PREFILLED_SYRINGE | INTRAVENOUS | Status: AC
  Filled 2020-10-23: qty 10

## 2020-10-23 MED ORDER — SUGAMMADEX SODIUM 200 MG/2ML IV SOLN
INTRAVENOUS | Status: DC | PRN
  Administered 2020-10-23: 150 mg via INTRAVENOUS

## 2020-10-23 MED ORDER — SODIUM CHLORIDE 0.9% FLUSH: 10.0000 mL | INTRAVENOUS | Status: DC | PRN

## 2020-10-23 MED ORDER — PROPOFOL 10 MG/ML IV BOLUS
INTRAVENOUS | Status: AC
  Filled 2020-10-23: qty 20

## 2020-10-23 MED ORDER — SODIUM CHLORIDE 0.9 % IV SOLN
1.0000 g | Freq: Three times a day (TID) | INTRAVENOUS | Status: DC
  Administered 2020-10-23: 1 g via INTRAVENOUS
  Filled 2020-10-23 (×2): qty 1

## 2020-10-23 MED ORDER — SODIUM CHLORIDE 0.9% FLUSH
10.0000 mL | Freq: Two times a day (BID) | INTRAVENOUS | Status: DC
  Administered 2020-10-23 – 2020-10-30 (×13): 10 mL
  Administered 2020-10-31: 20 mL

## 2020-10-23 MED ORDER — DOCUSATE SODIUM 100 MG PO CAPS
100.0000 mg | ORAL_CAPSULE | Freq: Two times a day (BID) | ORAL | Status: DC
  Administered 2020-10-24 (×2): 100 mg via ORAL
  Filled 2020-10-23 (×3): qty 1

## 2020-10-23 MED ORDER — FENTANYL CITRATE (PF) 100 MCG/2ML IJ SOLN: 25.0000 ug | INTRAMUSCULAR | Status: DC | PRN

## 2020-10-23 MED ORDER — PROPOFOL 10 MG/ML IV BOLUS
INTRAVENOUS | Status: DC | PRN
  Administered 2020-10-23: 130 mg via INTRAVENOUS

## 2020-10-23 MED ORDER — DEXAMETHASONE SODIUM PHOSPHATE 10 MG/ML IJ SOLN
INTRAMUSCULAR | Status: AC
  Filled 2020-10-23: qty 1

## 2020-10-23 MED ORDER — LIDOCAINE 2% (20 MG/ML) 5 ML SYRINGE
INTRAMUSCULAR | Status: AC
  Filled 2020-10-23: qty 5

## 2020-10-23 MED ORDER — FENTANYL CITRATE (PF) 100 MCG/2ML IJ SOLN: 25.0000 ug | INTRAMUSCULAR | Status: DC

## 2020-10-23 MED ORDER — MIDAZOLAM HCL 2 MG/2ML IJ SOLN
INTRAMUSCULAR | Status: DC | PRN
  Administered 2020-10-23 (×2): 1 mg via INTRAVENOUS

## 2020-10-23 MED ORDER — PHENYLEPHRINE 40 MCG/ML (10ML) SYRINGE FOR IV PUSH (FOR BLOOD PRESSURE SUPPORT)
PREFILLED_SYRINGE | INTRAVENOUS | Status: DC | PRN
  Administered 2020-10-23 (×4): 80 ug via INTRAVENOUS

## 2020-10-23 MED ORDER — MIDAZOLAM HCL 2 MG/2ML IJ SOLN
INTRAMUSCULAR | Status: AC
  Filled 2020-10-23: qty 2

## 2020-10-23 MED ORDER — CHLORHEXIDINE GLUCONATE CLOTH 2 % EX PADS
6.0000 | MEDICATED_PAD | Freq: Every day | CUTANEOUS | Status: DC
  Administered 2020-10-23 – 2020-11-04 (×13): 6 via TOPICAL

## 2020-10-23 MED ORDER — ALBUMIN HUMAN 5 % IV SOLN
INTRAVENOUS | Status: AC
  Filled 2020-10-23: qty 250

## 2020-10-23 MED ORDER — SUCCINYLCHOLINE CHLORIDE 200 MG/10ML IV SOSY
PREFILLED_SYRINGE | INTRAVENOUS | Status: AC
  Filled 2020-10-23: qty 10

## 2020-10-23 MED ORDER — SODIUM CHLORIDE 0.9 % IR SOLN
Status: DC | PRN
  Administered 2020-10-23: 3000 mL

## 2020-10-23 MED ORDER — LIDOCAINE HCL (CARDIAC) PF 100 MG/5ML IV SOSY
PREFILLED_SYRINGE | INTRAVENOUS | Status: DC | PRN
  Administered 2020-10-23: 60 mg via INTRAVENOUS

## 2020-10-23 MED ORDER — METOCLOPRAMIDE HCL 5 MG/ML IJ SOLN: 5.0000 mg | Freq: Three times a day (TID) | INTRAMUSCULAR | Status: DC | PRN

## 2020-10-23 MED ORDER — PHENYLEPHRINE 40 MCG/ML (10ML) SYRINGE FOR IV PUSH (FOR BLOOD PRESSURE SUPPORT)
PREFILLED_SYRINGE | INTRAVENOUS | Status: AC
  Filled 2020-10-23: qty 10

## 2020-10-23 MED ORDER — ALBUTEROL SULFATE HFA 108 (90 BASE) MCG/ACT IN AERS: 1.0000 | INHALATION_SPRAY | Freq: Four times a day (QID) | RESPIRATORY_TRACT | Status: DC | PRN

## 2020-10-23 MED ORDER — INSULIN ASPART 100 UNIT/ML ~~LOC~~ SOLN
0.0000 [IU] | Freq: Every day | SUBCUTANEOUS | Status: DC
  Administered 2020-10-23 – 2020-10-24 (×2): 2 [IU] via SUBCUTANEOUS

## 2020-10-23 MED ORDER — FENTANYL CITRATE (PF) 250 MCG/5ML IJ SOLN
INTRAMUSCULAR | Status: DC | PRN
  Administered 2020-10-23 (×5): 50 ug via INTRAVENOUS

## 2020-10-23 MED ORDER — MOMETASONE FURO-FORMOTEROL FUM 200-5 MCG/ACT IN AERO
2.0000 | INHALATION_SPRAY | Freq: Two times a day (BID) | RESPIRATORY_TRACT | Status: DC
  Administered 2020-10-24 – 2020-11-04 (×16): 2 via RESPIRATORY_TRACT
  Filled 2020-10-23: qty 8.8

## 2020-10-23 MED ORDER — EPHEDRINE 5 MG/ML INJ
INTRAVENOUS | Status: AC
  Filled 2020-10-23: qty 10

## 2020-10-23 MED ORDER — PHENYLEPHRINE HCL-NACL 10-0.9 MG/250ML-% IV SOLN
INTRAVENOUS | Status: DC | PRN
  Administered 2020-10-23: 50 ug/min via INTRAVENOUS

## 2020-10-23 MED ORDER — CHLORHEXIDINE GLUCONATE 0.12 % MT SOLN
15.0000 mL | Freq: Two times a day (BID) | OROMUCOSAL | Status: DC
  Administered 2020-10-23 – 2020-11-01 (×16): 15 mL via OROMUCOSAL
  Filled 2020-10-23 (×14): qty 15

## 2020-10-23 MED ORDER — NICOTINE 21 MG/24HR TD PT24
21.0000 mg | MEDICATED_PATCH | Freq: Every day | TRANSDERMAL | Status: DC
  Administered 2020-10-23 – 2020-11-04 (×13): 21 mg via TRANSDERMAL
  Filled 2020-10-23 (×14): qty 1

## 2020-10-23 MED ORDER — MORPHINE SULFATE (PF) 2 MG/ML IV SOLN
2.0000 mg | INTRAVENOUS | Status: DC | PRN
  Administered 2020-10-24 – 2020-11-01 (×12): 2 mg via INTRAVENOUS
  Filled 2020-10-23 (×13): qty 1

## 2020-10-23 MED ORDER — ORAL CARE MOUTH RINSE
15.0000 mL | Freq: Two times a day (BID) | OROMUCOSAL | Status: DC
  Administered 2020-10-23 – 2020-11-01 (×12): 15 mL via OROMUCOSAL

## 2020-10-23 MED ORDER — FENTANYL CITRATE (PF) 100 MCG/2ML IJ SOLN
25.0000 ug | INTRAMUSCULAR | Status: DC | PRN
  Administered 2020-10-23 – 2020-10-24 (×3): 50 ug via INTRAVENOUS
  Administered 2020-10-24: 25 ug via INTRAVENOUS
  Administered 2020-10-25 – 2020-10-27 (×8): 50 ug via INTRAVENOUS
  Filled 2020-10-23 (×12): qty 2

## 2020-10-23 MED ORDER — SUCCINYLCHOLINE CHLORIDE 200 MG/10ML IV SOSY
PREFILLED_SYRINGE | INTRAVENOUS | Status: DC | PRN
  Administered 2020-10-23: 100 mg via INTRAVENOUS

## 2020-10-23 MED ORDER — SODIUM CHLORIDE 0.9 % IV SOLN: Freq: Once | INTRAVENOUS | Status: AC

## 2020-10-23 MED ORDER — ONDANSETRON HCL 4 MG/2ML IJ SOLN: 4.0000 mg | Freq: Once | INTRAMUSCULAR | Status: DC | PRN

## 2020-10-23 MED ORDER — FENTANYL CITRATE (PF) 250 MCG/5ML IJ SOLN
INTRAMUSCULAR | Status: AC
  Filled 2020-10-23: qty 5

## 2020-10-23 MED ORDER — ONDANSETRON HCL 4 MG/2ML IJ SOLN
INTRAMUSCULAR | Status: AC
  Filled 2020-10-23: qty 2

## 2020-10-23 MED ORDER — INSULIN GLARGINE 100 UNIT/ML ~~LOC~~ SOLN
15.0000 [IU] | Freq: Every day | SUBCUTANEOUS | Status: DC
  Administered 2020-10-23 – 2020-10-24 (×3): 15 [IU] via SUBCUTANEOUS
  Filled 2020-10-23 (×4): qty 0.15

## 2020-10-23 MED ORDER — SODIUM CHLORIDE 0.9 % IV SOLN
INTRAVENOUS | Status: AC
  Filled 2020-10-23 (×2): qty 1

## 2020-10-23 MED ORDER — ONDANSETRON HCL 4 MG/2ML IJ SOLN: 4.0000 mg | Freq: Four times a day (QID) | INTRAMUSCULAR | Status: DC | PRN

## 2020-10-23 MED ORDER — ALBUTEROL SULFATE (2.5 MG/3ML) 0.083% IN NEBU: 2.5000 mg | INHALATION_SOLUTION | Freq: Once | RESPIRATORY_TRACT | Status: DC

## 2020-10-23 MED ORDER — METFORMIN HCL 500 MG PO TABS
1000.0000 mg | ORAL_TABLET | Freq: Two times a day (BID) | ORAL | Status: DC
  Administered 2020-10-24 – 2020-10-31 (×7): 1000 mg via ORAL
  Filled 2020-10-23 (×15): qty 2

## 2020-10-23 MED ORDER — IPRATROPIUM-ALBUTEROL 0.5-2.5 (3) MG/3ML IN SOLN
3.0000 mL | Freq: Four times a day (QID) | RESPIRATORY_TRACT | Status: DC
  Administered 2020-10-23 – 2020-10-29 (×22): 3 mL via RESPIRATORY_TRACT
  Filled 2020-10-23 (×23): qty 3

## 2020-10-23 MED ORDER — LACTATED RINGERS IV SOLN: INTRAVENOUS | Status: DC | PRN

## 2020-10-23 MED ORDER — METOCLOPRAMIDE HCL 5 MG PO TABS: 5.0000 mg | ORAL_TABLET | Freq: Three times a day (TID) | ORAL | Status: DC | PRN

## 2020-10-23 MED ORDER — ONDANSETRON HCL 4 MG/2ML IJ SOLN
INTRAMUSCULAR | Status: DC | PRN
  Administered 2020-10-23: 4 mg via INTRAVENOUS

## 2020-10-23 MED ORDER — OXYCODONE-ACETAMINOPHEN 5-325 MG PO TABS
2.0000 | ORAL_TABLET | ORAL | Status: DC | PRN
  Administered 2020-10-27 – 2020-10-31 (×7): 2 via ORAL
  Filled 2020-10-23 (×7): qty 2

## 2020-10-23 SURGICAL SUPPLY — 47 items
APL PRP STRL LF DISP 70% ISPRP (MISCELLANEOUS) ×1
APL SKNCLS STERI-STRIP NONHPOA (GAUZE/BANDAGES/DRESSINGS) ×1
BANDAGE ESMARK 6X9 LF (GAUZE/BANDAGES/DRESSINGS) IMPLANT
BENZOIN TINCTURE PRP APPL 2/3 (GAUZE/BANDAGES/DRESSINGS) ×1 IMPLANT
BNDG CMPR 9X6 STRL LF SNTH (GAUZE/BANDAGES/DRESSINGS) ×1
BNDG CMPR MED 15X6 ELC VLCR LF (GAUZE/BANDAGES/DRESSINGS) ×1
BNDG ELASTIC 4X5.8 VLCR STR LF (GAUZE/BANDAGES/DRESSINGS) ×2 IMPLANT
BNDG ELASTIC 6X15 VLCR STRL LF (GAUZE/BANDAGES/DRESSINGS) ×1 IMPLANT
BNDG ESMARK 6X9 LF (GAUZE/BANDAGES/DRESSINGS) ×2
BNDG GAUZE ELAST 4 BULKY (GAUZE/BANDAGES/DRESSINGS) IMPLANT
CHLORAPREP W/TINT 26 (MISCELLANEOUS) ×2 IMPLANT
COVER SURGICAL LIGHT HANDLE (MISCELLANEOUS) ×2 IMPLANT
COVER WAND RF STERILE (DRAPES) IMPLANT
CUFF TOURN SGL QUICK 18X4 (TOURNIQUET CUFF) IMPLANT
CUFF TOURN SGL QUICK 24 (TOURNIQUET CUFF)
CUFF TOURN SGL QUICK 34 (TOURNIQUET CUFF)
CUFF TRNQT CYL 24X4X16.5-23 (TOURNIQUET CUFF) IMPLANT
CUFF TRNQT CYL 34X4.125X (TOURNIQUET CUFF) IMPLANT
DRAIN PENROSE 0.5X18 (DRAIN) IMPLANT
DRAPE U-SHAPE 47X51 STRL (DRAPES) ×2 IMPLANT
DRSG PAD ABDOMINAL 8X10 ST (GAUZE/BANDAGES/DRESSINGS) ×4 IMPLANT
DRSG VAC ATS SM SENSATRAC (GAUZE/BANDAGES/DRESSINGS) ×2 IMPLANT
ELECT REM PT RETURN 15FT ADLT (MISCELLANEOUS) ×2 IMPLANT
EVACUATOR 1/8 PVC DRAIN (DRAIN) IMPLANT
GAUZE SPONGE 4X4 12PLY STRL (GAUZE/BANDAGES/DRESSINGS) IMPLANT
GAUZE XEROFORM 5X9 LF (GAUZE/BANDAGES/DRESSINGS) IMPLANT
GLOVE ORTHO TXT STRL SZ7.5 (GLOVE) ×2 IMPLANT
GLOVE SRG 8 PF TXTR STRL LF DI (GLOVE) ×1 IMPLANT
GLOVE SURG UNDER POLY LF SZ8 (GLOVE) ×2
GOWN STRL REUS W/TWL LRG LVL3 (GOWN DISPOSABLE) ×2 IMPLANT
GOWN STRL REUS W/TWL XL LVL3 (GOWN DISPOSABLE) ×2 IMPLANT
HANDPIECE INTERPULSE COAX TIP (DISPOSABLE) ×2
KIT BASIN OR (CUSTOM PROCEDURE TRAY) ×2 IMPLANT
KIT TURNOVER KIT A (KITS) ×2 IMPLANT
PACK ORTHO EXTREMITY (CUSTOM PROCEDURE TRAY) ×2 IMPLANT
PAD CAST 4YDX4 CTTN HI CHSV (CAST SUPPLIES) IMPLANT
PADDING CAST COTTON 4X4 STRL (CAST SUPPLIES)
PENCIL SMOKE EVACUATOR (MISCELLANEOUS) IMPLANT
PROTECTOR NERVE ULNAR (MISCELLANEOUS) IMPLANT
SET HNDPC FAN SPRY TIP SCT (DISPOSABLE) IMPLANT
SPONGE LAP 18X18 RF (DISPOSABLE) IMPLANT
SUT ETHILON 2 0 PS N (SUTURE) IMPLANT
SWAB CULTURE ESWAB REG 1ML (MISCELLANEOUS) ×1 IMPLANT
SYR CONTROL 10ML LL (SYRINGE) IMPLANT
TOWEL OR 17X26 10 PK STRL BLUE (TOWEL DISPOSABLE) ×2 IMPLANT
TOWEL OR NON WOVEN STRL DISP B (DISPOSABLE) ×2 IMPLANT
TUBING CONNECTING 10 (TUBING) ×1 IMPLANT

## 2020-10-23 NOTE — Consult Note (Signed)
NAME:  Ronald Brady, MRN:  [id number], DOB:  03/08/1961, LOS: 1 ADMISSION DATE:  10/25/2020, CONSULTATION DATE:  4/3 REFERRING MD:  [facility], CHIEF COMPLAINT:  sepsis   History of Present Illness:  60 yo male smoker  diabetic found covered in urine/feces p unable to get up in chair with several skin abscesses > I & D in ER at [facility] but deteriorating bp   4/3 so rec transfer to [facility] service in [place].   Pertinent  Medical History  Chief Complaint: Abscess right thigh, left wrist   HPI: Ronald Brady is a 60 y.o. male with medical history significant for  DM, HTN, emphysema.  Patient was brought to the ED 4/2 pm  via EMS after he was found by patient's sister at home.  Patient had been sitting in a chair for 4 days unable to get up.  He was found covered in feces and urine.  Patient reports that he had torn hamstring which prevented him from getting up from a chair, this injury occurred about 3 weeks PTA Over the past 4 days he not eating anything, or taking any of his insulins.  Reported  temperature in the house was about 75 degrees.  He denied pain with urination.  Denied chest pain.    ED Course: Hypothermic - temperature 95.7, tachycardic to 121, hypotensive blood pressure dropping to 81/56 improved after 4 L bolus given to  systolic n 102/71.  Lactic acidosis of 3.7 > 3.8.  UA with moderate leukocytes, yeast.  Draining abscess noted to right thigh, and left wrist.  WBC 13.4.  Creatinine elevated 1.9.  Blood glucose 980.  Serum bicarb 27, anion gap of 20. CT right femur-shows elongated gas and fluid collection along surface of hamstring muscles extending deep into the hamstring muscle compatible with intramuscular abscess. Insulin drip started, clindamycin started.  I and D both done at bedside in the Ed by EDP.  4/3 am bp trending down and lactate up so cvp placed in ER and agreed to transfer to ICU service in [place].    Significant Hospital Events: Including procedures,  antibiotic start and stop dates in addition to other pertinent events   4/2 CT femur:Elongated gas and fluid collection noted along the surface of the hamstring muscles, in some areas extending deep into the hamstring muscles compatible with intramuscular abscess. This extends over an extensive area in length extending from the soft tissue defect in the proximal posteromedial right thigh distally to just above the Knee.  No evidence of osteomyelitis. 4/3 wrist CT Edema and locules of gas throughout the subcutaneous soft tissues in the anterior distal forearm. No well-defined focal abscess  . Clindamycin 4/2 >>> . Meropenem 4/2 >>> . BC x 2   >  2/2 positive for gpc's/ staph detected  . 4/3 CVL RIJ  >>> . Ortho consult (Dr Ophelia Charter)  4/3    Scheduled Meds: . albuterol  2.5 mg Nebulization Once  . heparin  5,000 Units Subcutaneous Q8H  . insulin aspart  0-5 Units Subcutaneous QHS  . insulin aspart  0-9 Units Subcutaneous TID WC  . insulin aspart  4 Units Subcutaneous TID WC  . insulin glargine  15 Units Subcutaneous QHS  . ipratropium-albuterol  3 mL Nebulization Once  . nicotine  21 mg Transdermal Daily  . potassium chloride  40 mEq Oral Once   Continuous Infusions: . clindamycin (CLEOCIN) IV Stopped (11/04/2020 0943)  . dextrose 5% lactated ringers Stopped (11/14/2020 0454)  .  lactated ringers Stopped (2020-11-08 0006)  . meropenem (MERREM) IV 1 g (11-08-20 1337)  . norepinephrine (LEVOPHED) Adult infusion 4 mcg/min (2020-11-08 0646)   PRN Meds:.dextrose  Interim History / Subjective:  Denies sob on bipap on arival, no cp or nausea,  Just having pain in L wrist /R post thigh wound sites   Objective   Blood pressure (!) 141/89, pulse (!) 116, temperature 100 F (37.8 C), temperature source Core, resp. rate (!) 21, height 5\' 5"  (1.651 m), weight 68 kg, SpO2 98 %.    Vent Mode: BIPAP FiO2 (%):  [50 %] 50 % Set Rate:  [10 bmp] 10 bmp PEEP:  [8 cmH20] 8 cmH20 Pressure Support:  [8  cmH20] 8 cmH20   Intake/Output Summary (Last 24 hours) at 2020/11/08 1420 Last data filed at 08-Nov-2020 12/23/2020 Gross per 24 hour  Intake 7010.6 ml  Output --  Net 7010.6 ml   Filed Weights   11/16/2020 1551  Weight: 68 kg    Examination: General: Tmax 100 HENT:bipap in place Lungs: minimal insp/exp rhonchi  Cardiovascular: RRR no S 3 Abdomen: abd soft Extremities: R post thigh wound packed/ L wrist dressing applied  Neuro: alert/ nl sensorium, moving all 4 ext    I personally reviewed images and agree with radiology impression as follows:  CXR:   Portable 4/3  No active dz/ R IJ ok position   Labs/imaging that I havepersonally reviewed  (right click and "Reselect all SmartList Selections" daily)  Cmet, cbc, lactate trend, urinalysis cxr and [facility]   Resolved Hospital Problem list     Assessment & Plan:   1) septic shock secondary to likely fascciitis in diabetic >>> agrees with clind/meropenem but add vanc based on bc x 2 both with staph >>> ortho consult pending/ Dr [facility] to review the CT's and decide on timing of additional interventions   2) Poorly controlled DM with blood sugar 890 on admit but minimal serum ketones >>> SSI / ok to eat if not going to OR   3) Active smoker ? AB  >>> rx duoneb/ not clear he needs any bipap as not hypoxemic nor having air hunger or need to compensate for med acidosis at this point   Best practice (right click and "Reselect all SmartList Selections" daily)  Diet:  NPO Pain/Anxiety/Delirium protocol (if indicated): No VAP protocol (if indicated): Not indicated DVT prophylaxis: SCD GI prophylaxis: PPI Glucose control:  SSI Yes Central venous access:  Yes, and it is still needed Arterial line:  N/A Foley:  Yes, and it is still needed Mobility:  bed rest  PT consulted: N/A Last date of multidisciplinary goals of care discussion n/a Code Status:  full code Disposition: ICU   Labs   CBC: Recent Labs  Lab 10/30/2020 1612  WBC 13.4*   HGB 10.3*  HCT 34.2*  MCV 91.9  PLT 438*    Basic Metabolic Panel: Recent Labs  Lab 11/14/2020 1612 11/08/2020 2311 Nov 08, 2020 0319 11/08/2020 1315  NA 129* 136 138 133*  K 3.5 2.3* 2.7* 3.0*  CL 82* 97* 101 102  CO2 27 26 28 24   GLUCOSE 980* 251* 141* 222*  BUN 25* 22* 22* 21*  CREATININE 1.92* 1.14 0.83 0.72  CALCIUM 8.3* 7.8* 7.5* 7.6*   GFR: Estimated Creatinine Clearance: 87.6 mL/min (by C-G formula based on SCr of 0.72 mg/dL). Recent Labs  Lab 11/04/2020 1612 11/04/2020 1708 11/17/2020 1900 11/11/2020 2157 November 08, 2020 0319  WBC 13.4*  --   --   --   --  LATICACIDVEN  --  3.7* 3.8* 7.2* 2.6*    Liver Function Tests: Recent Labs  Lab 11/01/2020 1612  AST 14*  ALT 13  ALKPHOS 91  BILITOT 1.1  PROT 7.1  ALBUMIN 2.1*   No results for input(s): LIPASE, AMYLASE in the last 168 hours. No results for input(s): AMMONIA in the last 168 hours.  ABG No results found for: PHART, PCO2ART, PO2ART, HCO3, TCO2, ACIDBASEDEF, O2SAT   Coagulation Profile: No results for input(s): INR, PROTIME in the last 168 hours.  Cardiac Enzymes: Recent Labs  Lab 11/08/2020 1612  CKTOTAL 14*    HbA1C: Hgb A1c MFr Bld  Date/Time Value Ref Range Status  08/18/2019 02:42 PM 11.8 (H) 4.8 - 5.6 % Final    Comment:    (NOTE) Pre diabetes:          5.7%-6.4% Diabetes:              >6.4% Glycemic control for   <7.0% adults with diabetes     CBG: Recent Labs  Lab March 08, 2021 0151 March 08, 2021 0251 March 08, 2021 0353 March 08, 2021 0453 March 08, 2021 1102  GLUCAP 177* 150* 147* 153* 249*       Past Medical History:  He,  has a past medical history of Diabetes mellitus without complication (HCC), Emphysema lung (HCC), and Hypertension.   Surgical History:   Past Surgical History:  Procedure Laterality Date  . ABDOMINAL SURGERY       Social History:   reports that he has been smoking. He has been smoking about 1.50 packs per day. He has never used smokeless tobacco. He reports that he does not drink  alcohol and does not use drugs.   Family History:  His family history is not on file.   Allergies Allergies  Allergen Reactions  . Penicillins Other (See Comments)    Pt is unsure of reaction. States PCP told him he was allergic. Did it involve swelling of the face/tongue/throat, SOB, or low BP? Unknown Did it involve sudden or severe rash/hives, skin peeling, or any reaction on the inside of your mouth or nose? Unknown Did you need to seek medical attention at a hospital or doctor's office? Unknown When did it last happen?unk If all above answers are "NO", may proceed with cephalosporin use.      Home Medications  Prior to Admission medications   Medication Sig Start Date End Date Taking? Authorizing Provider  albuterol (VENTOLIN HFA) 108 (90 Base) MCG/ACT inhaler Inhale 2 puffs into the lungs every 4 (four) hours as needed for wheezing or shortness of breath. 05/26/19   [provider]  DULoxetine (CYMBALTA) 60 MG capsule Take 60 mg by mouth daily. 05/26/19   [provider]  HUMALOG KWIKPEN 100 UNIT/ML KwikPen Inject 16 Units into the skin 3 (three) times daily with meals.  05/27/19   [provider]  HYDROcodone-acetaminophen (NORCO) 5-325 MG tablet Take 1 tablet by mouth every 6 (six) hours as needed. 09/28/20   Adonis HugueninZamora, Erin R, NP  ibuprofen (ADVIL) 600 MG tablet Take 1 tablet (600 mg total) by mouth every 8 (eight) hours as needed for moderate pain. 09/05/20   Terrilee FilesButler, Josee Speece C, MD  LEVEMIR 100 UNIT/ML injection Inject 23 Units into the skin 2 (two) times daily. 05/26/19   [provider]  meloxicam (MOBIC) 15 MG tablet Take 1 tablet (15 mg total) by mouth daily. 09/28/20   Adonis HugueninZamora, Erin R, NP  nicotine (NICODERM CQ - DOSED IN MG/24 HOURS) 14 mg/24hr patch Place 1 patch (  14 mg total) onto the skin daily. 08/23/19   Pokhrel, Rebekah Chesterfield, MD  pravastatin (PRAVACHOL) 20 MG tablet Take 20 mg by mouth at bedtime. 05/26/19   [provider]  sitaGLIPtin  (JANUVIA) 50 MG tablet Take 50 mg by mouth daily.    [provider]  SPIRIVA HANDIHALER 18 MCG inhalation capsule Place 18 mcg into inhaler and inhale daily. 05/26/19   [provider]  SYMBICORT 160-4.5 MCG/ACT inhaler Inhale 2 puffs into the lungs 2 (two) times daily. 05/26/19   [provider]  tadalafil (CIALIS) 10 MG tablet Take 10 mg by mouth daily as needed for erectile dysfunction.    [provider]  traZODone (DESYREL) 100 MG tablet Take 100 mg by mouth at bedtime. 05/26/19   [provider]      The patient is critically ill with multiple organ systems failure and requires high complexity decision making for assessment and support, frequent evaluation and titration of therapies, application of advanced monitoring technologies and extensive interpretation of multiple databases. Critical Care Time devoted to patient care services described in this note is 60 minutes.    Sandrea Hughs, MD Pulmonary and Critical Care Medicine  Healthcare Cell [phone]   After 7:00 pm call Elink  [phone]

## 2020-10-23 NOTE — ED Notes (Signed)
Pt pressure trending down and hospitalist made aware. To give orders soon. Will monitor pt.

## 2020-10-23 NOTE — Anesthesia Postprocedure Evaluation (Signed)
Anesthesia Post Note  Patient: Ronald Brady [facility]  Procedure(s) Performed: INCISION AND DRAINAGE  AND DEBRIDEMENT LEFT WRIST AND RIGHT POSTERIOR THIGH APPLICATION OF WOUND VACS TO LEFT WRIST WOUND AND RIGHT POSTERIOR THIGH WOUND (Left )     Patient location during evaluation: PACU Anesthesia Type: General Level of consciousness: awake and alert Pain management: pain level controlled Vital Signs Assessment: post-procedure vital signs reviewed and stable Respiratory status: spontaneous breathing, nonlabored ventilation, respiratory function stable and patient connected to nasal cannula oxygen Cardiovascular status: blood pressure returned to baseline and stable Postop Assessment: no apparent nausea or vomiting Anesthetic complications: no   No complications documented.  Last Vitals:  Vitals:   11/10/2020 2100 11/06/2020 2106  BP: 96/62   Pulse: 96 93  Resp: (!) 25 (!) 24  Temp:  36.6 C  SpO2: 95% 97%    Last Pain:  Vitals:   11/06/2020 2032  TempSrc:   PainSc: 10-Worst pain ever                 [name] A.

## 2020-10-23 NOTE — ED Notes (Signed)
Pt heard yelling out. This nurse went to check and pt states "I cant breathe." Pt had taken pulse ox off of finger. Replaced pulse ox and sats 66-67% on RA with perfect wave form. Pt placed on 4L [place] oxygen increased to 75%. 15L NRB placed on pt with increase to 98%. EDP made aware and placed orders for STAT labs, CXR and continuous neb with bipap. Will continue to monitor

## 2020-10-23 NOTE — Consult Note (Signed)
[place] for Infectious Disease       Reason for Consult: STaph bacteremia    Referring Physician: CHAMP autoconsult  Principal Problem:   Severe sepsis [facility]) Active Problems:   Abscess of right thigh   Abscess of left forearm   Diabetes mellitus ([place])   Hyperosmolar hyperglycemic state (HHS) ([place])   AKI (acute kidney injury) ([place])   Acute lower UTI   Emphysema lung ([place])   . chlorhexidine  15 mL Mouth Rinse BID  . Chlorhexidine Gluconate Cloth  6 each Topical Daily  . heparin  5,000 Units Subcutaneous Q8H  . insulin aspart  0-5 Units Subcutaneous QHS  . insulin aspart  0-9 Units Subcutaneous TID WC  . insulin aspart  4 Units Subcutaneous TID WC  . insulin glargine  15 Units Subcutaneous QHS  . ipratropium-albuterol  3 mL Nebulization QID  . mouth rinse  15 mL Mouth Rinse q12n4p  . nicotine  21 mg Transdermal Daily  . potassium chloride  40 mEq Oral Once  . sodium chloride flush  10-40 mL Intracatheter Q12H    Recommendations: Cefazolin  Will stop clindamycin and meropenem Orthopedic evaluation of the abscesses of the leg Repeat blood cultures after surgical intervention TTE   Assessment: He has two foci of infection in his right hamstring and left forearm and positive blood cultures with MSSA c/w disseminated MSSA infection.  Will change antibiotics to cefazolin and stop clindamycin/meropenem.    Penicillin allergy.  History is of an intolerance with GI upset.  I will proceed with cefazolin.    Dr. Gale Journey to follow up tomorrow  Antibiotics: Clindamycin and meropenem  HPI: Ronald Brady is a 60 y.o. male with a history of diabetes with last Hgb A1c of 13.3 last month who presented to [facility] after being found down at home by patients sister.  He was soiled and had difficulty walking due to leg weakness.  He was hypothermic on admission, tachycardic and a lactate of 3.7.  Blood cultures now positive in 3/4 bottles with GPC and BCID positive  for MSSA.  + acute renal failure.  CT of his right femur with elongated gas and fluid collection along the surface of the hamstring muscle and some extension deep in areas and is along the entire length.  Clindamycin and meropenem was started.  Now transferred to [facility] ICU.  He was recently treated for an abscess of his left wrist by his PCP with Bactrim x 2 and that persisted.  Recent HIV and hepatitis C negative.  ESR was 62 and CRP of 106.9.     Review of Systems:  Constitutional: positive for fevers, chills, fatigue and malaise Respiratory: negative for cough or sputum Gastrointestinal: negative for nausea and diarrhea Musculoskeletal: positive for right leg pain All other systems reviewed and are negative    Past Medical History:  Diagnosis Date  . Diabetes mellitus without complication ([place])   . Emphysema lung ([place])   . Hypertension     Social History   Tobacco Use  . Smoking status: Current Every Day Smoker    Packs/day: 1.50  . Smokeless tobacco: Never Used  Substance Use Topics  . Alcohol use: Never  . Drug use: Never    [place]: + hypertension  Allergies  Allergen Reactions  . Penicillins Other (See Comments)    Pt is unsure of reaction. States PCP told him he was allergic. Did it involve swelling of the face/tongue/throat, SOB, or low BP? Unknown Did  it involve sudden or severe rash/hives, skin peeling, or any reaction on the inside of your mouth or nose? Unknown Did you need to seek medical attention at a hospital or doctor's office? Unknown When did it last happen?unk If all above answers are "NO", may proceed with cephalosporin use.   he reports penicillin made him sick to his stomach  Physical Exam: Constitutional: in no apparent distress  Vitals:   11/04/2020 1324 10/25/2020 1330  BP:  (!) 141/89  Pulse:  (!) 116  Resp:  (!) 21  Temp:    SpO2: 98% 98%   EYES: anicteric ENMT: no thrush Cardiovascular: Cor RRR Respiratory: clear; GI: soft,  nt Musculoskeletal: right leg bent, some induration over posterior area, tenderness, no fluctuance noted Skin: negatives: no rash  Lab Results  Component Value Date   WBC 13.4 (H) 11/07/2020   HGB 10.3 (L) 11/13/2020   HCT 34.2 (L) 11/16/2020   MCV 91.9 11/04/2020   PLT 438 (H) 11/18/2020    Lab Results  Component Value Date   CREATININE 0.72 11/07/2020   BUN 21 (H) 11/16/2020   NA 133 (L) 11/10/2020   K 3.0 (L) 11/10/2020   CL 102 11/19/2020   CO2 24 10/29/2020    Lab Results  Component Value Date   ALT 13 11/14/2020   AST 14 (L) 11/16/2020   ALKPHOS 91 11/11/2020     Microbiology: Recent Results (from the past 240 hour(s))  Blood culture (routine x 2)     Status: None (Preliminary result)   Collection Time: 10/21/2020  4:12 PM   Specimen: Right Antecubital; Blood  Result Value Ref Range Status   Specimen Description   Final    RIGHT ANTECUBITAL BOTTLES DRAWN AEROBIC AND ANAEROBIC Performed at [facility], [address], [place], [place] [address]    Special Requests   Final    Blood Culture results may not be optimal due to an inadequate volume of blood received in culture bottles Performed at [facility], [address], [place], [place] [address]    Culture  Setup Time   Final    GRAM POSITIVE COCCI Aerobic bottle Gram Stain Report Called to,Read Back By and Verified With: CRAWFORD,H_0  BY MATTHEWS, B 4.3.22 CRITICAL VALUE NOTED.  VALUE IS CONSISTENT WITH PREVIOUSLY REPORTED AND CALLED VALUE. Performed at [facility], [place] [address], [place], [place] [address]    Culture GRAM POSITIVE COCCI  Final   Report Status PENDING  Incomplete  Blood culture (routine x 2)     Status: None (Preliminary result)   Collection Time: 10/31/2020  4:13 PM   Specimen: Left Antecubital; Blood  Result Value Ref Range Status   Specimen Description   Final    LEFT ANTECUBITAL BOTTLES DRAWN AEROBIC AND ANAEROBIC Performed at [facility], [address], [place], [place]  [address]    Special Requests   Final    Blood Culture adequate volume Performed at [facility], [address], [place], [place] [address]    Culture  Setup Time   Final    GRAM POSITIVE COCCI AEROBIC AND ANAEROBIC BOTTLE Gram Stain Report Called to,Read Back By and Verified With: CRAWFORD,H_1  BY MATTHEWS,B 4.3.22 Organism ID to follow CRITICAL RESULT CALLED TO, READ BACK BY AND VERIFIED WITH: S CHRISTY PHARMD _2  11/10/2020 EB Performed at [facility], [place] [address], [place],  [address]    Culture [facility] POSITIVE COCCI  Final   Report Status PENDING  Incomplete  Blood Culture  ID Panel (Reflexed)     Status: Abnormal   Collection Time: 10/31/2020  4:13 PM  Result Value Ref Range Status   Enterococcus faecalis NOT DETECTED NOT DETECTED Final   Enterococcus Faecium NOT DETECTED NOT DETECTED Final   Listeria monocytogenes NOT DETECTED NOT DETECTED Final   Staphylococcus species DETECTED (A) NOT DETECTED Final    Comment: CRITICAL RESULT CALLED TO, READ BACK BY AND VERIFIED WITH: S CHRISTY PHARMD _0  10/25/2020 EB    Staphylococcus aureus (BCID) DETECTED (A) NOT DETECTED Final    Comment: CRITICAL RESULT CALLED TO, READ BACK BY AND VERIFIED WITH: S CHRISTY PHARMD _1  10/28/2020 EB    Staphylococcus epidermidis NOT DETECTED NOT DETECTED Final   Staphylococcus lugdunensis NOT DETECTED NOT DETECTED Final   Streptococcus species NOT DETECTED NOT DETECTED Final   Streptococcus agalactiae NOT DETECTED NOT DETECTED Final   Streptococcus pneumoniae NOT DETECTED NOT DETECTED Final   Streptococcus pyogenes NOT DETECTED NOT DETECTED Final   A.calcoaceticus-baumannii NOT DETECTED NOT DETECTED Final   Bacteroides fragilis NOT DETECTED NOT DETECTED Final   Enterobacterales NOT DETECTED NOT DETECTED Final   Enterobacter cloacae complex NOT DETECTED NOT DETECTED Final   Escherichia coli NOT DETECTED NOT DETECTED Final   Klebsiella aerogenes NOT DETECTED NOT DETECTED Final   Klebsiella  oxytoca NOT DETECTED NOT DETECTED Final   Klebsiella pneumoniae NOT DETECTED NOT DETECTED Final   Proteus species NOT DETECTED NOT DETECTED Final   Salmonella species NOT DETECTED NOT DETECTED Final   Serratia marcescens NOT DETECTED NOT DETECTED Final   Haemophilus influenzae NOT DETECTED NOT DETECTED Final   Neisseria meningitidis NOT DETECTED NOT DETECTED Final   Pseudomonas aeruginosa NOT DETECTED NOT DETECTED Final   Stenotrophomonas maltophilia NOT DETECTED NOT DETECTED Final   Candida albicans NOT DETECTED NOT DETECTED Final   Candida auris NOT DETECTED NOT DETECTED Final   Candida glabrata NOT DETECTED NOT DETECTED Final   Candida krusei NOT DETECTED NOT DETECTED Final   Candida parapsilosis NOT DETECTED NOT DETECTED Final   Candida tropicalis NOT DETECTED NOT DETECTED Final   Cryptococcus neoformans/gattii NOT DETECTED NOT DETECTED Final   Meth resistant mecA/C and MREJ NOT DETECTED NOT DETECTED Final    Comment: Performed at [facility], [address], [place], [place] [address]  Resp Panel by RT-PCR (Flu A&B, Covid) Nasopharyngeal Swab     Status: None   Collection Time: 10/31/2020  6:16 PM   Specimen: Nasopharyngeal Swab; Nasopharyngeal(NP) swabs in vial transport medium  Result Value Ref Range Status   SARS Coronavirus 2 by RT PCR NEGATIVE NEGATIVE Final    Comment: (NOTE) SARS-CoV-2 target nucleic acids are NOT DETECTED.  The SARS-CoV-2 RNA is generally detectable in upper respiratory specimens during the acute phase of infection. The lowest concentration of SARS-CoV-2 viral copies this assay can detect is 138 copies/mL. A negative result does not preclude SARS-Cov-2 infection and should not be used as the sole basis for treatment or other patient management decisions. A negative result may occur with  improper specimen collection/handling, submission of specimen other than nasopharyngeal swab, presence of viral mutation(s) within the areas targeted by this  assay, and inadequate number of viral copies(<138 copies/mL). A negative result must be combined with clinical observations, patient history, and epidemiological information. The expected result is Negative.  Fact Sheet for Patients:  EntrepreneurPulse.com.au  Fact Sheet for Healthcare Providers:  IncredibleEmployment.be  This test is no t yet approved or cleared by the [place] FDA and  has been  authorized for detection and/or diagnosis of SARS-CoV-2 by FDA under an Emergency Use Authorization (EUA). This EUA will remain  in effect (meaning this test can be used) for the duration of the COVID-19 declaration under Section 564(b)(1) of the Act, 21 U.S.C.section 360bbb-3(b)(1), unless the authorization is terminated  or revoked sooner.       Influenza A by PCR NEGATIVE NEGATIVE Final   Influenza B by PCR NEGATIVE NEGATIVE Final    Comment: (NOTE) The Xpert Xpress SARS-CoV-2/FLU/RSV plus assay is intended as an aid in the diagnosis of influenza from Nasopharyngeal swab specimens and should not be used as a sole basis for treatment. Nasal washings and aspirates are unacceptable for Xpert Xpress SARS-CoV-2/FLU/RSV testing.  Fact Sheet for Patients: EntrepreneurPulse.com.au  Fact Sheet for Healthcare Providers: IncredibleEmployment.be  This test is not yet approved or cleared by the [place] FDA and has been authorized for detection and/or diagnosis of SARS-CoV-2 by FDA under an Emergency Use Authorization (EUA). This EUA will remain in effect (meaning this test can be used) for the duration of the COVID-19 declaration under Section 564(b)(1) of the Act, 21 U.S.C. section 360bbb-3(b)(1), unless the authorization is terminated or revoked.  Performed at [facility], [address], [place], [place] [address]     Robert W Comer, [place] for Infectious Disease [place] [email or web address] 11/03/2020, 3:29 PM

## 2020-10-23 NOTE — Progress Notes (Signed)
Inpatient Diabetes Program Recommendations  AACE/ADA: New Consensus Statement on Inpatient Glycemic Control (2015)  Target Ranges:  Prepandial:   less than 140 mg/dL      Peak postprandial:   less than 180 mg/dL (1-2 hours)      Critically ill patients:  140 - 180 mg/dL   Lab Results  Component Value Date   GLUCAP 153 (H) Oct 28, 2020   HGBA1C 11.8 (H) 08/18/2019    Review of Glycemic Control Results for BRYAR, DAHMS (MRN [id number]) as of 10-28-2020 09:34  Ref. Range October 28, 2020 01:51 2020/10/28 02:51 2020/10/28 03:53 10/28/20 04:53  Glucose-Capillary Latest Ref Range: 70 - 99 mg/dL 837 (H) 290 (H) 211 (H) 153 (H)   Diabetes history: DM  Outpatient Diabetes medications:  Humalog 16 units tid Levemir 23 units bid Current orders for Inpatient glycemic control:  Lantus 15 units daily/IV insulin Inpatient Diabetes Program Recommendations:   Note patient transitioning off insulin drip- please add Novolog sensitive q 4 hours.  Note patient to transfer to [facility] today.  Will follow.   Thanks,  Beryl Meager, RN, BC-ADM Inpatient Diabetes Coordinator Pager [phone]

## 2020-10-23 NOTE — Transfer of Care (Signed)
Immediate Anesthesia Transfer of Care Note  Patient: Ronald Brady [facility]  Procedure(s) Performed: INCISION AND DRAINAGE  AND DEBRIDEMENT LEFT WRIST AND RIGHT POSTERIOR THIGH APPLICATION OF WOUND VACS TO LEFT WRIST WOUND AND RIGHT POSTERIOR THIGH WOUND (Left )  Patient Location: PACU  Anesthesia Type:General  Level of Consciousness: awake, alert  and oriented  Airway & Oxygen Therapy: Patient Spontanous Breathing and Patient connected to face mask oxygen  Post-op Assessment: Report given to RN and Post -op Vital signs reviewed and stable  Post vital signs: Reviewed and stable  Last Vitals:  Vitals Value Taken Time  BP 97/68 11/06/2020 2033  Temp    Pulse 99 11/14/2020 2038  Resp 28 11/16/2020 2038  SpO2 100 % 11/06/2020 2038  Vitals shown include unvalidated device data.  Last Pain:  Vitals:   11/01/2020 1755  TempSrc:   PainSc: 6          Complications: No complications documented.

## 2020-10-23 NOTE — Progress Notes (Signed)
Patient requested his daughter NOT receive any information on his prognosis.  Patient requested only his sister - Zella Ball receive updates.

## 2020-10-23 NOTE — Progress Notes (Signed)
Pharmacy Antibiotic Note  Ronald Brady is a 60 y.o. male admitted on 11/07/2020 with skin infection and bacteremia.  Pharmacy has been consulted for cefazolin dosing for MSSA bacteremia.  Plan: Cefazolin 2 g iv q 8 hours.   Will f/u renal function, culture results, and clinical course.   Height: 5\' 5"  (165.1 cm) Weight: 70 kg (154 lb 5.2 oz) IBW/kg (Calculated) : 61.5  Temp (24hrs), Avg:98.4 F (36.9 C), Min:96.7 F (35.9 C), Max:100 F (37.8 C)  Recent Labs  Lab 11/01/2020 1612 10/30/2020 1708 10/27/2020 1900 11/19/2020 2157 11/03/2020 2311 10/30/2020 0319 10/29/2020 1315 10/27/2020 1526  WBC 13.4*  --   --   --   --   --   --  10.3  CREATININE 1.92*  --   --   --  1.14 0.83 0.72  --   LATICACIDVEN  --  3.7* 3.8* 7.2*  --  2.6*  --   --     Estimated Creatinine Clearance: 87.6 mL/min (by C-G formula based on SCr of 0.72 mg/dL).    Allergies  Allergen Reactions  . Codeine Nausea And Vomiting  . Penicillins Other (See Comments)    Pt is unsure of reaction. States PCP told him he was allergic. Did it involve swelling of the face/tongue/throat, SOB, or low BP? Unknown Did it involve sudden or severe rash/hives, skin peeling, or any reaction on the inside of your mouth or nose? Unknown Did you need to seek medical attention at a hospital or doctor's office? Unknown When did it last happen?unk If all above answers are "NO", may proceed with cephalosporin use.     Antimicrobials this admission: 4/2 vancomycin x 1  4/3 fluconazole x 1 4/2 clindamycin >> 4/3 4/2 meropneme >> 4/3 4/3 cefazolin >>  Dose adjustments this admission:   Microbiology results: 4/2 BCx: GPC in 3/4, MSSA per BCID 4/2 UCx:   4/3 MRSA PCR:   Thank you for allowing pharmacy to be a part of this patient's care.  6/3 D 11/02/2020 4:31 PM

## 2020-10-23 NOTE — Consult Note (Signed)
Reason for Consult:abscess right hamstring and left volar forearm with exposed tendons Referring Physician: Sandrea Hughs MD  KARL ERWAY is an 60 y.o. male.  HPI: 59 year old male with diabetes was seen by orthopedic PA on 09/29/2018 with a hamstring tear.  He was found by his sister yesterday at his home in a chair and not been out of the chair for 4 days found in stool and urine hypothermic, glucose 900+ dehydrated, septic.  Patient had open area right hamstring proximally with 4 cm deep foul smelling drainage wound.  Also an area over the left volar wrist and distal forearm with exposed tendon likely palmaris longus also with foul-smelling drainage.  Past Medical History:  Diagnosis Date  . Diabetes mellitus without complication (HCC)   . Emphysema lung (HCC)   . Hypertension     Past Surgical History:  Procedure Laterality Date  . ABDOMINAL SURGERY      No family history on file.  Social History:  reports that he has been smoking. He has been smoking about 1.50 packs per day. He has never used smokeless tobacco. He reports that he does not drink alcohol and does not use drugs.  Allergies:  Allergies  Allergen Reactions  . Codeine Nausea And Vomiting  . Penicillins Other (See Comments)    Pt is unsure of reaction. States PCP told him he was allergic. Did it involve swelling of the face/tongue/throat, SOB, or low BP? Unknown Did it involve sudden or severe rash/hives, skin peeling, or any reaction on the inside of your mouth or nose? Unknown Did you need to seek medical attention at a hospital or doctor's office? Unknown When did it last happen?unk If all above answers are "NO", may proceed with cephalosporin use.     Medications: I have reviewed the patient's current medications.  Results for orders placed or performed during the hospital encounter of 10/29/2020 (from the past 48 hour(s))  CBC     Status: Abnormal   Collection Time: 11/17/2020  4:12 PM  Result  Value Ref Range   WBC 13.4 (H) 4.0 - 10.5 K/uL   RBC 3.72 (L) 4.22 - 5.81 MIL/uL   Hemoglobin 10.3 (L) 13.0 - 17.0 g/dL   HCT 16.1 (L) 09.6 - 04.5 %   MCV 91.9 80.0 - 100.0 fL   MCH 27.7 26.0 - 34.0 pg   MCHC 30.1 30.0 - 36.0 g/dL   RDW 40.9 81.1 - 91.4 %   Platelets 438 (H) 150 - 400 K/uL   nRBC 0.0 0.0 - 0.2 %    Comment: Performed at [facility], [address] [place], [place] [address]  Comprehensive metabolic panel     Status: Abnormal   Collection Time: 10/26/2020  4:12 PM  Result Value Ref Range   Sodium 129 (L) 135 - 145 mmol/L   Potassium 3.5 3.5 - 5.1 mmol/L   Chloride 82 (L) 98 - 111 mmol/L   CO2 27 22 - 32 mmol/L   Glucose, Bld 980 (HH) 70 - 99 mg/dL    Comment: Glucose reference range applies only to samples taken after fasting for at least 8 hours. CRITICAL RESULT CALLED TO, READ BACK BY AND VERIFIED WITH: GANTT,E 1722 10/29/2020 COLEMAN,R    BUN 25 (H) 6 - 20 mg/dL   Creatinine, Ser 6.21 (H) 0.61 - 1.24 mg/dL   Calcium 8.3 (L) 8.9 - 10.3 mg/dL   Total Protein 7.1 6.5 - 8.1 g/dL   Albumin 2.1 (L) 3.5 - 5.0 g/dL  AST 14 (L) 15 - 41 U/L   ALT 13 0 - 44 U/L   Alkaline Phosphatase 91 38 - 126 U/L   Total Bilirubin 1.1 0.3 - 1.2 mg/dL   GFR, Estimated 40 (L) >60 mL/min    Comment: (NOTE) Calculated using the CKD-EPI Creatinine Equation (2021)    Anion gap 20 (H) 5 - 15    Comment: Performed at [facility], [address], [place], [place] [address]  Blood culture (routine x 2)     Status: None (Preliminary result)   Collection Time: 11/06/2020  4:12 PM   Specimen: Right Antecubital; Blood  Result Value Ref Range   Specimen Description      RIGHT ANTECUBITAL BOTTLES DRAWN AEROBIC AND ANAEROBIC Performed at [facility] [address] [place], [place] [address]    Special Requests      Blood Culture results may not be optimal due to an inadequate volume of blood received in culture bottles Performed at [facility], [address], [place], [place] [address]     Culture  Setup Time      GRAM POSITIVE COCCI Aerobic bottle Gram Stain Report Called to,Read Back By and Verified With: CRAWFORD,H@1015  BY MATTHEWS, B 4.3.22 CRITICAL VALUE NOTED.  VALUE IS CONSISTENT WITH PREVIOUSLY REPORTED AND CALLED VALUE. Performed at [facility], [address] [place], [place] [address]    Culture GRAM POSITIVE COCCI    Report Status PENDING   CK     Status: Abnormal   Collection Time: 11/04/2020  4:12 PM  Result Value Ref Range   Total CK 14 (L) 49 - 397 U/L    Comment: Performed at [facility], [address] [place], [place] [address]  Beta-hydroxybutyric acid     Status: Abnormal   Collection Time: 11/06/2020  4:12 PM  Result Value Ref Range   Beta-Hydroxybutyric Acid 3.20 (H) 0.05 - 0.27 mmol/L    Comment: Performed at [facility], [address], [place], [place] [address]  Blood culture (routine x 2)     Status: None (Preliminary result)   Collection Time: 11/16/2020  4:13 PM   Specimen: Left Antecubital; Blood  Result Value Ref Range   Specimen Description      LEFT ANTECUBITAL BOTTLES DRAWN AEROBIC AND ANAEROBIC Performed at [facility], [address], [place], [place] [address]    Special Requests      Blood Culture adequate volume Performed at [facility], [address] [place], [place] [address]    Culture  Setup Time      GRAM POSITIVE COCCI AEROBIC AND ANAEROBIC BOTTLE Gram Stain Report Called to,Read Back By and Verified With: CRAWFORD,H@1015  BY MATTHEWS,B 4.3.22 Organism ID to follow CRITICAL RESULT CALLED TO, READ BACK BY AND VERIFIED WITH: S CHRISTY PHARMD @1453  11/12/2020 EB Performed at [facility], [address] [place], [place]    Culture GRAM POSITIVE COCCI    Report Status PENDING   Blood Culture ID Panel (Reflexed)     Status: Abnormal   Collection Time: 11/15/2020  4:13 PM  Result Value Ref Range   Enterococcus faecalis NOT DETECTED NOT DETECTED   Enterococcus Faecium NOT DETECTED NOT DETECTED    Listeria monocytogenes NOT DETECTED NOT DETECTED   Staphylococcus species DETECTED (A) NOT DETECTED    Comment: CRITICAL RESULT CALLED TO, READ BACK BY AND VERIFIED WITH: S CHRISTY PHARMD @1453  10/25/2020 EB    Staphylococcus aureus (BCID) DETECTED (A) NOT DETECTED    Comment:  CRITICAL RESULT CALLED TO, READ BACK BY AND VERIFIED WITH: S CHRISTY PHARMD @1453  10/28/2020 EB    Staphylococcus epidermidis NOT DETECTED NOT DETECTED   Staphylococcus lugdunensis NOT DETECTED NOT DETECTED   Streptococcus species NOT DETECTED NOT DETECTED   Streptococcus agalactiae NOT DETECTED NOT DETECTED   Streptococcus pneumoniae NOT DETECTED NOT DETECTED   Streptococcus pyogenes NOT DETECTED NOT DETECTED   A.calcoaceticus-baumannii NOT DETECTED NOT DETECTED   Bacteroides fragilis NOT DETECTED NOT DETECTED   Enterobacterales NOT DETECTED NOT DETECTED   Enterobacter cloacae complex NOT DETECTED NOT DETECTED   Escherichia coli NOT DETECTED NOT DETECTED   Klebsiella aerogenes NOT DETECTED NOT DETECTED   Klebsiella oxytoca NOT DETECTED NOT DETECTED   Klebsiella pneumoniae NOT DETECTED NOT DETECTED   Proteus species NOT DETECTED NOT DETECTED   Salmonella species NOT DETECTED NOT DETECTED   Serratia marcescens NOT DETECTED NOT DETECTED   Haemophilus influenzae NOT DETECTED NOT DETECTED   Neisseria meningitidis NOT DETECTED NOT DETECTED   Pseudomonas aeruginosa NOT DETECTED NOT DETECTED   Stenotrophomonas maltophilia NOT DETECTED NOT DETECTED   Candida albicans NOT DETECTED NOT DETECTED   Candida auris NOT DETECTED NOT DETECTED   Candida glabrata NOT DETECTED NOT DETECTED   Candida krusei NOT DETECTED NOT DETECTED   Candida parapsilosis NOT DETECTED NOT DETECTED   Candida tropicalis NOT DETECTED NOT DETECTED   Cryptococcus neoformans/gattii NOT DETECTED NOT DETECTED   Meth resistant mecA/C and MREJ NOT DETECTED NOT DETECTED    Comment: Performed at [facility], [address], [place], [place]   Lactic acid, plasma     Status: Abnormal   Collection Time: 11/15/2020  5:08 PM  Result Value Ref Range   Lactic Acid, Venous 3.7 (HH) 0.5 - 1.9 mmol/L    Comment: CRITICAL RESULT CALLED TO, READ BACK BY AND VERIFIED WITH: GANTT,E 1738 10/31/2020 COLEMAN,R Performed at [facility], [address] [place], [place]   CBG monitoring, ED     Status: Abnormal   Collection Time: 10/26/2020  5:55 PM  Result Value Ref Range   Glucose-Capillary >600 (HH) 70 - 99 mg/dL    Comment: Glucose reference range applies only to samples taken after fasting for at least 8 hours.  Urinalysis, Routine w reflex microscopic Urine, Catheterized     Status: Abnormal   Collection Time: 11/04/2020  6:15 PM  Result Value Ref Range   Color, Urine YELLOW YELLOW   APPearance CLOUDY (A) CLEAR   Specific Gravity, Urine 1.018 1.005 - 1.030   pH 5.0 5.0 - 8.0   Glucose, UA >=500 (A) NEGATIVE mg/dL   Hgb urine dipstick SMALL (A) NEGATIVE   Bilirubin Urine NEGATIVE NEGATIVE   Ketones, ur 5 (A) NEGATIVE mg/dL   Protein, ur 30 (A) NEGATIVE mg/dL   Nitrite NEGATIVE NEGATIVE   Leukocytes,Ua MODERATE (A) NEGATIVE   RBC / HPF >50 (H) 0 - 5 RBC/hpf   WBC, UA >50 (H) 0 - 5 WBC/hpf   Bacteria, UA RARE (A) NONE SEEN   Squamous Epithelial / LPF 0-5 0 - 5   WBC Clumps PRESENT    Mucus PRESENT    Budding Yeast PRESENT    Hyaline Casts, UA PRESENT    Ca Oxalate Crys, UA PRESENT     Comment: Performed at [facility], [address], [place], [place]  Resp Panel by RT-PCR (Flu A&B, Covid) Nasopharyngeal Swab     Status: None   Collection Time: 11/04/2020  6:16 PM  Specimen: Nasopharyngeal Swab; Nasopharyngeal(NP) swabs in vial transport medium  Result Value Ref Range   SARS Coronavirus 2 by RT PCR NEGATIVE NEGATIVE    Comment: (NOTE) SARS-CoV-2 target nucleic acids are NOT DETECTED.  The SARS-CoV-2 RNA is generally detectable in upper respiratory specimens during the acute phase of infection. The  lowest concentration of SARS-CoV-2 viral copies this assay can detect is 138 copies/mL. A negative result does not preclude SARS-Cov-2 infection and should not be used as the sole basis for treatment or other patient management decisions. A negative result may occur with  improper specimen collection/handling, submission of specimen other than nasopharyngeal swab, presence of viral mutation(s) within the areas targeted by this assay, and inadequate number of viral copies(<138 copies/mL). A negative result must be combined with clinical observations, patient history, and epidemiological information. The expected result is Negative.  Fact Sheet for Patients:  BloggerCourse.com  Fact Sheet for Healthcare Providers:  SeriousBroker.it  This test is no t yet approved or cleared by the [place] FDA and  has been authorized for detection and/or diagnosis of SARS-CoV-2 by FDA under an Emergency Use Authorization (EUA). This EUA will remain  in effect (meaning this test can be used) for the duration of the COVID-19 declaration under Section 564(b)(1) of the Act, 21 U.S.C.section 360bbb-3(b)(1), unless the authorization is terminated  or revoked sooner.       Influenza A by PCR NEGATIVE NEGATIVE   Influenza B by PCR NEGATIVE NEGATIVE    Comment: (NOTE) The Xpert Xpress SARS-CoV-2/FLU/RSV plus assay is intended as an aid in the diagnosis of influenza from Nasopharyngeal swab specimens and should not be used as a sole basis for treatment. Nasal washings and aspirates are unacceptable for Xpert Xpress SARS-CoV-2/FLU/RSV testing.  Fact Sheet for Patients: BloggerCourse.com  Fact Sheet for Healthcare Providers: SeriousBroker.it  This test is not yet approved or cleared by the [place] FDA and has been authorized for detection and/or diagnosis of SARS-CoV-2 by FDA under an Emergency  Use Authorization (EUA). This EUA will remain in effect (meaning this test can be used) for the duration of the COVID-19 declaration under Section 564(b)(1) of the Act, 21 U.S.C. section 360bbb-3(b)(1), unless the authorization is terminated or revoked.  Performed at [facility], [address] [place], [place] [address]   CBG monitoring, ED     Status: Abnormal   Collection Time: 10/24/2020  6:31 PM  Result Value Ref Range   Glucose-Capillary >600 (HH) 70 - 99 mg/dL    Comment: Glucose reference range applies only to samples taken after fasting for at least 8 hours.  CBG monitoring, ED     Status: Abnormal   Collection Time: 11/17/2020  6:58 PM  Result Value Ref Range   Glucose-Capillary >600 (HH) 70 - 99 mg/dL    Comment: Glucose reference range applies only to samples taken after fasting for at least 8 hours.  Lactic acid, plasma     Status: Abnormal   Collection Time: 10/24/2020  7:00 PM  Result Value Ref Range   Lactic Acid, Venous 3.8 (HH) 0.5 - 1.9 mmol/L    Comment: CRITICAL RESULT CALLED TO, READ BACK BY AND VERIFIED WITH: GOWENS,T 1937 10/31/2020 COLEMAN,R Performed at [facility], [address], [place], [place] [address]   CBG monitoring, ED     Status: Abnormal   Collection Time: 10/26/2020  7:47 PM  Result Value Ref Range   Glucose-Capillary 545 (HH) 70 - 99 mg/dL    Comment: Glucose reference range  applies only to samples taken after fasting for at least 8 hours.   Comment 1 Notify RN    Comment 2 Document in Chart   CBG monitoring, ED     Status: Abnormal   Collection Time: 11/19/2020  8:20 PM  Result Value Ref Range   Glucose-Capillary 506 (HH) 70 - 99 mg/dL    Comment: Glucose reference range applies only to samples taken after fasting for at least 8 hours.   Comment 1 Notify RN   CBG monitoring, ED     Status: Abnormal   Collection Time: 11/12/2020  8:49 PM  Result Value Ref Range   Glucose-Capillary 432 (H) 70 - 99 mg/dL    Comment: Glucose reference range applies  only to samples taken after fasting for at least 8 hours.  CBG monitoring, ED     Status: Abnormal   Collection Time: 11/04/2020  9:21 PM  Result Value Ref Range   Glucose-Capillary 363 (H) 70 - 99 mg/dL    Comment: Glucose reference range applies only to samples taken after fasting for at least 8 hours.  Lactic acid, plasma     Status: Abnormal   Collection Time: 10/21/2020  9:57 PM  Result Value Ref Range   Lactic Acid, Venous 7.2 (HH) 0.5 - 1.9 mmol/L    Comment: CRITICAL RESULT CALLED TO, READ BACK BY AND VERIFIED WITH: [facility] 2252 10/28/2020 COLEMAN,R Performed at [facility], [address] [place], [place] [address]   CBG monitoring, ED     Status: Abnormal   Collection Time: 11/16/2020 10:23 PM  Result Value Ref Range   Glucose-Capillary 307 (H) 70 - 99 mg/dL    Comment: Glucose reference range applies only to samples taken after fasting for at least 8 hours.  HIV Antibody (routine testing w rflx)     Status: None   Collection Time: 11/14/2020 11:02 PM  Result Value Ref Range   HIV Screen 4th Generation wRfx Non Reactive Non Reactive    Comment: Performed at [facility], [address] [place], [place] [address]  Basic metabolic panel     Status: Abnormal   Collection Time: 11/07/2020 11:11 PM  Result Value Ref Range   Sodium 136 135 - 145 mmol/L   Potassium 2.3 (LL) 3.5 - 5.1 mmol/L    Comment: CRITICAL RESULT CALLED TO, READ BACK BY AND VERIFIED WITH: THOMPSON,B 2351 10/30/2020 COLEMAN,R    Chloride 97 (L) 98 - 111 mmol/L   CO2 26 22 - 32 mmol/L   Glucose, Bld 251 (H) 70 - 99 mg/dL    Comment: Glucose reference range applies only to samples taken after fasting for at least 8 hours.   BUN 22 (H) 6 - 20 mg/dL   Creatinine, Ser 5.781.14 0.61 - 1.24 mg/dL   Calcium 7.8 (L) 8.9 - 10.3 mg/dL   GFR, Estimated >46>60 >96>60 mL/min    Comment: (NOTE) Calculated using the CKD-EPI Creatinine Equation (2021)    Anion gap 13 5 - 15    Comment: Performed at [facility], [address] [place], [place] [address]  CBG monitoring, ED     Status: Abnormal   Collection Time: 10/28/2020 11:53 PM  Result Value Ref Range   Glucose-Capillary 175 (H) 70 - 99 mg/dL    Comment: Glucose reference range applies only to samples taken after fasting for at least 8 hours.  CBG monitoring, ED     Status: Abnormal   Collection Time: 11/08/2020 12:54 AM  Result Value  Ref Range   Glucose-Capillary 184 (H) 70 - 99 mg/dL    Comment: Glucose reference range applies only to samples taken after fasting for at least 8 hours.  CBG monitoring, ED     Status: Abnormal   Collection Time: 11/07/2020  1:51 AM  Result Value Ref Range   Glucose-Capillary 177 (H) 70 - 99 mg/dL    Comment: Glucose reference range applies only to samples taken after fasting for at least 8 hours.  CBG monitoring, ED     Status: Abnormal   Collection Time: 11/07/2020  2:51 AM  Result Value Ref Range   Glucose-Capillary 150 (H) 70 - 99 mg/dL    Comment: Glucose reference range applies only to samples taken after fasting for at least 8 hours.  Basic metabolic panel     Status: Abnormal   Collection Time: 11/13/2020  3:19 AM  Result Value Ref Range   Sodium 138 135 - 145 mmol/L   Potassium 2.7 (LL) 3.5 - 5.1 mmol/L    Comment: CRITICAL RESULT CALLED TO, READ BACK BY AND VERIFIED WITH: WILLIAMS,T @ 0411 ON 11/18/2020 BY JUW    Chloride 101 98 - 111 mmol/L   CO2 28 22 - 32 mmol/L   Glucose, Bld 141 (H) 70 - 99 mg/dL    Comment: Glucose reference range applies only to samples taken after fasting for at least 8 hours.   BUN 22 (H) 6 - 20 mg/dL   Creatinine, Ser 1.61 0.61 - 1.24 mg/dL   Calcium 7.5 (L) 8.9 - 10.3 mg/dL   GFR, Estimated >09 >60 mL/min    Comment: (NOTE) Calculated using the CKD-EPI Creatinine Equation (2021)    Anion gap 9 5 - 15    Comment: Performed at [facility], [address] [place], [place] [address]  Lactic acid, plasma     Status: Abnormal   Collection Time: 11/07/2020  3:19 AM  Result Value Ref Range    Lactic Acid, Venous 2.6 (HH) 0.5 - 1.9 mmol/L    Comment: CRITICAL RESULT CALLED TO, READ BACK BY AND VERIFIED WITH: WILLIAMS,T @ 0410 ON 10/25/2020 BY JUW Performed at [facility], [address], [place], [place] [address]   Beta-hydroxybutyric acid     Status: None   Collection Time: 11/12/2020  3:19 AM  Result Value Ref Range   Beta-Hydroxybutyric Acid 0.06 0.05 - 0.27 mmol/L    Comment: Performed at [facility], [address] [place], [place] [address]  CBG monitoring, ED     Status: Abnormal   Collection Time: 10/29/2020  3:53 AM  Result Value Ref Range   Glucose-Capillary 147 (H) 70 - 99 mg/dL    Comment: Glucose reference range applies only to samples taken after fasting for at least 8 hours.  CBG monitoring, ED     Status: Abnormal   Collection Time: 10/22/2020  4:53 AM  Result Value Ref Range   Glucose-Capillary 153 (H) 70 - 99 mg/dL    Comment: Glucose reference range applies only to samples taken after fasting for at least 8 hours.  CBG monitoring, ED     Status: Abnormal   Collection Time: 10/21/2020 11:02 AM  Result Value Ref Range   Glucose-Capillary 249 (H) 70 - 99 mg/dL    Comment: Glucose reference range applies only to samples taken after fasting for at least 8 hours.  Basic metabolic panel     Status: Abnormal   Collection Time: 11/17/2020  1:15 PM  Result Value Ref Range  Sodium 133 (L) 135 - 145 mmol/L   Potassium 3.0 (L) 3.5 - 5.1 mmol/L   Chloride 102 98 - 111 mmol/L   CO2 24 22 - 32 mmol/L   Glucose, Bld 222 (H) 70 - 99 mg/dL    Comment: Glucose reference range applies only to samples taken after fasting for at least 8 hours.   BUN 21 (H) 6 - 20 mg/dL   Creatinine, Ser 1.61 0.61 - 1.24 mg/dL   Calcium 7.6 (L) 8.9 - 10.3 mg/dL   GFR, Estimated >09 >60 mL/min    Comment: (NOTE) Calculated using the CKD-EPI Creatinine Equation (2021)    Anion gap 7 5 - 15    Comment: Performed at [facility], [address] [place], [place] [address]  CBC     Status: Abnormal    Collection Time: 10/31/2020  3:26 PM  Result Value Ref Range   WBC 10.3 4.0 - 10.5 K/uL   RBC 2.80 (L) 4.22 - 5.81 MIL/uL   Hemoglobin 7.8 (L) 13.0 - 17.0 g/dL   HCT 81.1 (L) 91.4 - 78.2 %   MCV 90.0 80.0 - 100.0 fL   MCH 27.9 26.0 - 34.0 pg   MCHC 31.0 30.0 - 36.0 g/dL   RDW 95.6 21.3 - 08.6 %   Platelets 288 150 - 400 K/uL   nRBC 0.0 0.0 - 0.2 %    Comment: Performed at [facility], [address] [place], [place] [address]  Basic metabolic panel     Status: Abnormal   Collection Time: 11/09/2020  3:26 PM  Result Value Ref Range   Sodium 134 (L) 135 - 145 mmol/L   Potassium 2.9 (L) 3.5 - 5.1 mmol/L   Chloride 103 98 - 111 mmol/L   CO2 24 22 - 32 mmol/L   Glucose, Bld 264 (H) 70 - 99 mg/dL    Comment: Glucose reference range applies only to samples taken after fasting for at least 8 hours.   BUN 21 (H) 6 - 20 mg/dL   Creatinine, Ser 9.62 0.61 - 1.24 mg/dL   Calcium 7.5 (L) 8.9 - 10.3 mg/dL   GFR, Estimated >95 >28 mL/min    Comment: (NOTE) Calculated using the CKD-EPI Creatinine Equation (2021)    Anion gap 7 5 - 15    Comment: Performed at [facility], [address], [place], [place] [address]  CK     Status: Abnormal   Collection Time: 11/14/2020  3:26 PM  Result Value Ref Range   Total CK 9 (L) 49 - 397 U/L    Comment: Performed at [facility], [address], [place], [place] [address]  Glucose, capillary     Status: Abnormal   Collection Time: 11/16/2020  3:50 PM  Result Value Ref Range   Glucose-Capillary 255 (H) 70 - 99 mg/dL    Comment: Glucose reference range applies only to samples taken after fasting for at least 8 hours.   Comment 1 Notify RN    Comment 2 Document in Chart     DG Forearm Left  Result Date: 10/25/2020 CLINICAL DATA:  Infected wound to the distal left forearm. EXAM: LEFT FOREARM - 2 VIEW COMPARISON:  09/05/2020 FINDINGS: Intravenous catheter in the antecubital fossa region. Decreased  soft tissue swelling since previous study. No soft tissue gas. No evidence of acute fracture or dislocation. No focal bone lesion or bone destruction. Degenerative changes in the wrist. IMPRESSION: Decreased soft tissue swelling since previous study.  No acute bony abnormalities. Electronically Signed   By: Burman Nieves M.D.   On: 11/06/2020 20:09   CT WRIST LEFT WO CONTRAST  Result Date: 10-30-20 CLINICAL DATA:  Left wrist draining abscess.  Sepsis. EXAM: CT OF THE LEFT WRIST WITHOUT CONTRAST TECHNIQUE: Multidetector CT imaging was performed according to the standard protocol. Multiplanar CT image reconstructions were also generated. COMPARISON:  Plain films 10/21/2020 FINDINGS: There is soft tissue swelling noted along the anterior aspect of the wrist and distal forearm. Edema noted throughout the tissues without well-defined focal abscess. There are locules of gas within the soft tissues. Findings most suggestive of cellulitis. No acute bony abnormality. No bone destruction to suggest osteomyelitis. IMPRESSION: Edema and locules of gas throughout the subcutaneous soft tissues in the anterior distal forearm. No well-defined focal abscess. This could be further evaluated with ultrasound if felt clinically indicated. Electronically Signed   By: Charlett Nose M.D.   On: October 30, 2020 00:36   CT FEMUR RIGHT WO CONTRAST  Result Date: 10/21/2020 CLINICAL DATA:  Pain and swelling in right thigh. Soft tissue infection suspected. EXAM: CT OF THE LOWER RIGHT EXTREMITY WITHOUT CONTRAST TECHNIQUE: Multidetector CT imaging of the right lower extremity was performed according to the standard protocol. COMPARISON:  None. FINDINGS: Foley catheter present in the bladder. Large stool burden in the visualized rectum raising the possibility of fecal impaction. Recommend clinical correlation. Mild prostate enlargement with calcifications. Large soft tissue defect noted in the posteromedial upper right thigh. The defect extends  down to the muscles. Gas and fluid noted within the soft tissues along the surface of the hamstring muscles extending from the soft tissue defect in the proximal thigh distally to just above the knee concerning for abscess. It is difficult to determine if this is within the hamstring muscles or along the surface of the muscle. Largest collection of gas and fluid seen on axial image 187 of series 7 measures approximately 3.3 x 2.1 cm. There are some locules of gas which extend deep into the musculature (image 166, 154, 188) compatible with intramuscular abscesses. Extensive vascular calcifications. No acute bony abnormality. No fracture. No bone destruction to suggest osteomyelitis. IMPRESSION: Elongated gas and fluid collection noted along the surface of the hamstring muscles, in some areas extending deep into the hamstring muscles compatible with intramuscular abscess. This extends over an extensive area in length extending from the soft tissue defect in the proximal posteromedial right thigh distally to just above the knee. No evidence of osteomyelitis. Electronically Signed   By: Charlett Nose M.D.   On: 11/12/2020 20:25   DG Chest Portable 1 View  Result Date: 10-30-2020 CLINICAL DATA:  Dyspnea. EXAM: PORTABLE CHEST 1 VIEW COMPARISON:  10-30-20 FINDINGS: Right IJ central venous catheter unchanged. Lungs are adequately inflated without focal airspace consolidation, effusion or pneumothorax. Cardiomediastinal silhouette and remainder of the exam is unchanged. IMPRESSION: No active disease. Electronically Signed   By: Elberta Fortis M.D.   On: 10-30-20 14:05   DG Chest Portable 1 View  Result Date: 30-Oct-2020 CLINICAL DATA:  Central line placement EXAM: PORTABLE CHEST 1 VIEW COMPARISON:  Chest radiograph from one day prior. FINDINGS: Right internal jugular central venous catheter terminates at the cavoatrial junction. Stable cardiomediastinal silhouette with normal heart size. No pneumothorax. No pleural  effusion. Lungs appear clear, with no acute consolidative airspace disease and no pulmonary edema. Healed deformities in multiple posterior right mid ribs. IMPRESSION: Right internal jugular central venous catheter terminates at the cavoatrial junction. No pneumothorax.  No active cardiopulmonary disease. Electronically Signed   By: Delbert Phenix M.D.   On: 11/11/2020 06:31   DG Chest Port 1 View  Result Date: 10/21/2020 CLINICAL DATA:  60 year old male with weakness. EXAM: PORTABLE CHEST 1 VIEW COMPARISON:  Chest radiograph dated 08/18/2019 FINDINGS: Background of chronic interstitial coarsening and bronchitic changes. No focal consolidation, pleural effusion, or pneumothorax. The cardiac silhouette is within limits. No acute osseous pathology. IMPRESSION: No acute cardiopulmonary process. Electronically Signed   By: Elgie Collard M.D.   On: 10/28/2020 17:09    ROS Blood pressure 121/71, pulse (!) 103, temperature 97.8 F (36.6 C), temperature source Axillary, resp. rate (!) 27, height 5\' 5"  (1.651 m), weight 70 kg, SpO2 100 %. Physical Exam Constitutional:      Appearance: He is normal weight. He is ill-appearing and toxic-appearing.  HENT:     Head: Normocephalic.     Right Ear: External ear normal.     Left Ear: External ear normal.     Mouth/Throat:     Mouth: Mucous membranes are moist.  Eyes:     Extraocular Movements: Extraocular movements intact.  Cardiovascular:     Rate and Rhythm: Regular rhythm.  Pulmonary:     Effort: Pulmonary effort is normal.     Breath sounds: No wheezing.  Skin:    Capillary Refill: Capillary refill takes less than 2 seconds.   Patient was 5 cm round area packed with Kerlix foul-smelling drainage greater than 4 cm depth right proximal hamstring region.  Left wrist mid to distal forearm open area 1 cm with exposed palmaris longus tendon with purulent drainage expressed.  Patient able to extend and flex his fingers and states he has sensation to  fingertips.  Compartment on the left forearm is soft.  Assessment/Plan: Patient needs to go to the operating room for debridement of right posterior hamstring abscess with skin pressure breakdown over area where he   had previous hamstring tear several weeks ago.  This likely is infected hematoma with necrotic tissue present.  Plan debridement and VAC application.  He may need repetitive trips to the operating room.  Left forearm will require exploration debridement of necrotic tissue and VAC placement.  Procedure discussed patient decision for surgery emergent basis.  11/09/2020, 4:46 PM

## 2020-10-23 NOTE — Interval H&P Note (Signed)
History and Physical Interval Note:  2020-10-25 6:40 PM  Ronald Brady  has presented today for surgery, with the diagnosis of RIGHT POSTERIOR THIGH ABSCESS; LEFT VOLAR WRIST ABSCESS.  The various methods of treatment have been discussed with the patient and family. After consideration of risks, benefits and other options for treatment, the patient has consented to  Procedure(s): INCISION AND DRAINAGE LEFT WRIST AND RIGHT POSTERIOR THIGH (Left) as a surgical intervention.  The patient's history has been reviewed, patient examined, no change in status, stable for surgery.  I have reviewed the patient's chart and labs.  Questions were answered to the patient's satisfaction.     Eldred Manges

## 2020-10-23 NOTE — ED Notes (Signed)
Pt is alert and calm at this time. Pt given warm blankets and explained about his care being given. Pt denies any needs at this time. Waiting on LANTUS to give pt then will start the transition process. Will continue to monitor.

## 2020-10-23 NOTE — Progress Notes (Signed)
Pharmacy Antibiotic Note  Ronald Brady is a 60 y.o. male admitted on 11/11/2020 with cellulitis.  Pharmacy has been consulted for Merrem dosing. Renal function improved.  Plan: Change Merrem 1gm IV q8h Also on Clindamycin 600mg  IV q8h F/U cxs and clinical progress Monitor V/S, labs  Height: 5\' 5"  (165.1 cm) Weight: 68 kg (150 lb) IBW/kg (Calculated) : 61.5  Temp (24hrs), Avg:98 F (36.7 C), Min:95.7 F (35.4 C), Max:100 F (37.8 C)  Recent Labs  Lab 10/30/2020 1612 10/26/2020 1708 11/04/2020 1900 11/01/2020 2157 11/10/2020 2311 10/21/2020 0319  WBC 13.4*  --   --   --   --   --   CREATININE 1.92*  --   --   --  1.14 0.83  LATICACIDVEN  --  3.7* 3.8* 7.2*  --  2.6*    Estimated Creatinine Clearance: 84.4 mL/min (by C-G formula based on SCr of 0.83 mg/dL).    Allergies  Allergen Reactions  . Penicillins Other (See Comments)    Pt is unsure of reaction. States PCP told him he was allergic. Did it involve swelling of the face/tongue/throat, SOB, or low BP? Unknown Did it involve sudden or severe rash/hives, skin peeling, or any reaction on the inside of your mouth or nose? Unknown Did you need to seek medical attention at a hospital or doctor's office? Unknown When did it last happen?unk If all above answers are "NO", may proceed with cephalosporin use.     Antimicrobials this admission: merrem 4/2>>  Clindamycin 4/2>>  Dose adjustments this admission: 4/3  Microbiology results: 4/2 BCx: pending  Thank you for allowing pharmacy to be a part of this patient's care.  6/3, BS Pharm D, 6/2 Clinical Pharmacist Pager [phone] 10/22/2020 10:08 AM

## 2020-10-23 NOTE — ED Provider Notes (Signed)
Asked to see the patient by nursing staff as a rapid response.  Patient currently admitted to ICU at [facility].  He is awaiting a bed, being held in the emergency department at [facility].  Patient with significant soft tissue infection of the leg.  He has been persistently hypotensive despite receiving 7 L of IV fluid resuscitation.  Physical Exam  BP (!) 93/55   Pulse (!) 106   Temp 98.8 F (37.1 C) (Core (Comment))   Resp 18   Ht 5\' 5"  (1.651 m)   Wt 68 kg   SpO2 97%   BMI 24.96 kg/m   Physical Exam HENT:     Head: Normocephalic.  Eyes:     Pupils: Pupils are equal, round, and reactive to light.  Cardiovascular:     Rate and Rhythm: Tachycardia present.  Pulmonary:     Effort: Pulmonary effort is normal.  Abdominal:     Palpations: Abdomen is soft.  Musculoskeletal:        General: Normal range of motion.     Cervical back: Neck supple.  Skin:    General: Skin is warm and dry.  Neurological:     General: No focal deficit present.     Mental Status: He is alert.     ED Course/Procedures     .Central Line  Date/Time: 11/04/2020 5:58 AM Performed by: 12/23/2020, MD Authorized by: Gilda Crease, MD   Consent:    Consent obtained:  Verbal   Consent given by:  Patient   Risks, benefits, and alternatives were discussed: yes     Risks discussed:  Arterial puncture, incorrect placement, nerve damage, pneumothorax, infection and bleeding Pre-procedure details:    Indication(s): central venous access     Hand hygiene: Hand hygiene performed prior to insertion     Sterile barrier technique: All elements of maximal sterile technique followed     Skin preparation:  Chlorhexidine   Skin preparation agent: Skin preparation agent completely dried prior to procedure   Sedation:    Sedation type:  None Anesthesia:    Anesthesia method:  Local infiltration   Local anesthetic:  Lidocaine 1% w/o epi Procedure details:    Location:  R internal  jugular   Patient position:  Supine   Catheter size:  7 Fr   Landmarks identified: yes     Ultrasound guidance: yes     Ultrasound guidance timing: real time     Sterile ultrasound techniques: Sterile gel and sterile probe covers were used     Number of attempts:  1   Successful placement: yes   Post-procedure details:    Post-procedure:  Dressing applied and line sutured   Assessment:  Blood return through all ports, no pneumothorax on x-ray, placement verified by x-ray and free fluid flow   Procedure completion:  Tolerated well, no immediate complications .Critical Care Performed by: Gilda Crease, MD Authorized by: Gilda Crease, MD   Critical care provider statement:    Critical care time (minutes):  30   Critical care time was exclusive of:  Separately billable procedures and treating other patients   Critical care was necessary to treat or prevent imminent or life-threatening deterioration of the following conditions:  Circulatory failure and sepsis   Critical care was time spent personally by me on the following activities:  Ordering and performing treatments and interventions, discussions with consultants, ordering and review of radiographic studies, examination of patient, review of old charts, re-evaluation of  patient's condition, pulse oximetry and evaluation of patient's response to treatment   I assumed direction of critical care for this patient from another provider in my specialty: yes     Care discussed with: admitting provider and accepting provider at another facility      MDM  I did contact Dr. Nicole Cella who is the accepting physician in the intensive care unit at [facility].  He did agree that the patient would require initiation of pressors.  We also further discussed the patient's infection and need for transfer.  It is not felt that he needs urgent transfer, ED to ED at this time.  We will continue to wait for bed placement.  A central line was  placed by myself without difficulty and he was initiated on Levophed.  We will continue gentle fluid hydration and continue replacing potassium.      Gilda Crease, MD 11/03/2020 [phone]

## 2020-10-23 NOTE — Anesthesia Preprocedure Evaluation (Addendum)
Anesthesia Evaluation  Patient identified by MRN, date of birth, ID band Patient awake    Reviewed: Allergy & Precautions, NPO status , Patient's Chart, lab work & pertinent test results, reviewed documented beta blocker date and time   Airway Mallampati: I  TM Distance: >3 FB Neck ROM: Full    Dental  (+) Edentulous Upper, Edentulous Lower   Pulmonary COPD,  COPD inhaler, Current Smoker and Patient abstained from smoking.,  Currently on BiPAP   Pulmonary exam normal + rhonchi  + decreased breath sounds      Cardiovascular Exercise Tolerance: Poor hypertension, Pt. on medications  Rhythm:Regular Rate:Tachycardia     Neuro/Psych negative neurological ROS  negative psych ROS   GI/Hepatic negative GI ROS, Neg liver ROS,   Endo/Other  diabetes, Poorly Controlled, Type 2, Oral Hypoglycemic AgentsHyperosmolar state  Renal/GU Renal diseaseAKI  negative genitourinary   Musculoskeletal Abscess right thigh and left wrist Hx/o torn hamstring 3 weeks ago   Abdominal   Peds  Hematology  (+) anemia , Severe sepsis    Anesthesia Other Findings   Reproductive/Obstetrics                            Anesthesia Physical Anesthesia Plan  ASA: III and emergent  Anesthesia Plan: General   Post-op Pain Management:    Induction: Intravenous  PONV Risk Score and Plan: 2 and Treatment may vary due to age or medical condition and Ondansetron  Airway Management Planned: Oral ETT  Additional Equipment:   Intra-op Plan:   Post-operative Plan: Possible Post-op intubation/ventilation  Informed Consent: I have reviewed the patients History and Physical, chart, labs and discussed the procedure including the risks, benefits and alternatives for the proposed anesthesia with the patient or authorized representative who has indicated his/her understanding and acceptance.     Dental advisory given  Plan  Discussed with: CRNA and Anesthesiologist  Anesthesia Plan Comments:         Anesthesia Quick Evaluation

## 2020-10-23 NOTE — Op Note (Addendum)
Preop diagnosis: Diabetic with right hamstring draining abscess, left volar forearm subcutaneous abscess, draining.  Postop diagnosis: Same  Procedure:1. Sharp excisional debridement of right posterior thigh abscess with excisional debridement of skin, subcutaneous tissue, muscle, tendon, VAC placement.  2.  Sharp excisional debridement left volar forearm subcutaneous abscess with sharp excisional debridement of skin subcutaneous tissue and tendon.  VAC placement.  Surgeon: Ophelia Charter MD  Anesthesia General orotracheal.  Tourniquet left proximal arm tourniquet 250 x 22 minutes.  Brief history 60 year old male diabetic with a hamstring tear more than 3 to 4 weeks ago spent 4 days in the chair with pain was found by his sister in the same chair for 4 days not taking his insulin not eating not drinking covered in feces and urine with glucose greater than 900 and high lactic levels.  He had an history of a hamstring tear on the right and had been sitting in the chair with increased hamstring pain and inability to extend his knee and likely had abscess develop.  There was subtendinous tissue in the forearm seen on CT scan as well as air and some debridement was performed emergency room by Dr. Denton Lank of both areas and he was transferred from [facility] to [place] [facility] and has been in intensive care where he was admitted today.  Nurse had packed the wound in the thigh.  CT scan showed progression down almost to the knee with soft tissue and fluid.  Debridement type: Excisional Debridement  Side: bilaterally  Body Location: see above   Tools used for debridement: scalpel, scissors and rongeur  Pre-debridement Wound size (cm):   Length: 5        Width: 1cm  Depth: 8cm  Post-debridement Wound size (cm):   Length: "        Width: "     Depth: "   Debridement depth beyond dead/damaged tissue down to healthy viable tissue: no   Tissue layer involved: skin, subcutaneous tissue, muscle / fascia  Nature of  tissue removed: Slough, Necrotic, Devitalized Tissue, Non-viable tissue and Purulence  Irrigation volume: 3liters     Irrigation fluid type: Normal Saline         Procedure: After induction of general anesthesia patient is already on double antibiotics by the hospital intensivist and patient was placed prone on chest rolls.  Patient had hamstring contracture and lack 30 degrees even under general anesthesia extending right knee.  There was transverse opening 4 cm 3 fingerbreadths below the gluteal fold in the proximal medial hamstring and copious purulent drainage was present with a squeeze of the posterior compartment of the thigh.  Area was prepped with Betadine and squared as well as the arm was prepped after a tourniquet was applied to the left upper arm timeout procedure was completed.  Wound was opened on the thigh extended 1 to 2 cm skin was sharply cut back which was necrotic there is subcutaneous necrosis necrosis of hamstrings and aerobic anaerobic cultures were obtained.  Subcu was used that extended two thirds of the way down to the knee and copious purulent material was sucked out.  Pulse irrigator was used and repeated this process with sharp excisional debridement with pickups 15 blade Mayo scissors scalpel and [place] and bare rondure removing necrotic tendon fascia subtenons tissue and muscle.  Once the sucker down in the thigh could no longer produced purulent material sponge clean was placed in the wound attention was turned to the hand.  Tourniquet was inflated and patient had  an area directly over the palmaris longus where the palmaris longus had a piece tendon sticking out through an 8 mm opening in the skin with piece of the palmaris longus tendon this is pulled cut there is a second area more proximal at the mid forearm this was opened 1.5 cm palmaris longus was identified pulled cut excised.  Sharp excisional debridement then was used of the distal skin area excising skin  subtendinous tissue and excising the palmaris longus tendon.  Volar forearm fascia was soft and patient had been able to flex and extend his fingers although he did have some stiffness in his fingers he states he had feeling in his fingers and might of had compartment syndrome at some point several days ago but his compartment was not tight at this point and the volar compartment was not open.  Sac was used subcutaneous tissue debriding purulent material that was around the area of the palmaris longus.  Pulse lavage was used.  The proximal area the been open to excise a palmaris longus more proximal was reapproximated with 2-0 Vicryl suture x2.  Small wound VAC was then placed distally hooked up to suction with good seal followed by 4 x 4's 20 nurse and Ace wrap.  Attention was then turned back to the thigh and after repeat irrigation again using the pulse lavage small VAC was placed using the black sponge with apparent good seal.  There were no VAC machines in [place] [facility] and we are waiting on 2 VAC machines to come from [facility] campus to make sure that there is a low leak rate on both areas.  Patient tolerated procedure well will be transferred to care room once the [facility] machines arrive.

## 2020-10-23 NOTE — Anesthesia Procedure Notes (Signed)
Procedure Name: Intubation Date/Time: 10/31/2020 6:50 PM Performed by: Zhanae Proffit D, CRNA Pre-anesthesia Checklist: Patient identified, Emergency Drugs available, Suction available and Patient being monitored Patient Re-evaluated:Patient Re-evaluated prior to induction Oxygen Delivery Method: Circle system utilized Preoxygenation: Pre-oxygenation with 100% oxygen Induction Type: IV induction Ventilation: Mask ventilation without difficulty Laryngoscope Size: Mac and 4 Tube type: Oral Tube size: 7.5 mm Number of attempts: 1 Airway Equipment and Method: Stylet Placement Confirmation: ETT inserted through vocal cords under direct vision,  positive ETCO2 and breath sounds checked- equal and bilateral Secured at: 22 cm Tube secured with: Tape Dental Injury: Teeth and Oropharynx as per pre-operative assessment

## 2020-10-23 NOTE — ED Notes (Signed)
Check on BG was delayed due to pt being in CT

## 2020-10-23 NOTE — Progress Notes (Signed)
PHARMACY - PHYSICIAN COMMUNICATION CRITICAL VALUE ALERT - BLOOD CULTURE IDENTIFICATION (BCID)  Ronald Brady is an 60 y.o. male who presented to [facility] on 10/25/2020 with a chief complaint of unable to get up with multiple skin abscesses. Patient is currently on broad-spectrum antibiotics including meropenem and clindamycin. Micro lab called with [facility] in 3/4 bottles and BCID detecting MSSA.   Assessment: If appropriate to narrow at this point, would consider cefazolin. Patient has a PCN allergy but appears to tolerate cephalosporins. Meropenem will cover the MSSA if provider desires to continue broad-spectrum coverage for now.   Name of physician (or Provider) Contacted: Dr. Sherene Sires  Current antibiotics: meropenem   Changes to prescribed antibiotics recommended:  None, provider suspects polymicrobial infection.   Results for orders placed or performed during the hospital encounter of 11/13/2020  Blood Culture ID Panel (Reflexed) (Collected: 11/08/2020  4:13 PM)  Result Value Ref Range   Enterococcus faecalis NOT DETECTED NOT DETECTED   Enterococcus Faecium NOT DETECTED NOT DETECTED   Listeria monocytogenes NOT DETECTED NOT DETECTED   Staphylococcus species DETECTED (A) NOT DETECTED   Staphylococcus aureus (BCID) DETECTED (A) NOT DETECTED   Staphylococcus epidermidis NOT DETECTED NOT DETECTED   Staphylococcus lugdunensis NOT DETECTED NOT DETECTED   Streptococcus species NOT DETECTED NOT DETECTED   Streptococcus agalactiae NOT DETECTED NOT DETECTED   Streptococcus pneumoniae NOT DETECTED NOT DETECTED   Streptococcus pyogenes NOT DETECTED NOT DETECTED   A.calcoaceticus-baumannii NOT DETECTED NOT DETECTED   Bacteroides fragilis NOT DETECTED NOT DETECTED   Enterobacterales NOT DETECTED NOT DETECTED   Enterobacter cloacae complex NOT DETECTED NOT DETECTED   Escherichia coli NOT DETECTED NOT DETECTED   Klebsiella aerogenes NOT DETECTED NOT DETECTED   Klebsiella oxytoca NOT DETECTED NOT  DETECTED   Klebsiella pneumoniae NOT DETECTED NOT DETECTED   Proteus species NOT DETECTED NOT DETECTED   Salmonella species NOT DETECTED NOT DETECTED   Serratia marcescens NOT DETECTED NOT DETECTED   Haemophilus influenzae NOT DETECTED NOT DETECTED   Neisseria meningitidis NOT DETECTED NOT DETECTED   Pseudomonas aeruginosa NOT DETECTED NOT DETECTED   Stenotrophomonas maltophilia NOT DETECTED NOT DETECTED   Candida albicans NOT DETECTED NOT DETECTED   Candida auris NOT DETECTED NOT DETECTED   Candida glabrata NOT DETECTED NOT DETECTED   Candida krusei NOT DETECTED NOT DETECTED   Candida parapsilosis NOT DETECTED NOT DETECTED   Candida tropicalis NOT DETECTED NOT DETECTED   Cryptococcus neoformans/gattii NOT DETECTED NOT DETECTED   Meth resistant mecA/C and MREJ NOT DETECTED NOT DETECTED    Valentina Gu 10/22/2020  3:13 PM

## 2020-10-23 NOTE — ED Notes (Signed)
Offered to put pt on bedpan he said no his hamstring hurt to bad. Rn talked to pt stated to come back later

## 2020-10-23 NOTE — ED Notes (Addendum)
Pt pressure continues to be low. EDP made aware and placed cental line. Orders will be given soon. Pt is alert. Will continue to monitor

## 2020-10-23 NOTE — H&P (View-Only) (Signed)
 Reason for Consult:abscess right hamstring and left volar forearm with exposed tendons Referring Physician: Michael Wert MD  Ronald Brady is an 60 y.o. male.  HPI: 60-year-old male with diabetes was seen by orthopedic PA on 09/29/2018 with a hamstring tear.  He was found by his sister yesterday at his home in a chair and not been out of the chair for 4 days found in stool and urine hypothermic, glucose 900+ dehydrated, septic.  Patient had open area right hamstring proximally with 4 cm deep foul smelling drainage wound.  Also an area over the left volar wrist and distal forearm with exposed tendon likely palmaris longus also with foul-smelling drainage.  Past Medical History:  Diagnosis Date  . Diabetes mellitus without complication (HCC)   . Emphysema lung (HCC)   . Hypertension     Past Surgical History:  Procedure Laterality Date  . ABDOMINAL SURGERY      No family history on file.  Social History:  reports that he has been smoking. He has been smoking about 1.50 packs per day. He has never used smokeless tobacco. He reports that he does not drink alcohol and does not use drugs.  Allergies:  Allergies  Allergen Reactions  . Codeine Nausea And Vomiting  . Penicillins Other (See Comments)    Pt is unsure of reaction. States PCP told him he was allergic. Did it involve swelling of the face/tongue/throat, SOB, or low BP? Unknown Did it involve sudden or severe rash/hives, skin peeling, or any reaction on the inside of your mouth or nose? Unknown Did you need to seek medical attention at a hospital or doctor's office? Unknown When did it last happen?unk If all above answers are "NO", may proceed with cephalosporin use.     Medications: I have reviewed the patient's current medications.  Results for orders placed or performed during the hospital encounter of 11/02/2020 (from the past 48 hour(s))  CBC     Status: Abnormal   Collection Time: 11/03/2020  4:12 PM  Result  Value Ref Range   WBC 13.4 (H) 4.0 - 10.5 K/uL   RBC 3.72 (L) 4.22 - 5.81 MIL/uL   Hemoglobin 10.3 (L) 13.0 - 17.0 g/dL   HCT 34.2 (L) 39.0 - 52.0 %   MCV 91.9 80.0 - 100.0 fL   MCH 27.7 26.0 - 34.0 pg   MCHC 30.1 30.0 - 36.0 g/dL   RDW 15.5 11.5 - 15.5 %   Platelets 438 (H) 150 - 400 K/uL   nRBC 0.0 0.0 - 0.2 %    Comment: Performed at [facility], [address] [place], [place] [address]  Comprehensive metabolic panel     Status: Abnormal   Collection Time: 10/25/2020  4:12 PM  Result Value Ref Range   Sodium 129 (L) 135 - 145 mmol/L   Potassium 3.5 3.5 - 5.1 mmol/L   Chloride 82 (L) 98 - 111 mmol/L   CO2 27 22 - 32 mmol/L   Glucose, Bld 980 (HH) 70 - 99 mg/dL    Comment: Glucose reference range applies only to samples taken after fasting for at least 8 hours. CRITICAL RESULT CALLED TO, READ BACK BY AND VERIFIED WITH: GANTT,E 1722 11/04/2020 COLEMAN,R    BUN 25 (H) 6 - 20 mg/dL   Creatinine, Ser 1.92 (H) 0.61 - 1.24 mg/dL   Calcium 8.3 (L) 8.9 - 10.3 mg/dL   Total Protein 7.1 6.5 - 8.1 g/dL   Albumin 2.1 (L) 3.5 - 5.0 g/dL     AST 14 (L) 15 - 41 U/L   ALT 13 0 - 44 U/L   Alkaline Phosphatase 91 38 - 126 U/L   Total Bilirubin 1.1 0.3 - 1.2 mg/dL   GFR, Estimated 40 (L) >60 mL/min    Comment: (NOTE) Calculated using the CKD-EPI Creatinine Equation (2021)    Anion gap 20 (H) 5 - 15    Comment: Performed at [facility], [address] [place], [place] [address]  Blood culture (routine x 2)     Status: None (Preliminary result)   Collection Time: 11/15/2020  4:12 PM   Specimen: Right Antecubital; Blood  Result Value Ref Range   Specimen Description      RIGHT ANTECUBITAL BOTTLES DRAWN AEROBIC AND ANAEROBIC Performed at [facility], [address] [place], [place] [address]    Special Requests      Blood Culture results may not be optimal due to an inadequate volume of blood received in culture bottles Performed at [facility], [address] [place], [place] [address]     Culture  Setup Time      GRAM POSITIVE COCCI Aerobic bottle Gram Stain Report Called to,Read Back By and Verified With: CRAWFORD,H@1015 BY MATTHEWS, B 4.3.22 CRITICAL VALUE NOTED.  VALUE IS CONSISTENT WITH PREVIOUSLY REPORTED AND CALLED VALUE. Performed at [facility], [address] [place], [place] [address]    Culture GRAM POSITIVE COCCI    Report Status PENDING   CK     Status: Abnormal   Collection Time: 11/03/2020  4:12 PM  Result Value Ref Range   Total CK 14 (L) 49 - 397 U/L    Comment: Performed at [facility], [address] [place], [place] [address]  Beta-hydroxybutyric acid     Status: Abnormal   Collection Time: 11/13/2020  4:12 PM  Result Value Ref Range   Beta-Hydroxybutyric Acid 3.20 (H) 0.05 - 0.27 mmol/L    Comment: Performed at [facility], [address] [place], [place] [address]  Blood culture (routine x 2)     Status: None (Preliminary result)   Collection Time: 10/25/2020  4:13 PM   Specimen: Left Antecubital; Blood  Result Value Ref Range   Specimen Description      LEFT ANTECUBITAL BOTTLES DRAWN AEROBIC AND ANAEROBIC Performed at [facility], [address] [place], [place] [address]    Special Requests      Blood Culture adequate volume Performed at [facility], [address] [place], [place] [address]    Culture  Setup Time      GRAM POSITIVE COCCI AEROBIC AND ANAEROBIC BOTTLE Gram Stain Report Called to,Read Back By and Verified With: CRAWFORD,H@1015 BY MATTHEWS,B 4.3.22 Organism ID to follow CRITICAL RESULT CALLED TO, READ BACK BY AND VERIFIED WITH: S CHRISTY PHARMD @1453 11/13/2020 EB Performed at [facility], [address] [place], [place] [address]    Culture GRAM POSITIVE COCCI    Report Status PENDING   Blood Culture ID Panel (Reflexed)     Status: Abnormal   Collection Time: 11/12/2020  4:13 PM  Result Value Ref Range   Enterococcus faecalis NOT DETECTED NOT DETECTED   Enterococcus Faecium NOT DETECTED NOT DETECTED    Listeria monocytogenes NOT DETECTED NOT DETECTED   Staphylococcus species DETECTED (A) NOT DETECTED    Comment: CRITICAL RESULT CALLED TO, READ BACK BY AND VERIFIED WITH: S CHRISTY PHARMD @1453 11/09/2020 EB    Staphylococcus aureus (BCID) DETECTED (A) NOT DETECTED    Comment:   CRITICAL RESULT CALLED TO, READ BACK BY AND VERIFIED WITH: S CHRISTY PHARMD @1453 10/22/2020 EB    Staphylococcus epidermidis NOT DETECTED NOT DETECTED   Staphylococcus lugdunensis NOT DETECTED NOT DETECTED   Streptococcus species NOT DETECTED NOT DETECTED   Streptococcus agalactiae NOT DETECTED NOT DETECTED   Streptococcus pneumoniae NOT DETECTED NOT DETECTED   Streptococcus pyogenes NOT DETECTED NOT DETECTED   A.calcoaceticus-baumannii NOT DETECTED NOT DETECTED   Bacteroides fragilis NOT DETECTED NOT DETECTED   Enterobacterales NOT DETECTED NOT DETECTED   Enterobacter cloacae complex NOT DETECTED NOT DETECTED   Escherichia coli NOT DETECTED NOT DETECTED   Klebsiella aerogenes NOT DETECTED NOT DETECTED   Klebsiella oxytoca NOT DETECTED NOT DETECTED   Klebsiella pneumoniae NOT DETECTED NOT DETECTED   Proteus species NOT DETECTED NOT DETECTED   Salmonella species NOT DETECTED NOT DETECTED   Serratia marcescens NOT DETECTED NOT DETECTED   Haemophilus influenzae NOT DETECTED NOT DETECTED   Neisseria meningitidis NOT DETECTED NOT DETECTED   Pseudomonas aeruginosa NOT DETECTED NOT DETECTED   Stenotrophomonas maltophilia NOT DETECTED NOT DETECTED   Candida albicans NOT DETECTED NOT DETECTED   Candida auris NOT DETECTED NOT DETECTED   Candida glabrata NOT DETECTED NOT DETECTED   Candida krusei NOT DETECTED NOT DETECTED   Candida parapsilosis NOT DETECTED NOT DETECTED   Candida tropicalis NOT DETECTED NOT DETECTED   Cryptococcus neoformans/gattii NOT DETECTED NOT DETECTED   Meth resistant mecA/C and MREJ NOT DETECTED NOT DETECTED    Comment: Performed at [facility], [address] [place], [place] [address]   Lactic acid, plasma     Status: Abnormal   Collection Time: 11/12/2020  5:08 PM  Result Value Ref Range   Lactic Acid, Venous 3.7 (HH) 0.5 - 1.9 mmol/L    Comment: CRITICAL RESULT CALLED TO, READ BACK BY AND VERIFIED WITH: GANTT,E 1738 11/02/2020 COLEMAN,R Performed at [facility], [address] [place], [place] [address]   CBG monitoring, ED     Status: Abnormal   Collection Time: 11/04/2020  5:55 PM  Result Value Ref Range   Glucose-Capillary >600 (HH) 70 - 99 mg/dL    Comment: Glucose reference range applies only to samples taken after fasting for at least 8 hours.  Urinalysis, Routine w reflex microscopic Urine, Catheterized     Status: Abnormal   Collection Time: 10/26/2020  6:15 PM  Result Value Ref Range   Color, Urine YELLOW YELLOW   APPearance CLOUDY (A) CLEAR   Specific Gravity, Urine 1.018 1.005 - 1.030   pH 5.0 5.0 - 8.0   Glucose, UA >=500 (A) NEGATIVE mg/dL   Hgb urine dipstick SMALL (A) NEGATIVE   Bilirubin Urine NEGATIVE NEGATIVE   Ketones, ur 5 (A) NEGATIVE mg/dL   Protein, ur 30 (A) NEGATIVE mg/dL   Nitrite NEGATIVE NEGATIVE   Leukocytes,Ua MODERATE (A) NEGATIVE   RBC / HPF >50 (H) 0 - 5 RBC/hpf   WBC, UA >50 (H) 0 - 5 WBC/hpf   Bacteria, UA RARE (A) NONE SEEN   Squamous Epithelial / LPF 0-5 0 - 5   WBC Clumps PRESENT    Mucus PRESENT    Budding Yeast PRESENT    Hyaline Casts, UA PRESENT    Ca Oxalate Crys, UA PRESENT     Comment: Performed at [facility], [address] [place], [place] [address]  Resp Panel by RT-PCR (Flu A&B, Covid) Nasopharyngeal Swab     Status: None   Collection Time: 11/13/2020  6:16 PM     Specimen: Nasopharyngeal Swab; Nasopharyngeal(NP) swabs in vial transport medium  Result Value Ref Range   SARS Coronavirus 2 by RT PCR NEGATIVE NEGATIVE    Comment: (NOTE) SARS-CoV-2 target nucleic acids are NOT DETECTED.  The SARS-CoV-2 RNA is generally detectable in upper respiratory specimens during the acute phase of infection. The  lowest concentration of SARS-CoV-2 viral copies this assay can detect is 138 copies/mL. A negative result does not preclude SARS-Cov-2 infection and should not be used as the sole basis for treatment or other patient management decisions. A negative result may occur with  improper specimen collection/handling, submission of specimen other than nasopharyngeal swab, presence of viral mutation(s) within the areas targeted by this assay, and inadequate number of viral copies(<138 copies/mL). A negative result must be combined with clinical observations, patient history, and epidemiological information. The expected result is Negative.  Fact Sheet for Patients:  https://www.fda.gov/media/152166/download  Fact Sheet for Healthcare Providers:  https://www.fda.gov/media/152162/download  This test is no t yet approved or cleared by the [place] FDA and  has been authorized for detection and/or diagnosis of SARS-CoV-2 by FDA under an Emergency Use Authorization (EUA). This EUA will remain  in effect (meaning this test can be used) for the duration of the COVID-19 declaration under Section 564(b)(1) of the Act, 21 U.S.C.section 360bbb-3(b)(1), unless the authorization is terminated  or revoked sooner.       Influenza A by PCR NEGATIVE NEGATIVE   Influenza B by PCR NEGATIVE NEGATIVE    Comment: (NOTE) The Xpert Xpress SARS-CoV-2/FLU/RSV plus assay is intended as an aid in the diagnosis of influenza from Nasopharyngeal swab specimens and should not be used as a sole basis for treatment. Nasal washings and aspirates are unacceptable for Xpert Xpress SARS-CoV-2/FLU/RSV testing.  Fact Sheet for Patients: https://www.fda.gov/media/152166/download  Fact Sheet for Healthcare Providers: https://www.fda.gov/media/152162/download  This test is not yet approved or cleared by the [place] FDA and has been authorized for detection and/or diagnosis of SARS-CoV-2 by FDA under an Emergency  Use Authorization (EUA). This EUA will remain in effect (meaning this test can be used) for the duration of the COVID-19 declaration under Section 564(b)(1) of the Act, 21 U.S.C. section 360bbb-3(b)(1), unless the authorization is terminated or revoked.  Performed at [facility], [address] [place], [place] [address]   CBG monitoring, ED     Status: Abnormal   Collection Time: 10/25/2020  6:31 PM  Result Value Ref Range   Glucose-Capillary >600 (HH) 70 - 99 mg/dL    Comment: Glucose reference range applies only to samples taken after fasting for at least 8 hours.  CBG monitoring, ED     Status: Abnormal   Collection Time: 11/19/2020  6:58 PM  Result Value Ref Range   Glucose-Capillary >600 (HH) 70 - 99 mg/dL    Comment: Glucose reference range applies only to samples taken after fasting for at least 8 hours.  Lactic acid, plasma     Status: Abnormal   Collection Time: 10/21/2020  7:00 PM  Result Value Ref Range   Lactic Acid, Venous 3.8 (HH) 0.5 - 1.9 mmol/L    Comment: CRITICAL RESULT CALLED TO, READ BACK BY AND VERIFIED WITH: GOWENS,T 1937 11/09/2020 COLEMAN,R Performed at [facility], [address] [place], [place] [address]   CBG monitoring, ED     Status: Abnormal   Collection Time: 11/04/2020  7:47 PM  Result Value Ref Range   Glucose-Capillary 545 (HH) 70 - 99 mg/dL    Comment: Glucose reference range   applies only to samples taken after fasting for at least 8 hours.   Comment 1 Notify RN    Comment 2 Document in Chart   CBG monitoring, ED     Status: Abnormal   Collection Time: 11/06/2020  8:20 PM  Result Value Ref Range   Glucose-Capillary 506 (HH) 70 - 99 mg/dL    Comment: Glucose reference range applies only to samples taken after fasting for at least 8 hours.   Comment 1 Notify RN   CBG monitoring, ED     Status: Abnormal   Collection Time: 11/02/2020  8:49 PM  Result Value Ref Range   Glucose-Capillary 432 (H) 70 - 99 mg/dL    Comment: Glucose reference range applies  only to samples taken after fasting for at least 8 hours.  CBG monitoring, ED     Status: Abnormal   Collection Time: 11/02/2020  9:21 PM  Result Value Ref Range   Glucose-Capillary 363 (H) 70 - 99 mg/dL    Comment: Glucose reference range applies only to samples taken after fasting for at least 8 hours.  Lactic acid, plasma     Status: Abnormal   Collection Time: 11/04/2020  9:57 PM  Result Value Ref Range   Lactic Acid, Venous 7.2 (HH) 0.5 - 1.9 mmol/L    Comment: CRITICAL RESULT CALLED TO, READ BACK BY AND VERIFIED WITH: THOMPSON,R 2252 10/29/2020 COLEMAN,R Performed at [facility], [address] [place], [place] [address]   CBG monitoring, ED     Status: Abnormal   Collection Time: 10/26/2020 10:23 PM  Result Value Ref Range   Glucose-Capillary 307 (H) 70 - 99 mg/dL    Comment: Glucose reference range applies only to samples taken after fasting for at least 8 hours.  HIV Antibody (routine testing w rflx)     Status: None   Collection Time: 10/30/2020 11:02 PM  Result Value Ref Range   HIV Screen 4th Generation wRfx Non Reactive Non Reactive    Comment: Performed at [facility], [address] [place], [place] [address]  Basic metabolic panel     Status: Abnormal   Collection Time: 11/18/2020 11:11 PM  Result Value Ref Range   Sodium 136 135 - 145 mmol/L   Potassium 2.3 (LL) 3.5 - 5.1 mmol/L    Comment: CRITICAL RESULT CALLED TO, READ BACK BY AND VERIFIED WITH: THOMPSON,B 2351 11/13/2020 COLEMAN,R    Chloride 97 (L) 98 - 111 mmol/L   CO2 26 22 - 32 mmol/L   Glucose, Bld 251 (H) 70 - 99 mg/dL    Comment: Glucose reference range applies only to samples taken after fasting for at least 8 hours.   BUN 22 (H) 6 - 20 mg/dL   Creatinine, Ser 1.14 0.61 - 1.24 mg/dL   Calcium 7.8 (L) 8.9 - 10.3 mg/dL   GFR, Estimated >60 >60 mL/min    Comment: (NOTE) Calculated using the CKD-EPI Creatinine Equation (2021)    Anion gap 13 5 - 15    Comment: Performed at [facility], [address] [place], [place] [address]  CBG monitoring, ED     Status: Abnormal   Collection Time: 10/28/2020 11:53 PM  Result Value Ref Range   Glucose-Capillary 175 (H) 70 - 99 mg/dL    Comment: Glucose reference range applies only to samples taken after fasting for at least 8 hours.  CBG monitoring, ED     Status: Abnormal   Collection Time: 11/01/2020 12:54 AM  Result Value   Ref Range   Glucose-Capillary 184 (H) 70 - 99 mg/dL    Comment: Glucose reference range applies only to samples taken after fasting for at least 8 hours.  CBG monitoring, ED     Status: Abnormal   Collection Time: 11/13/2020  1:51 AM  Result Value Ref Range   Glucose-Capillary 177 (H) 70 - 99 mg/dL    Comment: Glucose reference range applies only to samples taken after fasting for at least 8 hours.  CBG monitoring, ED     Status: Abnormal   Collection Time: 10/26/2020  2:51 AM  Result Value Ref Range   Glucose-Capillary 150 (H) 70 - 99 mg/dL    Comment: Glucose reference range applies only to samples taken after fasting for at least 8 hours.  Basic metabolic panel     Status: Abnormal   Collection Time: 10/29/2020  3:19 AM  Result Value Ref Range   Sodium 138 135 - 145 mmol/L   Potassium 2.7 (LL) 3.5 - 5.1 mmol/L    Comment: CRITICAL RESULT CALLED TO, READ BACK BY AND VERIFIED WITH: WILLIAMS,T @ 0411 ON 11/10/2020 BY JUW    Chloride 101 98 - 111 mmol/L   CO2 28 22 - 32 mmol/L   Glucose, Bld 141 (H) 70 - 99 mg/dL    Comment: Glucose reference range applies only to samples taken after fasting for at least 8 hours.   BUN 22 (H) 6 - 20 mg/dL   Creatinine, Ser 0.83 0.61 - 1.24 mg/dL   Calcium 7.5 (L) 8.9 - 10.3 mg/dL   GFR, Estimated >60 >60 mL/min    Comment: (NOTE) Calculated using the CKD-EPI Creatinine Equation (2021)    Anion gap 9 5 - 15    Comment: Performed at [facility], [address] [place], [place] [address]  Lactic acid, plasma     Status: Abnormal   Collection Time: 11/04/2020  3:19 AM  Result Value Ref Range    Lactic Acid, Venous 2.6 (HH) 0.5 - 1.9 mmol/L    Comment: CRITICAL RESULT CALLED TO, READ BACK BY AND VERIFIED WITH: WILLIAMS,T @ 0410 ON 10/27/2020 BY JUW Performed at [facility], [address] [place], [place] [address]   Beta-hydroxybutyric acid     Status: None   Collection Time: 11/09/2020  3:19 AM  Result Value Ref Range   Beta-Hydroxybutyric Acid 0.06 0.05 - 0.27 mmol/L    Comment: Performed at [facility], [address] [place],  [address]  CBG monitoring, ED     Status: Abnormal   Collection Time: 11/04/2020  3:53 AM  Result Value Ref Range   Glucose-Capillary 147 (H) 70 - 99 mg/dL    Comment: Glucose reference range applies only to samples taken after fasting for at least 8 hours.  CBG monitoring, ED     Status: Abnormal   Collection Time: 10/29/2020  4:53 AM  Result Value Ref Range   Glucose-Capillary 153 (H) 70 - 99 mg/dL    Comment: Glucose reference range applies only to samples taken after fasting for at least 8 hours.  CBG monitoring, ED     Status: Abnormal   Collection Time: 11/08/2020 11:02 AM  Result Value Ref Range   Glucose-Capillary 249 (H) 70 - 99 mg/dL    Comment: Glucose reference range applies only to samples taken after fasting for at least 8 hours.  Basic metabolic panel     Status: Abnormal   Collection Time: 11/17/2020  1:15 PM  Result Value Ref Range     Sodium 133 (L) 135 - 145 mmol/L   Potassium 3.0 (L) 3.5 - 5.1 mmol/L   Chloride 102 98 - 111 mmol/L   CO2 24 22 - 32 mmol/L   Glucose, Bld 222 (H) 70 - 99 mg/dL    Comment: Glucose reference range applies only to samples taken after fasting for at least 8 hours.   BUN 21 (H) 6 - 20 mg/dL   Creatinine, Ser 0.72 0.61 - 1.24 mg/dL   Calcium 7.6 (L) 8.9 - 10.3 mg/dL   GFR, Estimated >60 >60 mL/min    Comment: (NOTE) Calculated using the CKD-EPI Creatinine Equation (2021)    Anion gap 7 5 - 15    Comment: Performed at  Hospital, [address] [place], [place] [address]  CBC     Status: Abnormal    Collection Time: 10/25/2020  3:26 PM  Result Value Ref Range   WBC 10.3 4.0 - 10.5 K/uL   RBC 2.80 (L) 4.22 - 5.81 MIL/uL   Hemoglobin 7.8 (L) 13.0 - 17.0 g/dL   HCT 25.2 (L) 39.0 - 52.0 %   MCV 90.0 80.0 - 100.0 fL   MCH 27.9 26.0 - 34.0 pg   MCHC 31.0 30.0 - 36.0 g/dL   RDW 15.1 11.5 - 15.5 %   Platelets 288 150 - 400 K/uL   nRBC 0.0 0.0 - 0.2 %    Comment: Performed at [facility], [address] [place], [place] [address]  Basic metabolic panel     Status: Abnormal   Collection Time: 11/04/2020  3:26 PM  Result Value Ref Range   Sodium 134 (L) 135 - 145 mmol/L   Potassium 2.9 (L) 3.5 - 5.1 mmol/L   Chloride 103 98 - 111 mmol/L   CO2 24 22 - 32 mmol/L   Glucose, Bld 264 (H) 70 - 99 mg/dL    Comment: Glucose reference range applies only to samples taken after fasting for at least 8 hours.   BUN 21 (H) 6 - 20 mg/dL   Creatinine, Ser 0.69 0.61 - 1.24 mg/dL   Calcium 7.5 (L) 8.9 - 10.3 mg/dL   GFR, Estimated >60 >60 mL/min    Comment: (NOTE) Calculated using the CKD-EPI Creatinine Equation (2021)    Anion gap 7 5 - 15    Comment: Performed at [facility], [address] [place], [place] [address]  CK     Status: Abnormal   Collection Time: 11/01/2020  3:26 PM  Result Value Ref Range   Total CK 9 (L) 49 - 397 U/L    Comment: Performed at [facility], [address] [place], [place] [address]  Glucose, capillary     Status: Abnormal   Collection Time: 10/27/2020  3:50 PM  Result Value Ref Range   Glucose-Capillary 255 (H) 70 - 99 mg/dL    Comment: Glucose reference range applies only to samples taken after fasting for at least 8 hours.   Comment 1 Notify RN    Comment 2 Document in Chart     DG Forearm Left  Result Date: 10/22/2020 CLINICAL DATA:  Infected wound to the distal left forearm. EXAM: LEFT FOREARM - 2 VIEW COMPARISON:  09/05/2020 FINDINGS: Intravenous catheter in the antecubital fossa region. Decreased  soft tissue swelling since previous study. No soft tissue gas. No evidence of acute fracture or dislocation. No focal bone lesion or bone destruction. Degenerative changes in the wrist. IMPRESSION: Decreased soft tissue swelling since previous study.   No acute bony abnormalities. Electronically Signed   By: William  Stevens M.D.   On: 11/08/2020 20:09   CT WRIST LEFT WO CONTRAST  Result Date: 11/09/2020 CLINICAL DATA:  Left wrist draining abscess.  Sepsis. EXAM: CT OF THE LEFT WRIST WITHOUT CONTRAST TECHNIQUE: Multidetector CT imaging was performed according to the standard protocol. Multiplanar CT image reconstructions were also generated. COMPARISON:  Plain films 11/16/2020 FINDINGS: There is soft tissue swelling noted along the anterior aspect of the wrist and distal forearm. Edema noted throughout the tissues without well-defined focal abscess. There are locules of gas within the soft tissues. Findings most suggestive of cellulitis. No acute bony abnormality. No bone destruction to suggest osteomyelitis. IMPRESSION: Edema and locules of gas throughout the subcutaneous soft tissues in the anterior distal forearm. No well-defined focal abscess. This could be further evaluated with ultrasound if felt clinically indicated. Electronically Signed   By: Kevin  Dover M.D.   On: 10/28/2020 00:36   CT FEMUR RIGHT WO CONTRAST  Result Date: 10/26/2020 CLINICAL DATA:  Pain and swelling in right thigh. Soft tissue infection suspected. EXAM: CT OF THE LOWER RIGHT EXTREMITY WITHOUT CONTRAST TECHNIQUE: Multidetector CT imaging of the right lower extremity was performed according to the standard protocol. COMPARISON:  None. FINDINGS: Foley catheter present in the bladder. Large stool burden in the visualized rectum raising the possibility of fecal impaction. Recommend clinical correlation. Mild prostate enlargement with calcifications. Large soft tissue defect noted in the posteromedial upper right thigh. The defect extends  down to the muscles. Gas and fluid noted within the soft tissues along the surface of the hamstring muscles extending from the soft tissue defect in the proximal thigh distally to just above the knee concerning for abscess. It is difficult to determine if this is within the hamstring muscles or along the surface of the muscle. Largest collection of gas and fluid seen on axial image 187 of series 7 measures approximately 3.3 x 2.1 cm. There are some locules of gas which extend deep into the musculature (image 166, 154, 188) compatible with intramuscular abscesses. Extensive vascular calcifications. No acute bony abnormality. No fracture. No bone destruction to suggest osteomyelitis. IMPRESSION: Elongated gas and fluid collection noted along the surface of the hamstring muscles, in some areas extending deep into the hamstring muscles compatible with intramuscular abscess. This extends over an extensive area in length extending from the soft tissue defect in the proximal posteromedial right thigh distally to just above the knee. No evidence of osteomyelitis. Electronically Signed   By: Kevin  Dover M.D.   On: 11/16/2020 20:25   DG Chest Portable 1 View  Result Date: 10/26/2020 CLINICAL DATA:  Dyspnea. EXAM: PORTABLE CHEST 1 VIEW COMPARISON:  11/11/2020 FINDINGS: Right IJ central venous catheter unchanged. Lungs are adequately inflated without focal airspace consolidation, effusion or pneumothorax. Cardiomediastinal silhouette and remainder of the exam is unchanged. IMPRESSION: No active disease. Electronically Signed   By: Daniel  Boyle M.D.   On: 11/01/2020 14:05   DG Chest Portable 1 View  Result Date: 10/22/2020 CLINICAL DATA:  Central line placement EXAM: PORTABLE CHEST 1 VIEW COMPARISON:  Chest radiograph from one day prior. FINDINGS: Right internal jugular central venous catheter terminates at the cavoatrial junction. Stable cardiomediastinal silhouette with normal heart size. No pneumothorax. No pleural  effusion. Lungs appear clear, with no acute consolidative airspace disease and no pulmonary edema. Healed deformities in multiple posterior right mid ribs. IMPRESSION: Right internal jugular central venous catheter terminates at the cavoatrial junction. No pneumothorax.   No active cardiopulmonary disease. Electronically Signed   By: Jason A Poff M.D.   On: 10/29/2020 06:31   DG Chest Port 1 View  Result Date: 11/07/2020 CLINICAL DATA:  60-year-old male with weakness. EXAM: PORTABLE CHEST 1 VIEW COMPARISON:  Chest radiograph dated 08/18/2019 FINDINGS: Background of chronic interstitial coarsening and bronchitic changes. No focal consolidation, pleural effusion, or pneumothorax. The cardiac silhouette is within limits. No acute osseous pathology. IMPRESSION: No acute cardiopulmonary process. Electronically Signed   By: Arash  Radparvar M.D.   On: 11/12/2020 17:09    ROS Blood pressure 121/71, pulse (!) 103, temperature 97.8 F (36.6 C), temperature source Axillary, resp. rate (!) 27, height 5' 5" (1.651 m), weight 70 kg, SpO2 100 %. Physical Exam Constitutional:      Appearance: He is normal weight. He is ill-appearing and toxic-appearing.  HENT:     Head: Normocephalic.     Right Ear: External ear normal.     Left Ear: External ear normal.     Mouth/Throat:     Mouth: Mucous membranes are moist.  Eyes:     Extraocular Movements: Extraocular movements intact.  Cardiovascular:     Rate and Rhythm: Regular rhythm.  Pulmonary:     Effort: Pulmonary effort is normal.     Breath sounds: No wheezing.  Skin:    Capillary Refill: Capillary refill takes less than 2 seconds.   Patient was 5 cm round area packed with Kerlix foul-smelling drainage greater than 4 cm depth right proximal hamstring region.  Left wrist mid to distal forearm open area 1 cm with exposed palmaris longus tendon with purulent drainage expressed.  Patient able to extend and flex his fingers and states he has sensation to  fingertips.  Compartment on the left forearm is soft.  Assessment/Plan: Patient needs to go to the operating room for debridement of right posterior hamstring abscess with skin pressure breakdown over area where he   had previous hamstring tear several weeks ago.  This likely is infected hematoma with necrotic tissue present.  Plan debridement and VAC application.  He may need repetitive trips to the operating room.  Left forearm will require exploration debridement of necrotic tissue and VAC placement.  Procedure discussed patient decision for surgery emergent basis.  Erielle Gawronski C Bryann Mcnealy 11/19/2020, 4:46 PM      

## 2020-10-23 NOTE — ED Notes (Signed)
[facility] called for transport at this time. 

## 2020-10-24 ENCOUNTER — Encounter (HOSPITAL_COMMUNITY): Payer: Self-pay | Admitting: Orthopaedic Surgery

## 2020-10-24 ENCOUNTER — Inpatient Hospital Stay (HOSPITAL_COMMUNITY): Payer: Medicaid Other

## 2020-10-24 DIAGNOSIS — L02415 Cutaneous abscess of right lower limb: Secondary | ICD-10-CM | POA: Diagnosis not present

## 2020-10-24 DIAGNOSIS — R7881 Bacteremia: Secondary | ICD-10-CM

## 2020-10-24 DIAGNOSIS — B9561 Methicillin susceptible Staphylococcus aureus infection as the cause of diseases classified elsewhere: Secondary | ICD-10-CM

## 2020-10-24 DIAGNOSIS — A4101 Sepsis due to Methicillin susceptible Staphylococcus aureus: Principal | ICD-10-CM

## 2020-10-24 DIAGNOSIS — L02414 Cutaneous abscess of left upper limb: Secondary | ICD-10-CM | POA: Diagnosis not present

## 2020-10-24 DIAGNOSIS — R6521 Severe sepsis with septic shock: Secondary | ICD-10-CM | POA: Diagnosis not present

## 2020-10-24 DIAGNOSIS — A419 Sepsis, unspecified organism: Secondary | ICD-10-CM | POA: Diagnosis not present

## 2020-10-24 LAB — BASIC METABOLIC PANEL
Anion gap: 5 (ref 5–15)
Anion gap: 5 (ref 5–15)
BUN: 14 mg/dL (ref 6–20)
BUN: 16 mg/dL (ref 6–20)
CO2: 29 mmol/L (ref 22–32)
CO2: 28 mmol/L (ref 22–32)
Calcium: 7.5 mg/dL — ABNORMAL LOW (ref 8.9–10.3)
Calcium: 7.6 mg/dL — ABNORMAL LOW (ref 8.9–10.3)
Chloride: 103 mmol/L (ref 98–111)
Chloride: 107 mmol/L (ref 98–111)
Creatinine, Ser: 0.5 mg/dL — ABNORMAL LOW (ref 0.61–1.24)
Creatinine, Ser: 0.49 mg/dL — ABNORMAL LOW (ref 0.61–1.24)
GFR, Estimated: >60 mL/min (ref >60)
GFR, Estimated: >60 mL/min (ref >60)
Glucose, Bld: 200 mg/dL — ABNORMAL HIGH (ref 70–99)
Glucose, Bld: 263 mg/dL — ABNORMAL HIGH (ref 70–99)
Potassium: 2.7 mmol/L — CL (ref 3.5–5.1)
Potassium: 3.3 mmol/L — ABNORMAL LOW (ref 3.5–5.1)
Sodium: 137 mmol/L (ref 135–145)
Sodium: 140 mmol/L (ref 135–145)

## 2020-10-24 LAB — ECHOCARDIOGRAM COMPLETE
Area-P 1/2: 3.27 cm2
Height: 65 in
S' Lateral: 3.2 cm
Weight: 2670.21 oz

## 2020-10-24 LAB — CBC
HCT: 21.7 % — ABNORMAL LOW (ref 39.0–52.0)
Hemoglobin: 6.6 g/dL — CL (ref 13.0–17.0)
MCH: 27.3 pg (ref 26.0–34.0)
MCHC: 30.4 g/dL (ref 30.0–36.0)
MCV: 89.7 fL (ref 80.0–100.0)
Platelets: 208 10*3/uL (ref 150–400)
RBC: 2.42 MIL/uL — ABNORMAL LOW (ref 4.22–5.81)
RDW: 15.4 % (ref 11.5–15.5)
WBC: 8.2 10*3/uL (ref 4.0–10.5)
nRBC: 0 % (ref 0.0–0.2)

## 2020-10-24 LAB — GLUCOSE, CAPILLARY
Glucose-Capillary: 245 mg/dL — ABNORMAL HIGH (ref 70–99)
Glucose-Capillary: 205 mg/dL — ABNORMAL HIGH (ref 70–99)
Glucose-Capillary: 201 mg/dL — ABNORMAL HIGH (ref 70–99)
Glucose-Capillary: 232 mg/dL — ABNORMAL HIGH (ref 70–99)
Glucose-Capillary: 230 mg/dL — ABNORMAL HIGH (ref 70–99)

## 2020-10-24 LAB — HEMOGLOBIN A1C
Hgb A1c MFr Bld: 12.7 % — ABNORMAL HIGH (ref 4.8–5.6)
Hgb A1c MFr Bld: 12.9 % — ABNORMAL HIGH (ref 4.8–5.6)
Mean Plasma Glucose: 318 mg/dL
Mean Plasma Glucose: 323.53 mg/dL

## 2020-10-24 LAB — LACTIC ACID, PLASMA: Lactic Acid, Venous: 1.5 mmol/L (ref 0.5–1.9)

## 2020-10-24 LAB — HEMOGLOBIN AND HEMATOCRIT, BLOOD
HCT: 25.5 % — ABNORMAL LOW (ref 39.0–52.0)
Hemoglobin: 8 g/dL — ABNORMAL LOW (ref 13.0–17.0)

## 2020-10-24 LAB — ABO/RH: ABO/RH(D): O POS

## 2020-10-24 LAB — PREPARE RBC (CROSSMATCH)

## 2020-10-24 MED ORDER — PHENOL 1.4 % MT LIQD
1.0000 | OROMUCOSAL | Status: DC | PRN
  Administered 2020-10-25: 1 via OROMUCOSAL
  Filled 2020-10-24: qty 177

## 2020-10-24 MED ORDER — LACTATED RINGERS IV SOLN: INTRAVENOUS | Status: DC

## 2020-10-24 MED ORDER — MELATONIN 3 MG PO TABS
3.0000 mg | ORAL_TABLET | Freq: Every evening | ORAL | Status: AC | PRN
  Administered 2020-10-24 – 2020-10-29 (×3): 3 mg via ORAL
  Filled 2020-10-24 (×4): qty 1

## 2020-10-24 MED ORDER — POTASSIUM CHLORIDE 20 MEQ PO PACK
40.0000 meq | PACK | Freq: Once | ORAL | Status: AC
  Administered 2020-10-24: 40 meq via ORAL
  Filled 2020-10-24: qty 2

## 2020-10-24 MED ORDER — POTASSIUM CHLORIDE 10 MEQ/50ML IV SOLN
10.0000 meq | INTRAVENOUS | Status: AC
  Administered 2020-10-24 (×4): 10 meq via INTRAVENOUS
  Filled 2020-10-24 (×4): qty 50

## 2020-10-24 MED ORDER — SODIUM CHLORIDE 0.9% IV SOLUTION: Freq: Once | INTRAVENOUS | Status: AC

## 2020-10-24 MED ORDER — POTASSIUM CHLORIDE 10 MEQ/50ML IV SOLN
10.0000 meq | INTRAVENOUS | Status: AC
  Administered 2020-10-24 – 2020-10-25 (×4): 10 meq via INTRAVENOUS
  Filled 2020-10-24 (×4): qty 50

## 2020-10-24 NOTE — Plan of Care (Signed)
  Problem: Education: Goal: Knowledge of General Education information will improve Description: Including pain rating scale, medication(s)/side effects and non-pharmacologic comfort measures Outcome: Not Progressing   Problem: Health Behavior/Discharge Planning: Goal: Ability to manage health-related needs will improve Outcome: Not Progressing   Problem: Clinical Measurements: Goal: Respiratory complications will improve Outcome: Progressing   Problem: Nutrition: Goal: Adequate nutrition will be maintained Outcome: Not Progressing   Problem: Pain Managment: Goal: General experience of comfort will improve Outcome: Progressing

## 2020-10-24 NOTE — Progress Notes (Signed)
K 2.7, Hgb 6.6. Canary Brim NP notified and orders received for IV K runs and 1 unit PRBC.

## 2020-10-24 NOTE — Evaluation (Signed)
Clinical/Bedside Swallow Evaluation Patient Details  Name: Ronald Brady MRN: [id number] Date of Birth: [birth year]  Today's Date: 10/24/2020 Time: SLP Start Time (ACUTE ONLY): 1510 SLP Stop Time (ACUTE ONLY): 1530 SLP Time Calculation (min) (ACUTE ONLY): 20 min  Past Medical History:  Past Medical History:  Diagnosis Date  . Diabetes mellitus without complication (HCC)   . Emphysema lung (HCC)   . Hypertension    Past Surgical History:  Past Surgical History:  Procedure Laterality Date  . ABDOMINAL SURGERY    . INCISION AND DRAINAGE Left 11/12/20   Procedure: INCISION AND DRAINAGE  AND DEBRIDEMENT LEFT WRIST AND RIGHT POSTERIOR THIGH APPLICATION OF WOUND VACS TO LEFT WRIST WOUND AND RIGHT POSTERIOR THIGH WOUND;  Surgeon: Eldred Manges, MD;  Location: [facility];  Service: Orthopedics;  Laterality: Left;   HPI:  Patient is a 60 y.o. male with PMH: DM, HTN, emphysema who was brought to ED via EMS after he was found by his sister at home. Patient had been sitting i a chair for four days unable to get up and patient reported he had torn hamstring which prevented him from getting up from chair. In addition, he apparently had not been eating anything or taking his insulin. On admission, he was hypothermic temperature 95.7, tachycardic, hypotensive BP, UA with moderate leukocytes, yeast, draining abcess noted on right thigh and left wrist, CT right femur showed elongated gas and fluid collection along surface of hamstring muscles extending deep into hamstring muscle compatible with intramuscular abcess. He had MBS in 2021 secondary to suspected post-extubation dysphagia and at that time had been started on puree solids and pudding thick liquids.   Assessment / Plan / Recommendation Clinical Impression  Patient presents with a mild-moderate primarily pharyngeal dysphagia which is secondary to SLP's observations during today's evaluation as well as patient's complaints. Patient stated that he has not  been able to swallow solids and even pudding is difficult unless he leaves it in his mouth a little to mix with saliva. During today's evaluation, he consumed gelatin and plain, thin water. He did not exhbiit any overt s/s aspiration or penetration but is with suspected swallow initiation delay. In addition, patient with mod-severe dysphagia s/p extubation January of 2021. His voice continues to be hoarse. SLP Visit Diagnosis: Dysphagia, unspecified (R13.10)    Aspiration Risk  Mild aspiration risk;Moderate aspiration risk    Diet Recommendation Thin liquid;Other (Comment) (full liquids)   Liquid Administration via: Cup;Straw Medication Administration: Crushed with puree Supervision: Patient able to self feed;Intermittent supervision to cue for compensatory strategies Compensations: Minimize environmental distractions;Slow rate;Small sips/bites Postural Changes: Seated upright at 90 degrees    Other  Recommendations Oral Care Recommendations: Oral care BID   Follow up Recommendations Other (comment) (TBD)      Frequency and Duration min 1 x/week  2 weeks       Prognosis Prognosis for Safe Diet Advancement: Good      Swallow Study   General Date of Onset: 11-12-20 HPI: Patient is a 60 y.o. male with PMH: DM, HTN, emphysema who was brought to ED via EMS after he was found by his sister at home. Patient had been sitting i a chair for four days unable to get up and patient reported he had torn hamstring which prevented him from getting up from chair. In addition, he apparently had not been eating anything or taking his insulin. On admission, he was hypothermic temperature 95.7, tachycardic, hypotensive BP, UA with moderate leukocytes, yeast, draining  abcess noted on right thigh and left wrist, CT right femur showed elongated gas and fluid collection along surface of hamstring muscles extending deep into hamstring muscle compatible with intramuscular abcess. He had MBS in 2021 secondary to  suspected post-extubation dysphagia and at that time had been started on puree solids and pudding thick liquids. Type of Study: Bedside Swallow Evaluation Previous Swallow Assessment: during previous admission Diet Prior to this Study: Regular;Thin liquids Temperature Spikes Noted: No Respiratory Status: Room air History of Recent Intubation: No Behavior/Cognition: Alert;Cooperative;Pleasant mood Oral Cavity Assessment: Within Functional Limits Oral Care Completed by SLP: Yes Oral Cavity - Dentition: Edentulous Vision: Functional for self-feeding Self-Feeding Abilities: Able to feed self Patient Positioning: Upright in bed Baseline Vocal Quality: Hoarse Volitional Cough: Weak Volitional Swallow: Able to elicit    Oral/Motor/Sensory Function Overall Oral Motor/Sensory Function: Within functional limits   Ice Chips     Thin Liquid Thin Liquid: Impaired Presentation: Straw Pharyngeal  Phase Impairments: Suspected delayed Swallow    Nectar Thick     Honey Thick     Puree Puree: Not tested   Solid     Solid: Impaired (gelatin) Presentation: Spoon Pharyngeal Phase Impairments: Other (comments);Suspected delayed Swallow      Angela Nevin, MA, CCC-SLP Speech Therapy

## 2020-10-24 NOTE — Progress Notes (Signed)
NAME:  Ronald Brady, MRN:  [id number], DOB:  09-28-1960, LOS: 2 ADMISSION DATE:  11/08/2020, CONSULTATION DATE:  4/3 REFERRING MD:  [facility], CHIEF COMPLAINT:  sepsis   History of Present Illness:  60 y/o M, smoker, with poorly controlled DM who presented to [facility] 4/2 after being found at home in a chair covered in urine & feces.  The patient was seen 09/28/20 by Ortho for a hamstring tear.  He was found in his home having sat in a chair for four days incontinent of urine / feces.  He was found to have hypotension which responded to IVF, noted to have an abscess on his upper posterior right leg and left wrist with exposed tendon. I&D was completed in the ER by EDP. CT of the right femur showed elongated gas and fluid collection along the surface of the hamstring muscles extending deep into the hamstring muscle compatible with intramuscular abscess. Clindamycin was initiated. He required insulin gtt for DKA. He was transferred 4/3 to [facility] for further care. The patient required I&D per Ortho with VAC placement on right thigh and left wrist.  Pertinent  Medical History  Tobacco Abuse - began smoking at 67 Worked as a logger  Poorly controlled DM - Hgb A1c 11.8 in 07/2019  HTN   Significant Hospital Events: Including procedures, antibiotic start and stop dates in addition to other pertinent events    4/02 presented to ER in shock due to multiple abscess (right thigh, left wrist), DKA. CT femur found elongated gas and fluid collection in the right hamstring c/w abscess. No evidence of osteomyelitis. Treated with clinda, meropenem, cultured, central line placed. I&D in ER.   4/03 Wrist CT with gas throughout the SQ tissues in the anterior distal forearm. Ortho consulted > taken to OR for debridement of right posterior hamstring and left forearm. Intraoperative cultures obtained. Cultures growing staph aureus (sensitivities pending). Vanco added but abx narrowed to cefazolin late pm  4/04 Ortho note  reflects concern for poor wound healing, may need hip disarticulation.    Interim History / Subjective:  RN reports wound VAC on right hip not running properly, K low, Hgb low  Afebrile / tmax 98.1 / WBC 8.2  On levophed  [place] O2 2L  Pt reports significant pain from right leg, less so from left arm  Cultures growing staph aureus    Objective   Blood pressure (!) 85/50, pulse (!) 101, temperature 98.1 F (36.7 C), temperature source Oral, resp. rate 18, height 5\' 5"  (1.651 m), weight 75.7 kg, SpO2 98 %. CVP:  [9 mmHg] 9 mmHg  Vent Mode: BIPAP FiO2 (%):  [50 %] 50 % Set Rate:  [10 bmp] 10 bmp PEEP:  [8 cmH20] 8 cmH20 Pressure Support:  [8 cmH20] 8 cmH20   Intake/Output Summary (Last 24 hours) at 10/24/2020 1027 Last data filed at 10/24/2020 0515 Gross per 24 hour  Intake 1512.48 ml  Output 1775 ml  Net -262.52 ml   Filed Weights   10/29/2020 1551 Nov 05, 2020 1510 10/24/20 0515  Weight: 68 kg 70 kg 75.7 kg    Examination: General: chronically ill appearing adult male lying in bed in NAD  HEENT: MM pink/moist, [place] O2, anicteric Neuro: AAOx4, speech clear, MAE but guarded with movement of RLE due to pain  CV: s1s2 RRR, no m/r/g PULM: non-labored on 2L O2, lungs bilaterally with coarse rhonchi  GI: soft, bsx4 active  Extremities: warm/dry, no edema, right thigh wrapped in ACE wrap /  VAC in place, left wrist wrapped in ACE wrap with VAC in place Skin: thin skin with multiple small abrasions on forearms           Resolved Hospital Problem list     Assessment & Plan:   Septic Shock secondary Staph Aureus Abscess of R Thigh, L Wrist with Bacteremia  -LR at 50 ml/hr -continue levophed / vasopressin for MAP >65  -follow cultures to maturity, reported sensitive staph per BCID  -continue cefazolin  -appreciate ORTHO evaluation > note possible consideration for hip disarticulation -PRN pain control with bowel regimen   Poorly Controlled DM with Hyperglycemia  Hyperglycemia on  admit with glucose >900, small ketones in urine.     -await Hgb A1c  -continue SSI, ACHS -meal coverage TID -4 units TID with meals -continue lantus 15 units QHS -continue metformin   Hypokalemia  -monitor electrolytes, replace as indicated -KCL 40 mEq IV + KCL 40 mEq PO  Anemia  Suspect in setting of oozing / wound blood loss  -follow CBC -transfuse 1 unit PRBC now  -follow up H/H post transfusion per protocol  -hold SQ Heparin 4/4, consider restarting 4/5  Active Smoker, Suspected COPD / Asthmatic Bronchitis -continue duoneb  -continue dulera  -wean O2 to off for sats >90% -will need outpatient evaluation for COPD -smoking cessation counseling   Best practice (right click and "Reselect all SmartList Selections" daily)  Diet:  Oral Pain/Anxiety/Delirium protocol (if indicated): No VAP protocol (if indicated): Not indicated DVT prophylaxis: Subcutaneous Heparin and SCD GI prophylaxis: PPI Glucose control:  SSI Yes Central venous access:  Yes, and it is still needed Arterial line:  N/A Foley:  Yes, and it is still needed Mobility:  bed rest  PT consulted: N/A Last date of multidisciplinary goals of care discussion n/a Code Status:  full code Disposition: ICU     Critical Care Time: 34 minutes   Canary Brim, MSN, APRN, NP-C, AGACNP-BC [facility] 10/24/2020, 10:28 AM   Please see [email or web address] for pager details.   From 7A-7P if no response, please call [phone] After hours, please call ELink [phone]

## 2020-10-24 NOTE — TOC Initial Note (Signed)
Transition of Care [facility]) - Initial/Assessment Note    Patient Details  Name: Ronald Brady MRN: [id number] Date of Birth: 06/21/1961  Transition of Care [facility] CM/SW Contact:    Golda Acre, RN Phone Number: 10/24/2020, 7:50 AM  Clinical Narrative:                 Diabetic with right hamstring draining abscess, left volar forearm subcutaneous abscess, draining.  Postop diagnosis: Same  Procedure:1. Sharp excisional debridement of right posterior thigh abscess with excisional debridement of skin, subcutaneous tissue, muscle, tendon, VAC placement.  2.  Sharp excisional debridement left volar forearm subcutaneous abscess with sharp excisional debridement of skin subcutaneous tissue and tendon.  VAC placement. PLAN :lives alone may need hhc for dressing changes due to ham string area Expected Discharge Plan: Home w Home Health Services Barriers to Discharge: Continued Medical Work up   Patient Goals and CMS Choice Patient states their goals for this hospitalization and ongoing recovery are:: to go home CMS Medicare.gov Compare Post Acute Care list provided to:: Patient    Expected Discharge Plan and Services Expected Discharge Plan: Home w Home Health Services   Discharge Planning Services: CM Consult   Living arrangements for the past 2 months: Single Family Home                                      Prior Living Arrangements/Services Living arrangements for the past 2 months: Single Family Home Lives with:: Self Patient language and need for interpreter reviewed:: Yes Do you feel safe going back to the place where you live?: Yes      Need for Family Participation in Patient Care: Yes (Comment) Care giver support system in place?: Yes (comment)   Criminal Activity/Legal Involvement Pertinent to Current Situation/Hospitalization: No - Comment as needed  Activities of Daily Living      Permission Sought/Granted                  Emotional  Assessment Appearance:: Appears stated age Attitude/Demeanor/Rapport: Engaged Affect (typically observed): Calm Orientation: : Oriented to Place,Oriented to Self,Oriented to  Time,Oriented to Situation Alcohol / Substance Use: Not Applicable Psych Involvement: No (comment)  Admission diagnosis:  Hyperglycemia [R73.9] Severe dehydration [E86.0] Abscess of left forearm [L02.414] AKI (acute kidney injury) (HCC) [N17.9] Severe sepsis (HCC) [A41.9, R65.20] Abscess of right lower extremity [L02.415] Patient Active Problem List   Diagnosis Date Noted  . Severe sepsis (HCC) 11/01/2020  . Abscess of right thigh 11/14/2020  . Abscess of left forearm 10/21/2020  . Diabetes mellitus (HCC) 11/09/2020  . Hyperosmolar hyperglycemic state (HHS) (HCC) 11/09/2020  . AKI (acute kidney injury) (HCC) 10/26/2020  . Acute lower UTI 10/23/2020  . Emphysema lung (HCC) 11/02/2020  . 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

## 2020-10-24 NOTE — Progress Notes (Signed)
  Echocardiogram 2D Echocardiogram has been performed.  Leta Jungling M 10/24/2020, 2:20 PM

## 2020-10-24 NOTE — Progress Notes (Signed)
OT Cancellation Note  Patient Details Name: Ronald Brady MRN: [id number] DOB: 1961/02/13   Cancelled Treatment:    Reason Eval/Treat Not Completed: Medical issues which prohibited therapy;Patient at procedure or test/ unavailable:  Attempted to see pt in AM and pt receiving wound care with medical team.  Checked on pt again in PM, however pt now with Hgb of 6.6, therefore will hold according to Rehab protocol on therapy hold with Hgb <7.0. Will attempt Evaluation tomorrow, time allowing.   Theodoro Clock 10/24/2020, 12:57 PM

## 2020-10-24 NOTE — Progress Notes (Signed)
[facility]  Date of Admission:  10-28-2020     CC: mssa bacteremia  Lines: peripheral  Abx: 4/04-c cefazolin  4/02-03 clinda, meropenem, vanc  ASSESSMENT: 60 yo male with dm admitted after found down/stuck between furniture, for severe sepsis (hypothermia) in setting abscess in RLE (posterior thigh) along with left volar wrist and distal forearm exposed tendon/soft tissue infection-abscess, and mssa bacteremia   #mssa bacteremia #severe sepsis with septic shock tte is pending  4/02 initial bcx mssa; source likely the abscesses He had no hardware/catheter prior to this Ct scan respective area involved no osseous involvement  He had undergone I&D of both mentioned area on 4/03; cx in progress. There is pus coming out of the right thigh woundvac still. Will see if he needs further debridment  He still remains very ill on low dose pressor, although improving overall   -repeat bcx today -will need to get tee if tte is negative -continue cefazolin  I spent more than 35 minute reviewing data/chart, and coordinating care and >50% direct face to face time providing counseling/discussing diagnostics/treatment plan with patient    Principal Problem:   Severe sepsis (HCC) Active Problems:   Abscess of right thigh   Abscess of left forearm   Diabetes mellitus (HCC)   Hyperosmolar hyperglycemic state (HHS) (HCC)   AKI (acute kidney injury) (HCC)   Acute lower UTI   Emphysema lung (HCC)   Abscess of right lower extremity   Allergies  Allergen Reactions  . Codeine Nausea And Vomiting  . Penicillins Other (See Comments)    Pt is unsure of reaction. States PCP told him he was allergic. Did it involve swelling of the face/tongue/throat, SOB, or low BP? Unknown Did it involve sudden or severe rash/hives, skin peeling, or any reaction on the inside of your mouth or nose? Unknown Did you need to seek medical attention at a hospital or  doctor's office? Unknown When did it last happen?unk If all above answers are "NO", may proceed with cephalosporin use.     Scheduled Meds: . sodium chloride   Intravenous Once  . chlorhexidine  15 mL Mouth Rinse BID  . Chlorhexidine Gluconate Cloth  6 each Topical Daily  . docusate sodium  100 mg Oral BID  . heparin  5,000 Units Subcutaneous Q8H  . insulin aspart  0-5 Units Subcutaneous QHS  . insulin aspart  0-9 Units Subcutaneous TID WC  . insulin aspart  4 Units Subcutaneous TID WC  . insulin glargine  15 Units Subcutaneous QHS  . ipratropium-albuterol  3 mL Nebulization QID  . mouth rinse  15 mL Mouth Rinse q12n4p  . metFORMIN  1,000 mg Oral BID WC  . mometasone-formoterol  2 puff Inhalation BID  . nicotine  21 mg Transdermal Daily  . potassium chloride  40 mEq Oral Once  . sodium chloride flush  10-40 mL Intracatheter Q12H   Continuous Infusions: .  ceFAZolin (ANCEF) IV Stopped (10/24/20 0700)  . dextrose 5% lactated ringers 125 mL/hr at 10/28/2020 2116  . norepinephrine (LEVOPHED) Adult infusion 2 mcg/min (10/24/20 0631)  . potassium chloride     PRN Meds:.albuterol, dextrose, fentaNYL (SUBLIMAZE) injection, metoCLOPramide **OR** metoCLOPramide (REGLAN) injection, morphine injection, ondansetron **OR** ondansetron (ZOFRAN) IV, oxyCODONE-acetaminophen, sodium chloride flush   SUBJECTIVE: Patient still on pressors but low dose Fever resolved Pus out of right thigh wound vac No headache, visual change, back pain No n/v/diarrhea No cough/chest pain/sob  Review of Systems: ROS All other ROS was negative, except mentioned above     OBJECTIVE: Vitals:   10/24/20 1000 10/24/20 1015 10/24/20 1030 10/24/20 1045  BP: (!) 96/47 (!) 110/55 108/65 (!) 106/56  Pulse: (!) 103 99 97 96  Resp: (!) 30 (!) 30 (!) 24 (!) 22  Temp:      TempSrc:      SpO2: 97% 99% 99% 99%  Weight:      Height:       Body mass index is 27.77 kg/m.  Physical  Exam General/constitutional: no distress, pleasant HEENT: Normocephalic, PER, Conj Clear, EOMI, Oropharynx clear Neck supple CV: rrr no mrg Lungs: clear to auscultation, normal respiratory effort Abd: Soft, Nontender Ext: no edema Skin: No Rash Neuro: nonfocal MSK: no back tenderness. Tender along right thigh palpation. Left forearm/thigh wound vac. The right thigh wound vac with purulence/serosanguinous output. Mild swelling around right thigh and left forearm wound vac   Central line presence: yes, right ij. Site no purulence, erythema   Lab Results Lab Results  Component Value Date   WBC 8.2 10/24/2020   HGB 6.6 (LL) 10/24/2020   HCT 21.7 (L) 10/24/2020   MCV 89.7 10/24/2020   PLT 208 10/24/2020    Lab Results  Component Value Date   CREATININE 0.50 (L) 10/24/2020   BUN 14 10/24/2020   NA 137 10/24/2020   K 2.7 (LL) 10/24/2020   CL 103 10/24/2020   CO2 29 10/24/2020    Lab Results  Component Value Date   ALT 13 10/29/2020   AST 14 (L) 10/26/2020   ALKPHOS 91 11/04/2020   BILITOT 1.1 11/06/2020      Microbiology: Recent Results (from the past 240 hour(s))  Blood culture (routine x 2)     Status: Abnormal (Preliminary result)   Collection Time: 11/14/2020  4:12 PM   Specimen: Right Antecubital; Blood  Result Value Ref Range Status   Specimen Description   Final    RIGHT ANTECUBITAL BOTTLES DRAWN AEROBIC AND ANAEROBIC Performed at [facility], [address] [place], [place] [address]    Special Requests   Final    Blood Culture results may not be optimal due to an inadequate volume of blood received in culture bottles Performed at [facility], [address], [place], [place] [address]    Culture  Setup Time   Final    GRAM POSITIVE COCCI Aerobic bottle Gram Stain Report Called to,Read Back By and Verified With: CRAWFORD,H@1015  BY MATTHEWS, B 25-Oct-2020 CRITICAL VALUE NOTED.  VALUE IS CONSISTENT WITH PREVIOUSLY REPORTED AND CALLED VALUE. Performed at [facility], [address] [place], [place] [address]    Culture STAPHYLOCOCCUS AUREUS (A)  Final   Report Status PENDING  Incomplete  Blood culture (routine x 2)     Status: Abnormal (Preliminary result)   Collection Time: 11/08/2020  4:13 PM   Specimen: Left Antecubital; Blood  Result Value Ref Range Status   Specimen Description   Final    LEFT ANTECUBITAL BOTTLES DRAWN AEROBIC AND ANAEROBIC Performed at [facility], [address], [place], [place] [address]    Special Requests   Final    Blood Culture adequate volume Performed at [facility], [address] [place], [place] [address]    Culture  Setup Time   Final    GRAM POSITIVE COCCI AEROBIC AND ANAEROBIC BOTTLE Gram Stain Report Called to,Read Back By and Verified With: CRAWFORD,H@1015  BY MATTHEWS,B 10/25/20 Organism ID to follow  CRITICAL RESULT CALLED TO, READ BACK BY AND VERIFIED WITH: S CHRISTY PHARMD @1453  10/21/2020 EB    Culture (A)  Final    STAPHYLOCOCCUS AUREUS SUSCEPTIBILITIES TO FOLLOW Performed at [facility], [address] [place], [place]    Report Status PENDING  Incomplete  Blood Culture ID Panel (Reflexed)     Status: Abnormal   Collection Time: Nov 15, 2020  4:13 PM  Result Value Ref Range Status   Enterococcus faecalis NOT DETECTED NOT DETECTED Final   Enterococcus Faecium NOT DETECTED NOT DETECTED Final   Listeria monocytogenes NOT DETECTED NOT DETECTED Final   Staphylococcus species DETECTED (A) NOT DETECTED Final    Comment: CRITICAL RESULT CALLED TO, READ BACK BY AND VERIFIED WITH: S CHRISTY PHARMD @1453  10/26/2020 EB    Staphylococcus aureus (BCID) DETECTED (A) NOT DETECTED Final    Comment: CRITICAL RESULT CALLED TO, READ BACK BY AND VERIFIED WITH: S CHRISTY PHARMD @1453  10/29/2020 EB    Staphylococcus epidermidis NOT DETECTED NOT DETECTED Final   Staphylococcus lugdunensis NOT DETECTED NOT DETECTED Final   Streptococcus species NOT DETECTED NOT DETECTED Final   Streptococcus  agalactiae NOT DETECTED NOT DETECTED Final   Streptococcus pneumoniae NOT DETECTED NOT DETECTED Final   Streptococcus pyogenes NOT DETECTED NOT DETECTED Final   A.calcoaceticus-baumannii NOT DETECTED NOT DETECTED Final   Bacteroides fragilis NOT DETECTED NOT DETECTED Final   Enterobacterales NOT DETECTED NOT DETECTED Final   Enterobacter cloacae complex NOT DETECTED NOT DETECTED Final   Escherichia coli NOT DETECTED NOT DETECTED Final   Klebsiella aerogenes NOT DETECTED NOT DETECTED Final   Klebsiella oxytoca NOT DETECTED NOT DETECTED Final   Klebsiella pneumoniae NOT DETECTED NOT DETECTED Final   Proteus species NOT DETECTED NOT DETECTED Final   Salmonella species NOT DETECTED NOT DETECTED Final   Serratia marcescens NOT DETECTED NOT DETECTED Final   Haemophilus influenzae NOT DETECTED NOT DETECTED Final   Neisseria meningitidis NOT DETECTED NOT DETECTED Final   Pseudomonas aeruginosa NOT DETECTED NOT DETECTED Final   Stenotrophomonas maltophilia NOT DETECTED NOT DETECTED Final   Candida albicans NOT DETECTED NOT DETECTED Final   Candida auris NOT DETECTED NOT DETECTED Final   Candida glabrata NOT DETECTED NOT DETECTED Final   Candida krusei NOT DETECTED NOT DETECTED Final   Candida parapsilosis NOT DETECTED NOT DETECTED Final   Candida tropicalis NOT DETECTED NOT DETECTED Final   Cryptococcus neoformans/gattii NOT DETECTED NOT DETECTED Final   Meth resistant mecA/C and MREJ NOT DETECTED NOT DETECTED Final    Comment: Performed at [facility], [address] [place], [facility] [address]  Resp Panel by RT-PCR (Flu A&B, Covid) Nasopharyngeal Swab     Status: None   Collection Time: 2020-11-15  6:16 PM   Specimen: Nasopharyngeal Swab; Nasopharyngeal(NP) swabs in vial transport medium  Result Value Ref Range Status   SARS Coronavirus 2 by RT PCR NEGATIVE NEGATIVE Final    Comment: (NOTE) SARS-CoV-2 target nucleic acids are NOT DETECTED.  The SARS-CoV-2 RNA is generally detectable  in upper respiratory specimens during the acute phase of infection. The lowest concentration of SARS-CoV-2 viral copies this assay can detect is 138 copies/mL. A negative result does not preclude SARS-Cov-2 infection and should not be used as the sole basis for treatment or other patient management decisions. A negative result may occur with  improper specimen collection/handling, submission of specimen other than nasopharyngeal swab, presence of viral mutation(s) within the areas targeted by this assay, and inadequate number of viral  copies(<138 copies/mL). A negative result must be combined with clinical observations, patient history, and epidemiological information. The expected result is Negative.  Fact Sheet for Patients:  BloggerCourse.com  Fact Sheet for Healthcare Providers:  SeriousBroker.it  This test is no t yet approved or cleared by the [place] FDA and  has been authorized for detection and/or diagnosis of SARS-CoV-2 by FDA under an Emergency Use Authorization (EUA). This EUA will remain  in effect (meaning this test can be used) for the duration of the COVID-19 declaration under Section 564(b)(1) of the Act, 21 U.S.C.section 360bbb-3(b)(1), unless the authorization is terminated  or revoked sooner.       Influenza A by PCR NEGATIVE NEGATIVE Final   Influenza B by PCR NEGATIVE NEGATIVE Final    Comment: (NOTE) The Xpert Xpress SARS-CoV-2/FLU/RSV plus assay is intended as an aid in the diagnosis of influenza from Nasopharyngeal swab specimens and should not be used as a sole basis for treatment. Nasal washings and aspirates are unacceptable for Xpert Xpress SARS-CoV-2/FLU/RSV testing.  Fact Sheet for Patients: BloggerCourse.com  Fact Sheet for Healthcare Providers: SeriousBroker.it  This test is not yet approved or cleared by the [place] FDA and has  been authorized for detection and/or diagnosis of SARS-CoV-2 by FDA under an Emergency Use Authorization (EUA). This EUA will remain in effect (meaning this test can be used) for the duration of the COVID-19 declaration under Section 564(b)(1) of the Act, 21 U.S.C. section 360bbb-3(b)(1), unless the authorization is terminated or revoked.  Performed at [facility], [address], [place], [place] [address]   MRSA PCR Screening     Status: None   Collection Time: 11/09/2020  3:13 PM   Specimen: Nasopharyngeal  Result Value Ref Range Status   MRSA by PCR NEGATIVE NEGATIVE Final    Comment:        The GeneXpert MRSA Assay (FDA approved for NASAL specimens only), is one component of a comprehensive MRSA colonization surveillance program. It is not intended to diagnose MRSA infection nor to guide or monitor treatment for MRSA infections. Performed at [facility], [address] [place], [place] [address]   Aerobic/Anaerobic Culture w Gram Stain (surgical/deep wound)     Status: None (Preliminary result)   Collection Time: 11/09/2020  7:58 PM   Specimen: Wound  Result Value Ref Range Status   Specimen Description   Final    WOUND Performed at [facility], [address], [place], [place] [address]    Special Requests RIGHT HAMSTRING PATIENT ON FOLLOWING MERREM  Final   Gram Stain   Final    FEW WBC PRESENT,BOTH PMN AND MONONUCLEAR ABUNDANT GRAM POSITIVE COCCI IN PAIRS IN CLUSTERS Performed at [facility], [address], [place], [place] [address]    Culture PENDING  Incomplete   Report Status PENDING  Incomplete     Serology:   Imaging: If present, new imagings (plain films, ct scans, and mri) have been personally visualized and interpreted; radiology reports have been reviewed. Decision making incorporated into the Impression / Recommendations.  4/02 ct femur right Elongated gas and fluid collection noted along the surface  of the hamstring muscles, in some areas extending deep into the hamstring muscles compatible with intramuscular abscess. This extends over an extensive area in length extending from the soft tissue defect in the proximal posteromedial right thigh distally to just above the knee.  No evidence of osteomyelitis.   4/03 ct left forearm Edema and  locules of gas throughout the subcutaneous soft tissues in the anterior distal forearm. No well-defined focal abscess. This could be further evaluated with ultrasound if felt clinically Indicated.   Raymondo Bandrung T Laurielle Selmon, MD [facility] [phone] pager    10/24/2020, 11:20 AM

## 2020-10-24 NOTE — Progress Notes (Addendum)
   Subjective: 1 Day Post-Op Procedure(s) (LRB): INCISION AND DRAINAGE  AND DEBRIDEMENT LEFT WRIST AND RIGHT POSTERIOR THIGH APPLICATION OF WOUND VACS TO LEFT WRIST WOUND AND RIGHT POSTERIOR THIGH WOUND (Left) Patient reports pain as present . Thigh worse. VAC alarming due to clot.   Objective: Vital signs in last 24 hours: Temp:  [97.8 F (36.6 C)-98.2 F (36.8 C)] 98.1 F (36.7 C) (04/04 0800) Pulse Rate:  [88-127] 101 (04/04 0915) Resp:  [16-96] 18 (04/04 0915) BP: (83-167)/(50-105) 85/50 (04/04 0915) SpO2:  [67 %-100 %] 98 % (04/04 0915) FiO2 (%):  [50 %] 50 % (04/03 1510) Weight:  [70 kg-75.7 kg] 75.7 kg (04/04 0515)  Intake/Output from previous day: 04/03 0701 - 04/04 0700 In: 1512.5 [I.V.:1116.1; IV Piggyback:396.4] Out: 1775 [Urine:1725; Blood:50] Intake/Output this shift: No intake/output data recorded.  Recent Labs    10/27/2020 1612 11/04/2020 1526 10/24/20 0839  HGB 10.3* 7.8* 6.6*   Recent Labs    11/17/2020 1526 10/24/20 0839  WBC 10.3 8.2  RBC 2.80* 2.42*  HCT 25.2* 21.7*  PLT 288 208   Recent Labs    11/17/2020 1526 10/24/20 0839  NA 134* 137  K 2.9* 2.7*  CL 103 103  CO2 24 29  BUN 21* 14  CREATININE 0.69 0.50*  GLUCOSE 264* 200*  CALCIUM 7.5* 7.5*   No results for input(s): LABPT, INR in the last 72 hours.  thigh VAC alarming due to clot DG Chest Portable 1 View  Result Date: 11/15/2020 CLINICAL DATA:  Dyspnea. EXAM: PORTABLE CHEST 1 VIEW COMPARISON:  11/13/2020 FINDINGS: Right IJ central venous catheter unchanged. Lungs are adequately inflated without focal airspace consolidation, effusion or pneumothorax. Cardiomediastinal silhouette and remainder of the exam is unchanged. IMPRESSION: No active disease. Electronically Signed   By: Elberta Fortis M.D.   On: 10/23/2020 14:05    Assessment/Plan: 1 Day Post-Op Procedure(s) (LRB): INCISION AND DRAINAGE  AND DEBRIDEMENT LEFT WRIST AND RIGHT POSTERIOR THIGH APPLICATION OF WOUND VACS TO LEFT WRIST  WOUND AND RIGHT POSTERIOR THIGH WOUND (Left) Plan:    VAC RN to change VAC's.  Albumin 2.1 He has necrosis in posterior thigh but more extensive incision will not heal.  Hip discartic.is aggressive option. Will proceed with ABX and monitor.     Operating room cultures pending from thigh.  He seeded left forearm and may have other areas of infection not apparent at this time.   Blood cultures positive for staph aureus,  Sensitivities pending.   Eldred Manges 10/24/2020, 10:10 AM

## 2020-10-24 NOTE — Consult Note (Addendum)
WOC Nurse Consult Note: Bedside nurse states Vac to right posterior thigh has been alarming "blockage" all night and is not functioning.  Attempted to flush track pad tubing and also cannister filer with NS, but this did not resolve the blockage. Discussed plan of care with Dr Ophelia Charter via phone call and he requested I change the Vac dressings to left arm and right posterior thigh after troubleshooting the machine was not successful. Pt was medicated for pain prior to the procedure and tolerated with mod amt discomfort. Left arm Vac machine has been functioning properly.   Left inner arm with full thickness post-op wound; 4X.3X.3cm, red and moist, small amt pink drainage, no odor or fluctuance.  Sutures intact to outer wound.  Applied one piece black foam to 100 mm cont suction, then covered with ace wrap.  Right posterior thigh with full thickness post-op wound; removed previous piece of black sponge and a large amt thick tan pus began draining from the site. Wound is beefy red; 4X5X7cm when swab was inserted. Erythemia and maceration surrounding to 3 cm.  Applied one piece white foam and one piece black foam.  Bridged track pad to upper hip away from the buttocks. Cont suction on at .  If drainage continues to be thick tan pus, then Vac might become clogged again and nonfunctioning.  Instructed bedside nurse to notify Dr Ophelia Charter for further instructions if this occurs.     Dressing procedure/placement/frequency: WOC to change Vac to left posterior thigh Q M/W/F; use white foam and black foam to cont suction and bridge track pad away from buttocks WOC to change Vac to left arm Q M/W/F; use black foam to cont suction Cammie Mcgee MSN, RN, [place], [place], [place] [phone]

## 2020-10-24 NOTE — Progress Notes (Signed)
Inpatient Diabetes Program Recommendations  AACE/ADA: New Consensus Statement on Inpatient Glycemic Control (2015)  Target Ranges:  Prepandial:   less than 140 mg/dL      Peak postprandial:   less than 180 mg/dL (1-2 hours)      Critically ill patients:  140 - 180 mg/dL   Lab Results  Component Value Date   GLUCAP 201 (H) 10/24/2020   HGBA1C 11.8 (H) 08/18/2019    Review of Glycemic Control  Diabetes history: DM2 Outpatient Diabetes medications: Humalog 16 units TID, Levemir 23 units BID Current orders for Inpatient glycemic control: Lantus 15 units QHS, Novolog 0-9 units TID with meals and 0-5 HS + 4 units TID, metformin 1000 mg BID  Inpatient Diabetes Program Recommendations:     Increase Lantus to 24 units QHS Increase Novolog to 8 units TID with meals if eating > 50%.  Will speak with pt this afternoon regarding his HgbA1C of 11.8% and importance of improving his glucose control.  Needs tighter glycemic control for healing.   Will follow.   Thank you. Ailene Ards, RD, LDN, CDE Inpatient Diabetes Coordinator [phone]

## 2020-10-24 NOTE — Progress Notes (Signed)
eLink Physician-Brief Progress Note Patient Name: Ronald Brady DOB: 1961/03/07 MRN: [id number]   Date of Service  10/24/2020  HPI/Events of Note  Patient asking for a sleep aid, K+ 3.3.  eICU Interventions  Melatonin 3 mg PO Q HS PRN insomnia x 3 doses. KCL 10 meq iv Q 1 hour x 4 doses.        Migdalia Dk 10/24/2020, 9:41 PM

## 2020-10-24 NOTE — Progress Notes (Signed)
PT Cancellation Note  Patient Details Name: Ronald Brady MRN: [id number] DOB: 1961/04/23   Cancelled Treatment:    Reason Eval/Treat Not Completed: Patient at procedure/dressing changes. Will check back later.Rada Hay 10/24/2020, 10:12 AM  Blanchard Kelch PT Acute Rehabilitation Services Pager [phone] Office [phone]

## 2020-10-25 ENCOUNTER — Inpatient Hospital Stay (HOSPITAL_COMMUNITY): Payer: Medicaid Other

## 2020-10-25 DIAGNOSIS — N179 Acute kidney failure, unspecified: Secondary | ICD-10-CM

## 2020-10-25 DIAGNOSIS — E1169 Type 2 diabetes mellitus with other specified complication: Secondary | ICD-10-CM

## 2020-10-25 DIAGNOSIS — L02415 Cutaneous abscess of right lower limb: Secondary | ICD-10-CM | POA: Diagnosis not present

## 2020-10-25 DIAGNOSIS — L02414 Cutaneous abscess of left upper limb: Secondary | ICD-10-CM | POA: Diagnosis not present

## 2020-10-25 DIAGNOSIS — L8990 Pressure ulcer of unspecified site, unspecified stage: Secondary | ICD-10-CM | POA: Insufficient documentation

## 2020-10-25 DIAGNOSIS — A419 Sepsis, unspecified organism: Secondary | ICD-10-CM | POA: Diagnosis not present

## 2020-10-25 DIAGNOSIS — Z794 Long term (current) use of insulin: Secondary | ICD-10-CM

## 2020-10-25 LAB — OCCULT BLOOD X 1 CARD TO LAB, STOOL: Fecal Occult Bld: POSITIVE — AB

## 2020-10-25 LAB — GLUCOSE, CAPILLARY
Glucose-Capillary: 160 mg/dL — ABNORMAL HIGH (ref 70–99)
Glucose-Capillary: 184 mg/dL — ABNORMAL HIGH (ref 70–99)
Glucose-Capillary: 209 mg/dL — ABNORMAL HIGH (ref 70–99)
Glucose-Capillary: 215 mg/dL — ABNORMAL HIGH (ref 70–99)
Glucose-Capillary: 254 mg/dL — ABNORMAL HIGH (ref 70–99)
Glucose-Capillary: 255 mg/dL — ABNORMAL HIGH (ref 70–99)

## 2020-10-25 LAB — BASIC METABOLIC PANEL
Anion gap: 5 (ref 5–15)
BUN: 16 mg/dL (ref 6–20)
CO2: 29 mmol/L (ref 22–32)
Calcium: 7.5 mg/dL — ABNORMAL LOW (ref 8.9–10.3)
Chloride: 104 mmol/L (ref 98–111)
Creatinine, Ser: 0.52 mg/dL — ABNORMAL LOW (ref 0.61–1.24)
GFR, Estimated: >60 mL/min (ref >60)
Glucose, Bld: 222 mg/dL — ABNORMAL HIGH (ref 70–99)
Potassium: 3.7 mmol/L (ref 3.5–5.1)
Sodium: 138 mmol/L (ref 135–145)

## 2020-10-25 LAB — CULTURE, BLOOD (ROUTINE X 2): Special Requests: ADEQUATE

## 2020-10-25 LAB — CBC WITH DIFFERENTIAL/PLATELET
Abs Immature Granulocytes: 0.08 10*3/uL — ABNORMAL HIGH (ref 0.00–0.07)
Basophils Absolute: 0 10*3/uL (ref 0.0–0.1)
Basophils Relative: 0 %
Eosinophils Absolute: 0 10*3/uL (ref 0.0–0.5)
Eosinophils Relative: 0 %
HCT: 19.4 % — ABNORMAL LOW (ref 39.0–52.0)
Hemoglobin: 5.9 g/dL — CL (ref 13.0–17.0)
Immature Granulocytes: 1 %
Lymphocytes Relative: 22 %
Lymphs Abs: 1.8 10*3/uL (ref 0.7–4.0)
MCH: 27.6 pg (ref 26.0–34.0)
MCHC: 30.4 g/dL (ref 30.0–36.0)
MCV: 90.7 fL (ref 80.0–100.0)
Monocytes Absolute: 0.3 10*3/uL (ref 0.1–1.0)
Monocytes Relative: 4 %
Neutro Abs: 6.1 10*3/uL (ref 1.7–7.7)
Neutrophils Relative %: 73 %
Platelets: 166 10*3/uL (ref 150–400)
RBC: 2.14 MIL/uL — ABNORMAL LOW (ref 4.22–5.81)
RDW: 15.9 % — ABNORMAL HIGH (ref 11.5–15.5)
WBC: 8.4 10*3/uL (ref 4.0–10.5)
nRBC: 0 % (ref 0.0–0.2)

## 2020-10-25 LAB — PROLACTIN: Prolactin: 9.4 ng/mL (ref 4.0–15.2)

## 2020-10-25 LAB — CBC
HCT: 25 % — ABNORMAL LOW (ref 39.0–52.0)
Hemoglobin: 7.9 g/dL — ABNORMAL LOW (ref 13.0–17.0)
MCH: 28.2 pg (ref 26.0–34.0)
MCHC: 31.6 g/dL (ref 30.0–36.0)
MCV: 89.3 fL (ref 80.0–100.0)
Platelets: 180 10*3/uL (ref 150–400)
RBC: 2.8 MIL/uL — ABNORMAL LOW (ref 4.22–5.81)
RDW: 15.6 % — ABNORMAL HIGH (ref 11.5–15.5)
WBC: 8.7 10*3/uL (ref 4.0–10.5)
nRBC: 0 % (ref 0.0–0.2)

## 2020-10-25 LAB — PREPARE RBC (CROSSMATCH)

## 2020-10-25 MED ORDER — POTASSIUM CHLORIDE 20 MEQ PO PACK
20.0000 meq | PACK | Freq: Once | ORAL | Status: AC
  Administered 2020-10-25: 20 meq via ORAL
  Filled 2020-10-25: qty 1

## 2020-10-25 MED ORDER — INSULIN GLARGINE 100 UNIT/ML ~~LOC~~ SOLN
20.0000 [IU] | Freq: Every day | SUBCUTANEOUS | Status: DC
  Filled 2020-10-25: qty 0.2

## 2020-10-25 MED ORDER — POVIDONE-IODINE 10 % EX SWAB
2.0000 "application " | Freq: Once | CUTANEOUS | Status: AC
  Administered 2020-10-27: 2 via TOPICAL

## 2020-10-25 MED ORDER — CHLORHEXIDINE GLUCONATE 4 % EX LIQD
60.0000 mL | Freq: Once | CUTANEOUS | Status: AC
  Administered 2020-10-27: 4 via TOPICAL
  Filled 2020-10-25: qty 60

## 2020-10-25 MED ORDER — CHLORHEXIDINE GLUCONATE 4 % EX LIQD: 60.0000 mL | Freq: Once | CUTANEOUS | Status: DC

## 2020-10-25 MED ORDER — SODIUM CHLORIDE 0.9% IV SOLUTION: Freq: Once | INTRAVENOUS | Status: AC

## 2020-10-25 MED ORDER — POVIDONE-IODINE 10 % EX SWAB: 2.0000 "application " | Freq: Once | CUTANEOUS | Status: DC

## 2020-10-25 MED ORDER — HEPARIN SODIUM (PORCINE) 5000 UNIT/ML IJ SOLN
5000.0000 [IU] | Freq: Three times a day (TID) | INTRAMUSCULAR | Status: DC
  Administered 2020-10-25: 5000 [IU] via SUBCUTANEOUS
  Filled 2020-10-25: qty 1

## 2020-10-25 MED ORDER — CEFAZOLIN SODIUM-DEXTROSE 2-4 GM/100ML-% IV SOLN: 2.0000 g | INTRAVENOUS | Status: AC

## 2020-10-25 NOTE — Progress Notes (Signed)
NAME:  Ronald Brady, MRN:  [id number], DOB:  Mar 05, 1961, LOS: 3 ADMISSION DATE:  11/03/2020, CONSULTATION DATE:  4/3 REFERRING MD:  [facility], CHIEF COMPLAINT:  sepsis   History of Present Illness:  60 y/o M, smoker, with poorly controlled DM who presented to [facility] 4/2 after being found at home in a chair covered in urine & feces.  The patient was seen 09/28/20 by Ortho for a hamstring tear.  He was found in his home having sat in a chair for four days incontinent of urine / feces.  He was found to have hypotension which responded to IVF, noted to have an abscess on his upper posterior right leg and left wrist with exposed tendon. I&D was completed in the ER by EDP. CT of the right femur showed elongated gas and fluid collection along the surface of the hamstring muscles extending deep into the hamstring muscle compatible with intramuscular abscess. Clindamycin was initiated. He required insulin gtt for DKA. He was transferred 4/3 to [facility] for further care. The patient required I&D per Ortho with VAC placement on right thigh and left wrist.  Pertinent  Medical History  Tobacco Abuse - began smoking at 67 Worked as a logger  Poorly controlled DM - Hgb A1c 11.8 in 07/2019  HTN   Significant Hospital Events: Including procedures, antibiotic start and stop dates in addition to other pertinent events    4/02 presented to ER in shock due to multiple abscess (right thigh, left wrist), DKA. CT femur found elongated gas and fluid collection in the right hamstring c/w abscess. No evidence of osteomyelitis. Treated with clinda, meropenem, cultured, central line placed. I&D in ER.   4/03 Wrist CT with gas throughout the SQ tissues in the anterior distal forearm. Ortho consulted > taken to OR for debridement of right posterior hamstring and left forearm. Intraoperative cultures obtained. Cultures growing staph aureus (sensitivities pending). Vanco added but abx narrowed to cefazolin late pm  4/04 Ortho note  reflects concern for poor wound healing, may need hip disarticulation. On levophed 1-2 mcg, BCID with staph aureus, UC 100k staph aureus.  ECHO 60-65% EF, no evidence of vegetation mentioned  Interim History / Subjective:  Afebrile / WBC 8.7 On RA  Glucose range 209-222 I/O UOP, +2.8L in last 24 hours RN reports Ortho saw patient and planning for re-imaging & to take patient back to OR 4/6 Pt reports he is concerned about his leg, hoping it will heal  Objective   Blood pressure 138/73, pulse (!) 103, temperature 98.3 F (36.8 C), temperature source Oral, resp. rate (!) 27, height 5\' 5"  (1.651 m), weight 75.7 kg, SpO2 96 %. CVP:  [8 mmHg-9 mmHg] 9 mmHg      Intake/Output Summary (Last 24 hours) at 10/25/2020 0851 Last data filed at 10/25/2020 0600 Gross per 24 hour  Intake 3749.13 ml  Output 900 ml  Net 2849.13 ml   Filed Weights   11/08/2020 1551 15-Nov-2020 1510 10/24/20 0515  Weight: 68 kg 70 kg 75.7 kg    Examination: General: chronically ill appearing adult male lying in bed in NAD  HEENT: MM pink/moist, anicteric  Neuro: AAOx4, speech clear, MAE  CV: s1s2 RRR, no m/r/g PULM: non-labored on [place] O2, lungs bilaterally coarse GI: soft, bsx4 active  Extremities: warm/dry, no significant edema. Left wrist wrapped in ACE wrap with VAC in place, right thigh wrapped in ACE wrap with VAC in place  Skin: multiple small abrasions on forearms  Resolved Hospital Problem list     Assessment & Plan:   Septic Shock secondary MSSA Abscess of R Thigh, L Wrist with MSSA Bacteremia  -continue LR at 50 ml/hr -continue levophed for MAP >65 -cefazolin for MSSA bacteremia -appreciate ID, Ortho evaluation  -concern for poor wound healing with poor albumin -repeat CT Femur to assess for further pockets of infection per Ortho  -follow cultures to maturity  -PRN pain control with bowel regimen  -TTE without evidence of endocarditis, may need TEE if repeat cultures do not  clear  Poorly Controlled DM with Hyperglycemia  Hyperglycemia on admit with glucose >900, small ketones in urine.  Hgb A1c 12.9 -SSI, ACHS  -continue meal coverage with 4 untis TID  -increase lantus to 20 units QHS  -continue metformin  Hypokalemia  -follow electrolytes, replace as indicated  -KCL 20 mEq PO x1   Anemia  Suspect in setting of oozing / wound blood loss  -follow CBC -monitor for further bleeding  -resume SQ heparin 4/5 for DVT prophylaxis   Active Smoker, Suspected COPD / Asthmatic Bronchitis -dulera + duoneb  -wean O2 off for sats >90% -pulmonary hygiene -IS, mobilize  -will need outpatient eval for COPD -smoking cessation counseling   Best practice (right click and "Reselect all SmartList Selections" daily)  Diet:  Oral Pain/Anxiety/Delirium protocol (if indicated): No VAP protocol (if indicated): Not indicated DVT prophylaxis: Subcutaneous Heparin and SCD GI prophylaxis: PPI Glucose control:  SSI Yes Central venous access:  Yes, and it is still needed Arterial line:  N/A Foley:  Yes, and it is still needed Mobility:  bed rest  PT consulted: N/A Last date of multidisciplinary goals of care discussion n/a Code Status:  full code Disposition: ICU     Critical Care Time: 33 minutes   Canary Brim, MSN, APRN, NP-C, AGACNP-BC [facility] 10/25/2020, 8:51 AM   Please see [email or web address] for pager details.   From 7A-7P if no response, please call [phone] After hours, please call ELink [phone]

## 2020-10-25 NOTE — Evaluation (Signed)
Occupational Therapy Evaluation Patient Details Name: Ronald Brady MRN: [id number] DOB: 06/13/1961 Today's Date: 10/25/2020    History of Present Illness 60 y.o. male presenting with to ED after being found by his sister sitting in a chair x4 days covered in excrement. Patient found to have abscess on R proximal posterior upper leg s/p I&D and wound vac placement. Patient also found to have L proximal wrist wound and wound on ulnar aspect of wrist both with drainage and foul odor. Patient reports R hamstring injury x several weeks. Patient admitted with severe septic shock 2/2 fascciitis, AKI, hyperosmolar hyperglycemic state and UTI. PMHx significant for uncontrolled DMII, emphysema, HTN, and tobacco use.   Clinical Impression   PTA patient was living alone in a mobile home with 3 STE and was grossly I with ADLs/IADLs without AD. Patient was driving and preparing meals for himself. Patient reports increased difficulty with mobility s/p R hamstring injury. Patient reports 1 fall a few weeks ago while attempting to use crutches for household mobility and expressed desire to mobilize with RW when able. Evaluation limited 2/2 patient reporting 8/10 pain in R thigh declining all EOB/OOB activity including repositioning of RLE which patient keeps externally rotated at the hip and flexed at the knee. Patient reports fear of re-injury despite education on need for mobility. Patient currently functioning below baseline limited by deficits listed below 2/2 diagnosis above and would benefit from continued acute OT services to maximize safety and independence with self-care tasks and decrease caregiver burden. Given patient CLOF and decreased caregiver support, recommendation for SNF rehab. OT will continue to follow acutely.     Follow Up Recommendations  SNF;Supervision/Assistance - 24 hour    Equipment Recommendations  Other (comment) (TBD)    Recommendations for Other Services       Precautions /  Restrictions Precautions Precautions: Fall Precaution Comments: wound vac right leg and left arm, Flexiseal, does not want right leg touched Restrictions Weight Bearing Restrictions: No      Mobility Bed Mobility               General bed mobility comments: Refused    Transfers                 General transfer comment: Refused    Balance                                           ADL either performed or assessed with clinical judgement   ADL Overall ADL's : Needs assistance/impaired     Grooming: Minimal assistance;Bed level Grooming Details (indicate cue type and reason): Washed face in supine. Declined EOB/OOB activity 2/2 pain.             Lower Body Dressing: Total assistance Lower Body Dressing Details (indicate cue type and reason): Total A to don footwear in supine.               General ADL Comments: Patient limited by severe pain in L thigh and R arm. Patient also limited by decreased activity tolernace, generalized weakness/debility, and need for external assist for completion of ADLs.     Vision   Vision Assessment?: No apparent visual deficits Additional Comments: Able to correctly identify time on wall clock.     Perception     Praxis      Pertinent Vitals/Pain Pain Assessment: 0-10 Pain Score:  8  Pain Location: R posterior thigh and L shoulder Pain Descriptors / Indicators: Aching;Tightness;Throbbing;Grimacing;Guarding Pain Intervention(s): Limited activity within patient's tolerance;Monitored during session;Premedicated before session;Other (comment) (Patient refused repositioning. Maintains RLE flexed and externally rotated.)     Hand Dominance Right   Extremity/Trunk Assessment Upper Extremity Assessment Upper Extremity Assessment: RUE deficits/detail;LUE deficits/detail RUE Deficits / Details: AROM WFL. MMT grossly 3+/5. RUE Sensation: WNL RUE Coordination: WNL LUE Deficits / Details: AROM grossly  WFL. Did not formally assess strength 2/2 pain in R wrist. LUE: Unable to fully assess due to pain;Unable to fully assess due to immobilization (Ace bandage holding wound vac in place.) LUE Sensation: WNL LUE Coordination: decreased fine motor;decreased gross motor   Lower Extremity Assessment Lower Extremity Assessment: Defer to PT evaluation       Communication Communication Communication: No difficulties   Cognition Arousal/Alertness: Awake/alert Behavior During Therapy: WFL for tasks assessed/performed Overall Cognitive Status: Impaired/Different from baseline Area of Impairment: Orientation;Awareness;Problem solving                 Orientation Level: Disoriented to;Time         Awareness: Emergent Problem Solving: Slow processing General Comments: Patient A&O to person, place (does not know name of hospital) and situation. Requries increased time to process verbal information. Decreased problem solving. States that he wants to get bette but declines all EOB/OOB activity this date 2/2 pain.   General Comments       Exercises     Shoulder Instructions      Home Living Family/patient expects to be discharged to:: Private residence Living Arrangements: Alone Available Help at Discharge: Family;Available PRN/intermittently (Daughter lives next door (strained relationship), sister checks on patient from time to time) Type of Home: Mobile home Home Access: Stairs to enter Secretary/administrator of Steps: 3   Home Layout: One level         Firefighter: Standard     Home Equipment: Crutches          Prior Functioning/Environment Level of Independence: Independent with assistive device(s)        Comments: Independent with ADLs/IADLs; drives; recent difficulty with mobility 2/2 hamstring injury. Reports 1 fall several weeks ago while trying to use crutches.        OT Problem List: Decreased strength;Decreased range of motion;Decreased activity  tolerance;Impaired balance (sitting and/or standing);Decreased coordination;Decreased cognition;Decreased safety awareness;Decreased knowledge of use of DME or AE;Impaired UE functional use;Pain      OT Treatment/Interventions: Self-care/ADL training;Therapeutic exercise;Energy conservation;DME and/or AE instruction;Therapeutic activities;Patient/family education;Balance training    OT Goals(Current goals can be found in the care plan section) Acute Rehab OT Goals Patient Stated Goal: To decrease pain. OT Goal Formulation: With patient Time For Goal Achievement: 11/08/20 Potential to Achieve Goals: Fair ADL Goals Pt Will Perform Eating: with set-up;sitting Pt Will Perform Grooming: with set-up;sitting Pt Will Perform Upper Body Dressing: with set-up;sitting Pt Will Perform Lower Body Dressing: with min assist;sit to/from stand Pt Will Transfer to Toilet: stand pivot transfer;bedside commode;with min assist Pt Will Perform Toileting - Clothing Manipulation and hygiene: with min assist;sit to/from stand;sitting/lateral leans Additional ADL Goal #1: Patient will tolerate 1 set x 10 reps each of BUE HEP to improve strength in prep for ADLs.  OT Frequency: Min 2X/week   Barriers to D/C: Decreased caregiver support  Lives alone       Co-evaluation   Reason for Co-Treatment: For patient/therapist safety PT goals addressed during session: Mobility/safety with mobility OT goals addressed during  session: ADL's and self-care      AM-PAC OT "6 Clicks" Daily Activity     Outcome Measure Help from another person eating meals?: A Little Help from another person taking care of personal grooming?: A Little Help from another person toileting, which includes using toliet, bedpan, or urinal?: Total Help from another person bathing (including washing, rinsing, drying)?: Total Help from another person to put on and taking off regular upper body clothing?: A Lot Help from another person to put on and  taking off regular lower body clothing?: Total 6 Click Score: 11   End of Session Nurse Communication: Mobility status  Activity Tolerance: Patient limited by pain Patient left: in bed;with call bell/phone within reach;with bed alarm set  OT Visit Diagnosis: Unsteadiness on feet (R26.81);Other abnormalities of gait and mobility (R26.89);Muscle weakness (generalized) (M62.81);History of falling (Z91.81);Pain Pain - Right/Left:  (R and L) Pain - part of body: Shoulder;Arm;Hand;Leg                Time: 1610-9604 OT Time Calculation (min): 20 min Charges:  OT General Charges $OT Visit: 1 Visit OT Evaluation $OT Eval Moderate Complexity: 1 Mod  Demarie Hyneman H. OTR/L Supplemental OT, Department of rehab services [phone]  Daionna Crossland R H. 10/25/2020, 2:27 PM

## 2020-10-25 NOTE — Progress Notes (Signed)
  Speech Language Pathology Treatment: Dysphagia  Patient Details Name: Ronald Brady MRN: [id number] DOB: 1961/03/18 Today's Date: 10/25/2020 Time: 1540-0867 SLP Time Calculation (min) (ACUTE ONLY): 37 min  Assessment / Plan / Recommendation Clinical Impression  Pt was to undergo MBS today however SLP informed re: concerns for potential positioning issues with his seeping wound (upper right leg) impairing positioning.    Pt interviewed re: premorbid dysphagia and upon review of chart, he was intubated for 3 days in 2021 due to epiglottitis which has resolved.  He does admit to occasionally coughing with drink more than food and coughing up secretions once a week or every 2 weeks.  Voice today is strong and doesn't clinically appear hoarse- Pt reports voice is at baseline - the pt still smokes - which is likely source of perceptual mild hoarseness.    Pt observed consuming graham crackers, applesauce and cranberry juice.  He is edentulous but was able to "masticate" adequately.   Cough x1 only after swallowing one of 10 bites of cracker noted.  With verbal cough to cough and expectorate, he cleared viscous secretions - no food mixed with from observation.  Per pt, he eats without dentures at home - although he does have them.    Pt clinically presents with strong cough and adequate "mastication" with soft solids with intact ability to cough and expectorate secretions prn.  Recommend to advance diet to dys3/thin. SLP will follow up to assure tolerance and conduct MBS if indicated.  Pt initially was disappointed in diet recommendation but with further explanation he was satisfied.  Using teach back and moderate verbal cue, pt able to identify reasoning for swallow precautions and specifics. Provided information in writing and verbally.      HPI HPI: Patient is a 60 y.o. male with PMH: DM, HTN, emphysema who was brought to ED via EMS after he was found by his sister at home. Patient had been sitting  i a chair for four days unable to get up and patient reported he had torn hamstring which prevented him from getting up from chair. In addition, he apparently had not been eating anything or taking his insulin. On admission, he was hypothermic temperature 95.7, tachycardic, hypotensive BP, UA with moderate leukocytes, yeast, draining abcess noted on right thigh and left wrist, CT right femur showed elongated gas and fluid collection along surface of hamstring muscles extending deep into hamstring muscle compatible with intramuscular abcess. He had MBS in 2021 secondary to suspected post-extubation dysphagia and at that time had been started on puree solids and pudding thick liquids.      SLP Plan  Continue with current plan of care       Recommendations  Diet recommendations: Dysphagia 3 (mechanical soft);Thin liquid Liquids provided via: Straw Medication Administration: Crushed with puree Compensations: Minimize environmental distractions;Slow rate;Small sips/bites Postural Changes and/or Swallow Maneuvers: Seated upright 90 degrees;Upright 30-60 min after meal                Oral Care Recommendations: Oral care BID Follow up Recommendations: Other (comment) (TBD) SLP Visit Diagnosis: Dysphagia, unspecified (R13.10) Plan: Continue with current plan of care       GO                Chales Abrahams 10/25/2020, 2:53 PM  Rolena Infante, MS [facility] SLP Acute Rehab Services Office [phone] Pager [phone]

## 2020-10-25 NOTE — Progress Notes (Signed)
eLink Physician-Brief Progress Note Patient Name: Ronald Brady DOB: October 10, 1960 MRN: [id number]   Date of Service  10/25/2020  HPI/Events of Note  Nursing reports bloody BM and request CBC and stool card for occult blood.   eICU Interventions  Will order CBC with platelets and stool card for occult blood now.      Intervention Category Major Interventions: Other:  Lenell Antu 10/25/2020, 7:53 PM

## 2020-10-25 NOTE — Progress Notes (Addendum)
Patient has soft dressing over his wrist and VAC has been DC'd.  There continues to be brownish pus draining from the  [facility] from his posterior thigh where he had torn hamstring and then developed infection that extends almost down to the knee.  Problem is with low albumin plus diabetes that his entire posterior compartment would likely have to be debrided with problems with the sciatic nerve.  He is unlikely to heal with low albumin.  Fortunately his growing MSSA which causes less tissue necrosis.  Discussed with patient would recommend reimaging and then repeat surgery with repeat washout.  Possibly making a second more distal incision placing a VAC at that area.  I discussed with the patient that if we open the entire posterior thigh he likely will end up with a hip disarticulation.  He said for many days with an infected hematoma after tearing his hamstring and has significant infection involvement in the posterior thigh.  Have posted him for surgery tomorrow possibly at around 4 PM for repeat washout of his thigh and I will also do a dressing change of his left forearm at that time.  Any new areas of loculation identified on the CT scan will be areas that we try to drain with tomorrow surgery.  I discussed with patient that he may have other areas of infection in his body where there are bacteria that have not become symptomatic.  Patient continues on appropriate antibiotics per ID team.  Patient understands and agrees to proceed.

## 2020-10-25 NOTE — Progress Notes (Signed)
eLink Physician-Brief Progress Note Patient Name: Ronald Brady DOB: [birth year] MRN: [id number]   Date of Service  10/25/2020  HPI/Events of Note  Patient having copious diarrhea that is starting to seep into the wound vac area.  eICU Interventions  Flexiseal ordered.        Thomasene Lot Rosmary Dionisio 10/25/2020, 6:44 AM

## 2020-10-25 NOTE — Progress Notes (Signed)
eLink Physician-Brief Progress Note Patient Name: Ronald Brady DOB: 1960/11/05 MRN: [id number]   Date of Service  10/25/2020  HPI/Events of Note  Rectal bleeding - Hgb = 5.9. Patient on Heparin [place] and SCD's.  eICU Interventions  Plan: 1. Transfuse 2 units PRBC. 2. D/C Heparin [place]. 3. Repeat H/H at 2 AM.      Intervention Category Major Interventions: Other:  Lenell Antu 10/25/2020, 8:55 PM

## 2020-10-25 NOTE — Consult Note (Addendum)
WOC Nurse wound follow up Bedside nurse is a WTA and changed right posterior thigh Vac dressing this am beacuse the drape the seal was impaired with leaking stool. Vac cannister remains with mod amt thick tan pus from this location. She is planning to change left arm Vac dressing also since the track pad has accidentally been pulled off. Vac dressings will plan to be changed again on Fri. Cammie Mcgee MSN, RN, CWOCN, [place], CNS [phone]

## 2020-10-25 NOTE — Evaluation (Signed)
Physical Therapy Evaluation Patient Details Name: Ronald Brady MRN: [id number] DOB: 03/08/1961 Today's Date: 10/25/2020   History of Present Illness  60 y.o. male presenting with to ED after being found by his sister sitting in a chair x4 days covered in excrement. Patient found to have abscess on R proximal posterior upper leg s/p I&D and wound vac placement. Patient also found to have L proximal wrist wound and wound on ulnar aspect of wrist .. Patient reports R hamstring injury x several weeks. Patient admitted with severe septic shock 2/2 fascciitis, AKI, hyperosmolar hyperglycemic state and UTI. PMHx significant for uncontrolled DMII, emphysema, HTN, and tobacco use.  Clinical Impression  Patient limited in  Evaluation die to reports of pain of  Right thigh. Patient is in bed with RLE external rotation and knee flexed to 90* +. Patient  Would not attempt nor allow the right leg to be moved. No further mobility evaluation was attempted as patient was very anxious and vocal about not moving APT will follow  As patient able to tolerate. Pt admitted with above diagnosis.  Pt currently with functional limitations due to the deficits listed below (see PT Problem List). Pt will benefit from skilled PT to increase their independence and safety with mobility to allow discharge to the venue listed below.       Follow Up Recommendations SNF    Equipment Recommendations  Wheelchair cushion (measurements PT);Wheelchair (measurements PT)    Recommendations for Other Services       Precautions / Restrictions Precautions Precautions: Fall Precaution Comments: wound vac right leg and left arm, Flexiseal, does not want right leg touched Restrictions Weight Bearing Restrictions: No      Mobility  Bed Mobility               General bed mobility comments: Refused, sitting in bed uproght, HOB raised    Transfers                 General transfer comment:  Refused  Ambulation/Gait                Stairs            Wheelchair Mobility    Modified Rankin (Stroke Patients Only)       Balance                                             Pertinent Vitals/Pain Pain Assessment: Faces Pain Score: 8  Faces Pain Scale: Hurts whole lot Pain Location: right posterior thigh Pain Descriptors / Indicators: Aching;Tightness;Throbbing;Grimacing;Guarding Pain Intervention(s): Monitored during session;Premedicated before session    Home Living Family/patient expects to be discharged to:: Private residence Living Arrangements: Alone Available Help at Discharge: Family;Available PRN/intermittently (Daughter lives next door (strained relationship), sister checks on patient from time to time) Type of Home: Mobile home Home Access: Stairs to enter   Secretary/administrator of Steps: 3 Home Layout: One level Home Equipment: Crutches      Prior Function Level of Independence: Independent with assistive device(s)         Comments: Independent with ADLs/IADLs; drives; recent difficulty with mobility 2/2 hamstring injury. Reports 1 fall several weeks ago while trying to use crutches.     Hand Dominance   Dominant Hand: Right    Extremity/Trunk Assessment   Upper Extremity Assessment Upper Extremity Assessment: Defer to OT evaluation  RUE Deficits / Details: AROM WFL. MMT grossly 3+/5. RUE Sensation: WNL RUE Coordination: WNL LUE Deficits / Details: AROM grossly WFL. Did not formally assess strength 2/2 pain in R wrist. LUE: Unable to fully assess due to pain;Unable to fully assess due to immobilization (Ace bandage holding wound vac in place.) LUE Sensation: WNL LUE Coordination: decreased fine motor;decreased gross motor    Lower Extremity Assessment Lower Extremity Assessment: RLE deficits/detail;LLE deficits/detail RLE Deficits / Details: leg is positioned in ER and flexed at knee at least 90+ degrees,  patient would not attempt to allow any knee extension. Patient reports that  he has a hamstring rupture.       Communication   Communication: No difficulties  Cognition Arousal/Alertness: Awake/alert Behavior During Therapy: WFL for tasks assessed/performed;Anxious Overall Cognitive Status: Impaired/Different from baseline Area of Impairment: Orientation;Awareness;Problem solving                 Orientation Level: Disoriented to;Time         Awareness: Emergent Problem Solving: Slow processing General Comments: Patient A&O to person, place (does not know name of hospital) and situation. Requries increased time to process verbal information. Decreased problem solving. States that he wants to get bette but declines all EOB/OOB activity this date 2/2 pain.      General Comments      Exercises     Assessment/Plan    PT Assessment Patient needs continued PT services  PT Problem List Decreased strength;Decreased mobility;Decreased range of motion;Decreased knowledge of precautions;Decreased activity tolerance;Decreased cognition;Decreased balance;Pain       PT Treatment Interventions DME instruction;Therapeutic activities;Gait training;Therapeutic exercise;Patient/family education;Functional mobility training;Wheelchair mobility training    PT Goals (Current goals can be found in the Care Plan section)  Acute Rehab PT Goals Patient Stated Goal: To decrease pain. PT Goal Formulation: With patient Time For Goal Achievement: 11/08/20 Potential to Achieve Goals: Fair    Frequency Min 2X/week   Barriers to discharge Decreased caregiver support      Co-evaluation PT/OT/SLP Co-Evaluation/Treatment: Yes Reason for Co-Treatment: For patient/therapist safety PT goals addressed during session: Mobility/safety with mobility OT goals addressed during session: ADL's and self-care       AM-PAC PT "6 Clicks" Mobility  Outcome Measure Help needed turning from your back to  your side while in a flat bed without using bedrails?: Total Help needed moving from lying on your back to sitting on the side of a flat bed without using bedrails?: Total Help needed moving to and from a bed to a chair (including a wheelchair)?: Total Help needed standing up from a chair using your arms (e.g., wheelchair or bedside chair)?: Total Help needed to walk in hospital room?: Total Help needed climbing 3-5 steps with a railing? : Total 6 Click Score: 6    End of Session   Activity Tolerance: Patient limited by pain Patient left: in bed;with call bell/phone within reach Nurse Communication: Mobility status PT Visit Diagnosis: Unsteadiness on feet (R26.81);Pain;Difficulty in walking, not elsewhere classified (R26.2) Pain - Right/Left: Right Pain - part of body: Leg    Time: 1779-3903 PT Time Calculation (min) (ACUTE ONLY): 16 min   Charges:   PT Evaluation $PT Eval Low Complexity: 1 Low          Blanchard Kelch PT Acute Rehabilitation Services Pager [phone] Office [phone]   Rada Hay 10/25/2020, 4:10 PM

## 2020-10-25 NOTE — Progress Notes (Signed)
Entered patient room at approximately 1945 to find patient covered in stool that appeared to have bright red blood in it. GI bleed is a new finding during this patients stay. elink was notified of finding and appropriate orders placed by provider. Awaiting blood transfusion once preparation is complete in lab. Heparin DC'd, and colace held. No other orders at this time. Will continue to assess for change in status and condition, vital signs currently stable.

## 2020-10-25 NOTE — Progress Notes (Signed)
Chaplain engaged in an initial visit with Ronald Brady.  Chaplain offered support and will follow-up.    10/25/20 1200  Clinical Encounter Type  Visited With Patient  Visit Type Initial

## 2020-10-25 NOTE — Progress Notes (Signed)
Inpatient Diabetes Program Recommendations  AACE/ADA: New Consensus Statement on Inpatient Glycemic Control (2015)  Target Ranges:  Prepandial:   less than 140 mg/dL      Peak postprandial:   less than 180 mg/dL (1-2 hours)      Critically ill patients:  140 - 180 mg/dL   Lab Results  Component Value Date   GLUCAP 215 (H) 10/25/2020   HGBA1C 12.9 (H) 10/24/2020    Review of Glycemic Control  Spoke with pt yesterday afternoon about his diabetes and HgbA1C of 12.9%. Pt states his PCP/Endo recently took him off one of his insulins. He said he only takes Lantus, but did not know the dose. Used to take Humalog, although was not taking 3x/day. Seemed confused about how much insulin he should be taking. Checks his blood sugars approx 2x/day and states they're usually in the 300-500s. Denies any hypoglycemia. Lives alone. [place] Spaniel he sees his doctor at [facility] every 2-3 months.   CBGs 4/4: 201-245 mg/dL CBGs 5/5: 347-425 mg/dL  Needs insulin titration. On FL diet  Orders: Lantus 15 units QHS Novolog 0-9 units TID with meals and 0-5 HS + 4 units TID  Inpatient Diabetes Program Recommendations:     Increase Lantus to 20 units QHS Increase Novolog to 6 units TID with meals if eating > 50% meal.  Pt has been getting confused at home regarding which insulin he should use. Will try to contact his Endo in [place] to see what was ordered for him.  Follow closely.  Thank you. Ailene Ards, RD, LDN, CDE Inpatient Diabetes Coordinator [phone]

## 2020-10-26 ENCOUNTER — Inpatient Hospital Stay (HOSPITAL_COMMUNITY): Payer: Medicaid Other

## 2020-10-26 DIAGNOSIS — R6521 Severe sepsis with septic shock: Secondary | ICD-10-CM | POA: Diagnosis not present

## 2020-10-26 DIAGNOSIS — A419 Sepsis, unspecified organism: Secondary | ICD-10-CM | POA: Diagnosis not present

## 2020-10-26 DIAGNOSIS — L02414 Cutaneous abscess of left upper limb: Secondary | ICD-10-CM | POA: Diagnosis not present

## 2020-10-26 DIAGNOSIS — R7881 Bacteremia: Secondary | ICD-10-CM | POA: Diagnosis not present

## 2020-10-26 DIAGNOSIS — L02415 Cutaneous abscess of right lower limb: Secondary | ICD-10-CM | POA: Diagnosis not present

## 2020-10-26 LAB — URINE CULTURE: Culture: >=100000 — AB

## 2020-10-26 LAB — GLUCOSE, CAPILLARY
Glucose-Capillary: 321 mg/dL — ABNORMAL HIGH (ref 70–99)
Glucose-Capillary: 282 mg/dL — ABNORMAL HIGH (ref 70–99)
Glucose-Capillary: 284 mg/dL — ABNORMAL HIGH (ref 70–99)
Glucose-Capillary: 196 mg/dL — ABNORMAL HIGH (ref 70–99)
Glucose-Capillary: 189 mg/dL — ABNORMAL HIGH (ref 70–99)

## 2020-10-26 LAB — CBC
HCT: 21.9 % — ABNORMAL LOW (ref 39.0–52.0)
Hemoglobin: 7.3 g/dL — ABNORMAL LOW (ref 13.0–17.0)
MCH: 28.7 pg (ref 26.0–34.0)
MCHC: 33.3 g/dL (ref 30.0–36.0)
MCV: 86.2 fL (ref 80.0–100.0)
Platelets: 145 10*3/uL — ABNORMAL LOW (ref 150–400)
RBC: 2.54 MIL/uL — ABNORMAL LOW (ref 4.22–5.81)
RDW: 15.6 % — ABNORMAL HIGH (ref 11.5–15.5)
WBC: 12.8 10*3/uL — ABNORMAL HIGH (ref 4.0–10.5)
nRBC: 0 % (ref 0.0–0.2)

## 2020-10-26 LAB — COMPREHENSIVE METABOLIC PANEL
ALT: 7 U/L (ref 0–44)
AST: 11 U/L — ABNORMAL LOW (ref 15–41)
Albumin: 1.2 g/dL — ABNORMAL LOW (ref 3.5–5.0)
Alkaline Phosphatase: 107 U/L (ref 38–126)
Anion gap: 5 (ref 5–15)
BUN: 25 mg/dL — ABNORMAL HIGH (ref 6–20)
CO2: 28 mmol/L (ref 22–32)
Calcium: 6.8 mg/dL — ABNORMAL LOW (ref 8.9–10.3)
Chloride: 108 mmol/L (ref 98–111)
Creatinine, Ser: 0.52 mg/dL — ABNORMAL LOW (ref 0.61–1.24)
GFR, Estimated: >60 mL/min (ref >60)
Glucose, Bld: 320 mg/dL — ABNORMAL HIGH (ref 70–99)
Potassium: 3.8 mmol/L (ref 3.5–5.1)
Sodium: 141 mmol/L (ref 135–145)
Total Bilirubin: 0.7 mg/dL (ref 0.3–1.2)
Total Protein: 3.6 g/dL — ABNORMAL LOW (ref 6.5–8.1)

## 2020-10-26 LAB — PREPARE RBC (CROSSMATCH)

## 2020-10-26 LAB — HEMOGLOBIN AND HEMATOCRIT, BLOOD
HCT: 24 % — ABNORMAL LOW (ref 39.0–52.0)
HCT: 18.5 % — ABNORMAL LOW (ref 39.0–52.0)
Hemoglobin: 7.3 g/dL — ABNORMAL LOW (ref 13.0–17.0)
Hemoglobin: 6.2 g/dL — CL (ref 13.0–17.0)

## 2020-10-26 LAB — PROTIME-INR
INR: 1.4 — ABNORMAL HIGH (ref 0.8–1.2)
Prothrombin Time: 16.7 seconds — ABNORMAL HIGH (ref 11.4–15.2)

## 2020-10-26 LAB — APTT: aPTT: 29 seconds (ref 24–36)

## 2020-10-26 MED ORDER — IOHEXOL 350 MG/ML SOLN
100.0000 mL | Freq: Once | INTRAVENOUS | Status: AC | PRN
  Administered 2020-10-26: 100 mL via INTRAVENOUS

## 2020-10-26 MED ORDER — SODIUM CHLORIDE 0.9% IV SOLUTION: Freq: Once | INTRAVENOUS | Status: AC

## 2020-10-26 MED ORDER — ALPRAZOLAM 0.25 MG PO TABS
0.2500 mg | ORAL_TABLET | Freq: Once | ORAL | Status: AC
  Administered 2020-10-26: 0.25 mg via ORAL

## 2020-10-26 MED ORDER — ALPRAZOLAM 0.25 MG PO TABS
0.2500 mg | ORAL_TABLET | Freq: Three times a day (TID) | ORAL | Status: DC | PRN
  Administered 2020-10-26 – 2020-10-30 (×2): 0.25 mg via ORAL
  Filled 2020-10-26 (×3): qty 1

## 2020-10-26 MED ORDER — SODIUM CHLORIDE 0.9 % IV SOLN
8.0000 mg/h | INTRAVENOUS | Status: DC
  Administered 2020-10-26 (×2): 8 mg/h via INTRAVENOUS
  Filled 2020-10-26 (×9): qty 80

## 2020-10-26 MED ORDER — SODIUM CHLORIDE 0.9 % IV SOLN
80.0000 mg | Freq: Once | INTRAVENOUS | Status: AC
  Administered 2020-10-26: 80 mg via INTRAVENOUS
  Filled 2020-10-26: qty 80

## 2020-10-26 MED ORDER — PANTOPRAZOLE SODIUM 40 MG IV SOLR
40.0000 mg | Freq: Two times a day (BID) | INTRAVENOUS | Status: DC
  Administered 2020-10-26 – 2020-11-01 (×12): 40 mg via INTRAVENOUS
  Filled 2020-10-26 (×11): qty 40

## 2020-10-26 MED ORDER — INSULIN GLARGINE 100 UNIT/ML ~~LOC~~ SOLN
10.0000 [IU] | Freq: Every day | SUBCUTANEOUS | Status: DC
  Administered 2020-10-26 – 2020-11-01 (×7): 10 [IU] via SUBCUTANEOUS
  Filled 2020-10-26 (×7): qty 0.1

## 2020-10-26 MED ORDER — PANTOPRAZOLE SODIUM 40 MG IV SOLR: 40.0000 mg | Freq: Two times a day (BID) | INTRAVENOUS | Status: DC

## 2020-10-26 MED ORDER — SODIUM CHLORIDE 0.9 % IV BOLUS
500.0000 mL | Freq: Once | INTRAVENOUS | Status: AC
  Administered 2020-10-26: 500 mL via INTRAVENOUS

## 2020-10-26 MED ORDER — INSULIN GLARGINE 100 UNIT/ML ~~LOC~~ SOLN
10.0000 [IU] | Freq: Once | SUBCUTANEOUS | Status: AC
  Administered 2020-10-26: 10 [IU] via SUBCUTANEOUS
  Filled 2020-10-26: qty 0.1

## 2020-10-26 NOTE — Progress Notes (Signed)
eLink Physician-Brief Progress Note Patient Name: Ronald Brady DOB: July 25, 1960 MRN: [id number]   Date of Service  10/26/2020  HPI/Events of Note  Anxiety - Already on Xanax 0.25 mg PO TID.   eICU Interventions  Plan: 1. Xanax 0.25 mg PO X 1 now (extra dose).     Intervention Category Major Interventions: Delirium, psychosis, severe agitation - evaluation and management  Ronald Brady 10/26/2020, 11:31 PM

## 2020-10-26 NOTE — Progress Notes (Signed)
Surgery delayed due to GI bleed , was on Heparin for DVT prophylaxis. Last Hgb  6.2 . Labs at 5 PM and 8 PM but No results.   Got blood 2 units, no new results back. Not stable for surgery , needs Hgb more than 7.0 for anesthesia,  Placed on surgery schedule for 6 PM Thursday and will proceed if stable. Has pocket of pus  mid to distal thigh that needs to be drained. Surgery tomorrow if Hgb more than 7.0 . IF not will need additional transfusion.

## 2020-10-26 NOTE — Consult Note (Signed)
Referring Provider: Dr. Virl Diamond Primary Care Physician:  Zhou-Talbert, Ralene Bathe, MD Primary Gastroenterologist:  Gentry Fitz  Reason for Consultation:  Rectal bleeding, anemia  HPI: Ronald Brady is a 60 y.o. male currently hospitalized with septic shock secondary of MSSA abscess of right thigh and left wrist with history of DM type 2 presenting for consultation of rectal bleeding.  Patient had an episode of lower GI bleeding overnight with drop in Hgb from 7.9 to 5.9.  Hgb improved to 7.3 this morning after 2u pRBCs and 2u FFP.  Patient has been having diarrhea this admission but otherwise denies GI complaints. Denies prior episodes of GI bleeding.  Denies abdominal pain, nausea, vomiting, GERD.  He was on Heparin SQ 5000u q8h, which was stopped yesterday (last dose 4/5 at 14:58). No current ASA/NSAID use.    Family history pertinent for father with colon cancer, deceased at age 15.  Patient has never had an EGD or colonoscopy.  Past Medical History:  Diagnosis Date  . Diabetes mellitus without complication (HCC)   . Emphysema lung (HCC)   . Hypertension     Past Surgical History:  Procedure Laterality Date  . ABDOMINAL SURGERY    . INCISION AND DRAINAGE Left 11/02/2020   Procedure: INCISION AND DRAINAGE  AND DEBRIDEMENT LEFT WRIST AND RIGHT POSTERIOR THIGH APPLICATION OF WOUND VACS TO LEFT WRIST WOUND AND RIGHT POSTERIOR THIGH WOUND;  Surgeon: Eldred Manges, MD;  Location: [facility];  Service: Orthopedics;  Laterality: Left;    Prior to Admission medications   Medication Sig Start Date End Date Taking? Authorizing Provider  albuterol (PROAIR HFA) 108 (90 Base) MCG/ACT inhaler Inhale 1 puff into the lungs every 6 (six) hours as needed for wheezing or shortness of breath.   Yes [provider]  budesonide-formoterol (SYMBICORT) 160-4.5 MCG/ACT inhaler Inhale 2 puffs into the lungs 2 (two) times daily as needed (shortness of breath/wheezing).   Yes [provider]  insulin isophane & regular human (HUMULIN 70/30 KWIKPEN) (70-30) 100 UNIT/ML KwikPen Inject 67 Units into the skin 3 (three) times daily.   Yes [provider]  metFORMIN (GLUCOPHAGE) 1000 MG tablet Take 1,000 mg by mouth 2 (two) times daily. 09/21/20  Yes [provider]  pravastatin (PRAVACHOL) 20 MG tablet Take 20 mg by mouth at bedtime. 05/26/19  Yes [provider]  HYDROcodone-acetaminophen (NORCO) 5-325 MG tablet Take 1 tablet by mouth every 6 (six) hours as needed. Patient not taking: No sig reported 09/28/20   Adonis Huguenin, NP  ibuprofen (ADVIL) 600 MG tablet Take 1 tablet (600 mg total) by mouth every 8 (eight) hours as needed for moderate pain. Patient not taking: No sig reported 09/05/20   Terrilee Files, MD  meloxicam (MOBIC) 15 MG tablet Take 1 tablet (15 mg total) by mouth daily. Patient not taking: No sig reported 09/28/20   Adonis Huguenin, NP  nicotine (NICODERM CQ - DOSED IN MG/24 HOURS) 14 mg/24hr patch Place 1 patch (14 mg total) onto the skin daily. Patient not taking: No sig reported 08/23/19   Pokhrel, Rebekah Chesterfield, MD    Scheduled Meds: . chlorhexidine  60 mL Topical Once  . chlorhexidine  15 mL Mouth Rinse BID  . Chlorhexidine Gluconate Cloth  6 each Topical Daily  . docusate sodium  100 mg Oral BID  . insulin aspart  0-5 Units Subcutaneous QHS  . insulin aspart  0-9 Units Subcutaneous TID WC  . insulin aspart  4 Units Subcutaneous TID  WC  . insulin glargine  10 Units Subcutaneous QHS  . insulin glargine  10 Units Subcutaneous Once  . ipratropium-albuterol  3 mL Nebulization QID  . mouth rinse  15 mL Mouth Rinse q12n4p  . metFORMIN  1,000 mg Oral BID WC  . mometasone-formoterol  2 puff Inhalation BID  . nicotine  21 mg Transdermal Daily  . [START ON 10/29/2020] pantoprazole  40 mg Intravenous Q12H  . povidone-iodine  2 application Topical Once  . sodium chloride flush  10-40 mL Intracatheter Q12H   Continuous Infusions: .  ceFAZolin  (ANCEF) IV Stopped (10/26/20 0846)  .  ceFAZolin (ANCEF) IV    . lactated ringers Stopped (10/24/20 1425)  . norepinephrine (LEVOPHED) Adult infusion 7 mcg/min (10/26/20 0900)  . pantoprozole (PROTONIX) infusion 8 mg/hr (10/26/20 0900)   PRN Meds:.albuterol, dextrose, fentaNYL (SUBLIMAZE) injection, melatonin, metoCLOPramide **OR** metoCLOPramide (REGLAN) injection, morphine injection, ondansetron **OR** ondansetron (ZOFRAN) IV, oxyCODONE-acetaminophen, phenol, sodium chloride flush  Allergies as of 10/25/2020 - Review Complete 11/04/2020  Allergen Reaction Noted  . Penicillins Other (See Comments) 08/18/2019    No family history on file.  Social History   Socioeconomic History  . Marital status: Single    Spouse name: Not on file  . Number of children: Not on file  . Years of education: Not on file  . Highest education level: Not on file  Occupational History  . Not on file  Tobacco Use  . Smoking status: Current Every Day Smoker    Packs/day: 1.50  . Smokeless tobacco: Never Used  Substance and Sexual Activity  . Alcohol use: Never  . Drug use: Never  . Sexual activity: Not on file  Other Topics Concern  . Not on file  Social History Narrative  . Not on file   Social Determinants of Health   Financial Resource Strain: Not on file  Food Insecurity: Not on file  Transportation Needs: Not on file  Physical Activity: Not on file  Stress: Not on file  Social Connections: Not on file  Intimate Partner Violence: Not on file    Review of Systems: Review of Systems  Constitutional: Negative for chills and fever.  HENT: Negative for hearing loss and tinnitus.   Eyes: Negative for pain and redness.  Respiratory: Negative for cough and shortness of breath.   Cardiovascular: Negative for chest pain and palpitations.  Gastrointestinal: Positive for blood in stool and diarrhea. Negative for abdominal pain, constipation, heartburn, melena, nausea and vomiting.   Genitourinary: Negative for flank pain and hematuria.  Musculoskeletal: Negative for falls and joint pain.  Skin: Negative for itching and rash.  Neurological: Negative for seizures and loss of consciousness.  Endo/Heme/Allergies: Negative for polydipsia. Does not bruise/bleed easily.  Psychiatric/Behavioral: Negative for substance abuse. The patient is not nervous/anxious.     Physical Exam: Vital signs: Vitals:   10/26/20 0900 10/26/20 1138  BP: (!) 106/52   Pulse: (!) 113   Resp: 17   Temp: 98.1 F (36.7 C)   SpO2: 98% 96%   Last BM Date: 10/26/20  Physical Exam Vitals reviewed.  Constitutional:      General: He is not in acute distress.    Comments: Chronically ill-appearing  HENT:     Head: Normocephalic and atraumatic.     Nose: Nose normal. No congestion.     Mouth/Throat:     Mouth: Mucous membranes are moist.     Pharynx: Oropharynx is clear.  Eyes:     General: No scleral icterus.  Extraocular Movements: Extraocular movements intact.     Comments: Conjunctival pallor  Cardiovascular:     Rate and Rhythm: Regular rhythm. Tachycardia present.  Pulmonary:     Effort: Pulmonary effort is normal.  Abdominal:     General: Bowel sounds are normal. There is no distension.     Palpations: Abdomen is soft. There is no mass.     Tenderness: There is no abdominal tenderness. There is no guarding or rebound.     Hernia: No hernia is present.  Musculoskeletal:     Cervical back: Neck supple. No rigidity.     Comments: Bandage on right thigh and left arm  Skin:    General: Skin is warm and dry.  Neurological:     General: No focal deficit present.     Mental Status: He is alert and oriented to person, place, and time.  Psychiatric:        Mood and Affect: Mood normal.        Behavior: Behavior normal.     GI:  Lab Results: Recent Labs    10/25/20 0356 10/25/20 1953 10/26/20 0353  WBC 8.7 8.4 12.8*  HGB 7.9* 5.9* 7.3*  HCT 25.0* 19.4* 21.9*  PLT 180  166 145*   BMET Recent Labs    10/24/20 1830 10/25/20 0356 10/26/20 0353  NA 140 138 141  K 3.3* 3.7 3.8  CL 107 104 108  CO2 28 29 28   GLUCOSE 263* 222* 320*  BUN 16 16 25*  CREATININE 0.49* 0.52* 0.52*  CALCIUM 7.6* 7.5* 6.8*   LFT Recent Labs    10/26/20 0353  PROT 3.6*  ALBUMIN 1.2*  AST 11*  ALT 7  ALKPHOS 107  BILITOT 0.7   PT/INR Recent Labs    10/26/20 0227  LABPROT 16.7*  INR 1.4*     Studies/Results: CT FEMUR RIGHT WO CONTRAST  Result Date: 10/25/2020 CLINICAL DATA:  Hamstrings abscess assess collection EXAM: CT OF THE LOWER RIGHT EXTREMITY WITHOUT CONTRAST TECHNIQUE: Multidetector CT imaging of the right lower extremity was performed according to the standard protocol. COMPARISON:  October 22, 2020 FINDINGS: Bones/Joint/Cartilage No areas of periosteal reaction or cortical destruction is seen. No fracture or dislocation. The articular surfaces appear to be maintained. Ligaments Suboptimally assessed by CT. Muscles and Tendons Within the hamstrings musculature the of the level of the mid to distal fever there remains a multilocular collection containing foci of gas measuring approximately 5.4 x 3.7 x 6.0 cm. Overlying subcutaneous emphysema and postsurgical changes are noted along the posterior upper thigh. Soft tissues As described above there is postsurgical changes seen within the posterior upper thigh. IMPRESSION: There remains a multilocular fluid collection within the lower hamstrings muscles at the level of the mid to distal femur with foci of subcutaneous emphysema measuring 5.4 x 3.7 x 3 x 6.0 cm. Electronically Signed   By: October 24, 2020 M.D.   On: 10/25/2020 19:22   ECHOCARDIOGRAM COMPLETE  Result Date: 10/24/2020    ECHOCARDIOGRAM REPORT   Patient Name:   KASHTYN JANKOWSKI [facility] Date of Exam: 10/24/2020 Medical Rec #:  12/24/2020         Height:       65.0 in Accession #:    [id number]        Weight:       166.9 lb Date of Birth:  1960/12/20         BSA:           1.832 m Patient Age:  60 years          BP:           93/66 mmHg Patient Gender: M                 HR:           100 bpm. Exam Location:  Inpatient Procedure: 2D Echo, Color Doppler and Cardiac Doppler Indications:    Bacteremia R78.81  History:        Patient has no prior history of Echocardiogram examinations.                 Emphysema. Severe sepsis. Soft tissue infection-abscess. MSSA                 bacteremia.  Sonographer:    Leta Jungling RDCS Referring Phys: [phone] MARK C YATES IMPRESSIONS  1. Left ventricular ejection fraction, by estimation, is 60 to 65%. The left ventricle has normal function. The left ventricle has no regional wall motion abnormalities. Left ventricular diastolic parameters were normal.  2. Right ventricular systolic function is normal. The right ventricular size is normal. There is normal pulmonary artery systolic pressure.  3. The mitral valve is abnormal. No evidence of mitral valve regurgitation. No evidence of mitral stenosis.  4. The aortic valve is tricuspid. There is mild calcification of the aortic valve. Aortic valve regurgitation is not visualized. Mild to moderate aortic valve sclerosis/calcification is present, without any evidence of aortic stenosis.  5. The inferior vena cava is normal in size with greater than 50% respiratory variability, suggesting right atrial pressure of 3 mmHg. FINDINGS  Left Ventricle: Left ventricular ejection fraction, by estimation, is 60 to 65%. The left ventricle has normal function. The left ventricle has no regional wall motion abnormalities. The left ventricular internal cavity size was normal in size. There is  no left ventricular hypertrophy. Left ventricular diastolic parameters were normal. Right Ventricle: The right ventricular size is normal. No increase in right ventricular wall thickness. Right ventricular systolic function is normal. There is normal pulmonary artery systolic pressure. The tricuspid regurgitant velocity is 2.33  m/s, and  with an assumed right atrial pressure of 3 mmHg, the estimated right ventricular systolic pressure is 24.7 mmHg. Left Atrium: Left atrial size was normal in size. Right Atrium: Right atrial size was normal in size. Pericardium: There is no evidence of pericardial effusion. Mitral Valve: The mitral valve is abnormal. There is mild thickening of the mitral valve leaflet(s). There is mild calcification of the mitral valve leaflet(s). No evidence of mitral valve regurgitation. No evidence of mitral valve stenosis. Tricuspid Valve: The tricuspid valve is normal in structure. Tricuspid valve regurgitation is trivial. No evidence of tricuspid stenosis. Aortic Valve: The aortic valve is tricuspid. There is mild calcification of the aortic valve. Aortic valve regurgitation is not visualized. Mild to moderate aortic valve sclerosis/calcification is present, without any evidence of aortic stenosis. Pulmonic Valve: The pulmonic valve was not well visualized. Pulmonic valve regurgitation is not visualized. No evidence of pulmonic stenosis. Aorta: The aortic root is normal in size and structure. Venous: The inferior vena cava is normal in size with greater than 50% respiratory variability, suggesting right atrial pressure of 3 mmHg. IAS/Shunts: No atrial level shunt detected by color flow Doppler.  LEFT VENTRICLE PLAX 2D LVIDd:         4.50 cm  Diastology LVIDs:         3.20 cm  LV e' medial:    9.90  cm/s LV PW:         0.90 cm  LV E/e' medial:  8.8 LV IVS:        0.80 cm  LV e' lateral:   12.60 cm/s LVOT diam:     2.00 cm  LV E/e' lateral: 6.9 LV SV:         71 LV SV Index:   39 LVOT Area:     3.14 cm  RIGHT VENTRICLE RV S prime:     14.60 cm/s TAPSE (M-mode): 2.2 cm LEFT ATRIUM             Index       RIGHT ATRIUM           Index LA diam:        3.50 cm 1.91 cm/m  RA Area:     11.30 cm LA Vol (A2C):   19.6 ml 10.70 ml/m RA Volume:   23.50 ml  12.83 ml/m LA Vol (A4C):   17.0 ml 9.28 ml/m LA Biplane Vol: 18.2 ml  9.94 ml/m  AORTIC VALVE LVOT Vmax:   131.00 cm/s LVOT Vmean:  87.700 cm/s LVOT VTI:    0.225 m  AORTA Ao Root diam: 3.30 cm Ao Asc diam:  2.50 cm MITRAL VALVE               TRICUSPID VALVE MV Area (PHT): 3.27 cm    TR Peak grad:   21.7 mmHg MV Decel Time: 232 msec    TR Vmax:        233.00 cm/s MV E velocity: 87.00 cm/s MV A velocity: 71.10 cm/s  SHUNTS MV E/A ratio:  1.22        Systemic VTI:  0.22 m                            Systemic Diam: 2.00 cm Charlton Haws MD Electronically signed by Charlton Haws MD Signature Date/Time: 10/24/2020/3:25:23 PM    Final     Impression: Painless hematochezia, most consistent with diverticular bleeding -Drop in Hgb overnight from 7.9 to 5.9.  Hgb improved to 7.3 this morning after 2u pRBCs and 2u FFP. -Hemodynamically stable, do not suspect ongoing active bleeding at this time -Mild increase in BUN today, but presentation not consistent with upper GI bleeding.  BUN 25/ Cr 0.52  Septic shock secondary of MSSA abscess of right thigh and left wrist   DM type 2   Plan: CT-A Abdomen/pelvis to rule out any mass lesion as source of bleeding, assess vasculature in the abdomen, and rule out ongoing bleeding.  OK to continue Protonix 40 mg IV BID for now.  Continue to monitor H&H with transfusion as needed to maintain Hgb >7.  If destabilizing bleeding, consider repeat CT-A vs. Nuclear bleeding scan if ongoing but not destabilizing bleeding.  [facility] will follow.   LOS: 4 days   Edrick Kins  PA-C 10/26/2020, 12:12 PM  Contact #  [phone]

## 2020-10-26 NOTE — Progress Notes (Signed)
NAME:  Ronald Brady, MRN:  [id number], DOB:  Apr 26, 1961, LOS: 4 ADMISSION DATE:  11/10/2020, CONSULTATION DATE:  4/3 REFERRING MD:  [facility], CHIEF COMPLAINT:  sepsis   History of Present Illness:  60 y/o M, smoker, with poorly controlled DM who presented to [facility] 4/2 after being found at home in a chair covered in urine & feces.  The patient was seen 09/28/20 by Ortho for a hamstring tear.  He was found in his home having sat in a chair for four days incontinent of urine / feces.  He was found to have hypotension which responded to IVF, noted to have an abscess on his upper posterior right leg and left wrist with exposed tendon. I&D was completed in the ER by EDP. CT of the right femur showed elongated gas and fluid collection along the surface of the hamstring muscles extending deep into the hamstring muscle compatible with intramuscular abscess. Clindamycin was initiated. He required insulin gtt for DKA. He was transferred 4/3 to [facility]. The patient required I&D per Ortho with VAC placement on right thigh and left wrist.  Pertinent  Medical History  Tobacco Abuse - began smoking at 80 Worked as a logger  Poorly controlled DM - Hgb A1c 11.8 in 07/2019  HTN   Significant Hospital Events: Including procedures, antibiotic start and stop dates in addition to other pertinent events    4/02 presented to ER in shock due to multiple abscess (right thigh, left wrist), DKA. CT femur found elongated gas and fluid collection in the right hamstring c/w abscess. No evidence of osteomyelitis. Treated with clinda, meropenem, cultured, central line placed. I&D in ER.   4/03 Wrist CT with gas throughout the SQ tissues in the anterior distal forearm. Ortho consulted > taken to OR for debridement of right posterior hamstring and left forearm. Intraoperative cultures obtained. Cultures growing staph aureus (sensitivities pending). Vanco added but abx narrowed to cefazolin late pm  4/04 Ortho note  reflects concern for poor wound healing, may need hip disarticulation. On levophed 1-2 mcg, BCID with staph aureus, UC 100k staph aureus.  ECHO 60-65% EF, no evidence of vegetation mentioned  4/5 CT Femur w/o contrast with multilocular fluid collection in the lower hamstrings muscles at the level of the mid to distal femur with foci of SQ emphysema  4/6 GIB overnight, Hgb down to 5.9, tx 2 units PRBC's, 1 unit FFP, PPI gtt initiated + IVF.  Follow up Hgb 7.3, remains on low dose pressors. GI consulted.   Interim History / Subjective:  RN reports BRBPR overnight, Hgb down to 5.9 requiring 2 units PRVC, 1 unit FFP Afebrile / WBC 12.8  Glucose range 282-321  I/O UOP, +2.3L in last 24 hours  Objective   Blood pressure 105/63, pulse (!) 103, temperature 98.2 F (36.8 C), temperature source Oral, resp. rate (!) 22, height 5\' 5"  (1.651 m), weight 75.7 kg, SpO2 97 %. CVP:  [7 mmHg-12 mmHg] 12 mmHg      Intake/Output Summary (Last 24 hours) at 10/26/2020 0827 Last data filed at 10/26/2020 0649 Gross per 24 hour  Intake 2743.81 ml  Output 352 ml  Net 2391.81 ml   Filed Weights   11/08/2020 1551 11/20/2020 1510 10/24/20 0515  Weight: 68 kg 70 kg 75.7 kg    Examination: General: chronically ill appearing adult male lying in bed in NAD  HEENT: MM pink/moist, [place] O2, anicteric  Neuro: AAOx4, speech clear, MAE  CV: s1s2 RRR, no  m/r/g PULM: non-labored on [place] O2, lungs bilaterally clear GI: soft, bsx4 active  Extremities: warm/dry, trace dependent edema, right upper thigh with ACE wrap and VAC in place, L wrist wrapped with ACE wrap + VAC Skin: thin skin, multiple small abrasions on forearms / LE's           Resolved Hospital Problem list     Assessment & Plan:   Septic Shock secondary MSSA Abscess of R Thigh, L Wrist with MSSA Bacteremia  TTE w/o evidence of endocarditis. Clinically improved in terms of hemodynamics 4/5 but had GIB overnight into 4/6, back on pressors.  -LR at 50  ml/hr -wean levophed for MAP >65 -cefazolin for MSSA bacteremia  -appreciate Ortho, ID input  -CT femur as above  -follow 4/4 cultures, if they do not clear may need TEE -PRN pain control with bowel regimen   Poorly Controlled DM with Hyperglycemia  Hyperglycemia on admit with glucose >900, small ketones in urine.  Hgb A1c 12.9 -ACHS SSI  -lantus 20 units QHS > received 10 units PM 4/5, glucose in 300's, add additional 10 units lantus now  -continue metformin  -meal coverage with 4 units TID   Hypokalemia  -follow electrolytes, replace as indicated   GIB  BRBPR, suspect diverticular bleed  -GI consulted 4/6 for evaluation  -monitor for further bleeding   Anemia  -follow CBC -transfuse for Hgb <7%  Active Smoker, Suspected COPD / Asthmatic Bronchitis -continue Dulera, Duoneb  -wean O2 for sats >90% -pulmonary hygiene - IS, mobilize  -smoking cessation counseling   Best practice (right click and "Reselect all SmartList Selections" daily)  Diet:  Oral / NPO for surgery Pain/Anxiety/Delirium protocol (if indicated): No VAP protocol (if indicated): Not indicated DVT prophylaxis: SCD GI prophylaxis: PPI Glucose control:  SSI Yes Central venous access:  Yes, and it is still needed Arterial line:  N/A Foley:  Yes, and it is still needed Mobility:  bed rest  PT consulted: N/A Last date of multidisciplinary goals of care discussion n/a Code Status:  full code Disposition: ICU     Critical Care Time: 34 minutes   Canary Brim, MSN, APRN, NP-C, AGACNP-BC [facility] 10/26/2020, 8:27 AM   Please see [email or web address] for pager details.   From 7A-7P if no response, please call [phone] After hours, please call ELink [phone]

## 2020-10-26 NOTE — Progress Notes (Signed)
[facility]  Date of Admission:  11/04/2020     CC: mssa bacteremia  Lines: peripheral  Abx: 4/04-c cefazolin  4/02-03 clinda, meropenem, vanc  ASSESSMENT: 60 yo male with dm admitted after found down/stuck between furniture, for severe sepsis (hypothermia) in setting abscess in RLE (posterior thigh) along with left volar wrist and distal forearm exposed tendon/soft tissue infection-abscess, and mssa bacteremia   #mssa bacteremia #severe sepsis with septic shock #soft tissue RLE and LUE abscesses 4/02 initial bcx mssa, source from abscess; s/p I&D 4/03; wound cx mssa 4/04 repeat bcx ngtd  Ct imaging LUE and RLE no osseous involvement 4/04 tte no obvious vegetation or significant regurgitation; pending tee  Clinically still critically ill, complicated by gib disturbing hemodynamics. Pending tee and more I&D of right thigh when stable from gib stand point    -tee when feasible -continue cefazolin -appreciate surgery team management of abscess   I spent more than 35 minute reviewing data/chart, and coordinating care and >50% direct face to face time providing counseling/discussing diagnostics/treatment plan with patient    Principal Problem:   Severe sepsis with septic shock (HCC) Active Problems:   Abscess of right thigh   Abscess of left forearm   Diabetes mellitus (HCC)   Hyperosmolar hyperglycemic state (HHS) (HCC)   AKI (acute kidney injury) (HCC)   Acute lower UTI   Emphysema lung (HCC)   Abscess of right lower extremity   MSSA bacteremia   Pressure injury of skin   Allergies  Allergen Reactions  . Codeine Nausea And Vomiting  . Penicillins Other (See Comments)    Pt is unsure of reaction. States PCP told him he was allergic. Did it involve swelling of the face/tongue/throat, SOB, or low BP? Unknown Did it involve sudden or severe rash/hives, skin peeling, or any reaction on the inside of your mouth or nose?  Unknown Did you need to seek medical attention at a hospital or doctor's office? Unknown When did it last happen?unk If all above answers are "NO", may proceed with cephalosporin use.     Scheduled Meds: . chlorhexidine  60 mL Topical Once  . chlorhexidine  15 mL Mouth Rinse BID  . Chlorhexidine Gluconate Cloth  6 each Topical Daily  . docusate sodium  100 mg Oral BID  . insulin aspart  0-5 Units Subcutaneous QHS  . insulin aspart  0-9 Units Subcutaneous TID WC  . insulin aspart  4 Units Subcutaneous TID WC  . insulin glargine  10 Units Subcutaneous QHS  . ipratropium-albuterol  3 mL Nebulization QID  . mouth rinse  15 mL Mouth Rinse q12n4p  . metFORMIN  1,000 mg Oral BID WC  . mometasone-formoterol  2 puff Inhalation BID  . nicotine  21 mg Transdermal Daily  . pantoprazole  40 mg Intravenous Q12H  . povidone-iodine  2 application Topical Once  . sodium chloride flush  10-40 mL Intracatheter Q12H   Continuous Infusions: .  ceFAZolin (ANCEF) IV 2 g (10/26/20 1457)  .  ceFAZolin (ANCEF) IV    . lactated ringers Stopped (10/24/20 1425)  . norepinephrine (LEVOPHED) Adult infusion 7 mcg/min (10/26/20 0900)   PRN Meds:.albuterol, dextrose, fentaNYL (SUBLIMAZE) injection, melatonin, metoCLOPramide **OR** metoCLOPramide (REGLAN) injection, morphine injection, ondansetron **OR** ondansetron (ZOFRAN) IV, oxyCODONE-acetaminophen, phenol, sodium chloride flush   SUBJECTIVE: gib starting last pm with acute blood loss; gi evaluating Orthopedics has planned more I&D of right thigh Patient remains on low dose levophed  in setting gib Repeat bcx negative  Tee not yet done due to critical illness No further black/red stool today No other complaints; wants to eat/drink as he is npo    Review of Systems: ROS All other ROS was negative, except mentioned above     OBJECTIVE: Vitals:   10/26/20 1324 10/26/20 1346 10/26/20 1555 10/26/20 1604  BP: (!) 93/54 (!) 84/72 (!) 84/72    Pulse: (!) 110 (!) 117 (!) 113 (!) 106  Resp: (!) 23 (!) 21 20 20   Temp: 98.4 F (36.9 C) 98.2 F (36.8 C) 98.3 F (36.8 C) 98.3 F (36.8 C)  TempSrc: Oral Oral Oral Oral  SpO2: 100% 100% 97% 99%  Weight:      Height:       Body mass index is 27.77 kg/m.  Physical Exam  General/constitutional: no distress, disheveled, conversant HEENT: Normocephalic, PER, Conj Clear, EOMI, Oropharynx clear Neck supple CV: rrr no mrg Lungs: clear to auscultation, normal respiratory effort Abd: Soft, Nontender Ext: no edema Skin: right posterior thigh and left forearm wound with wound vac on. Right posterior thigh/surrounding soft tissue tender to palpation Neuro: nonfocal MSK: no peripheral joint swelling/tenderness/warmth; back spines nontender   Central line presence: yes, right ij triple lumen; site no erythema/fluctuance/tenderness   Lab Results Lab Results  Component Value Date   WBC 12.8 (H) 10/26/2020   HGB 6.2 (LL) 10/26/2020   HCT 18.5 (L) 10/26/2020   MCV 86.2 10/26/2020   PLT 145 (L) 10/26/2020    Lab Results  Component Value Date   CREATININE 0.52 (L) 10/26/2020   BUN 25 (H) 10/26/2020   NA 141 10/26/2020   K 3.8 10/26/2020   CL 108 10/26/2020   CO2 28 10/26/2020    Lab Results  Component Value Date   ALT 7 10/26/2020   AST 11 (L) 10/26/2020   ALKPHOS 107 10/26/2020   BILITOT 0.7 10/26/2020      Microbiology: Recent Results (from the past 240 hour(s))  Blood culture (routine x 2)     Status: Abnormal   Collection Time: 10/30/2020  4:12 PM   Specimen: Right Antecubital; Blood  Result Value Ref Range Status   Specimen Description   Final    RIGHT ANTECUBITAL BOTTLES DRAWN AEROBIC AND ANAEROBIC Performed at [facility], [address], [place], [place]    Special Requests   Final    Blood Culture results may not be optimal due to an inadequate volume of blood received in culture bottles Performed at [facility], [address] [place],  [place]    Culture  Setup Time   Final    GRAM POSITIVE COCCI IN BOTH AEROBIC AND ANAEROBIC BOTTLES Gram Stain Report Called to,Read Back By and Verified With: CRAWFORD,H@1015  BY MATTHEWS, B 4.3.22 CRITICAL VALUE NOTED.  VALUE IS CONSISTENT WITH PREVIOUSLY REPORTED AND CALLED VALUE. Performed at [facility], [address], [place], [place]    Culture (A)  Final    STAPHYLOCOCCUS AUREUS SUSCEPTIBILITIES PERFORMED ON PREVIOUS CULTURE WITHIN THE LAST 5 DAYS. Performed at [facility], [address] [place], [place]    Report Status 10/25/2020 FINAL  Final  Blood culture (routine x 2)     Status: Abnormal   Collection Time: 10/29/2020  4:13 PM   Specimen: Left Antecubital; Blood  Result Value Ref Range Status   Specimen Description   Final    LEFT ANTECUBITAL BOTTLES DRAWN AEROBIC AND ANAEROBIC Performed at [facility], [address] [place], [place] [address]    Special Requests   Final    Blood Culture adequate volume Performed at [facility], [address], [place], [place] [address]    Culture  Setup Time   Final    GRAM POSITIVE COCCI AEROBIC AND ANAEROBIC BOTTLE Gram Stain Report Called to,Read Back By and Verified With: CRAWFORD,[email or web address]  BY MATTHEWS,B 4.3.22 Organism ID to follow CRITICAL RESULT CALLED TO, READ BACK BY AND VERIFIED WITH: S CHRISTY PHARMD  10/29/2020 EB Performed at [facility], [address], [place], [place] [address]    Culture STAPHYLOCOCCUS AUREUS (A)  Final   Report Status 10/25/2020 FINAL  Final   Organism ID, Bacteria STAPHYLOCOCCUS AUREUS  Final      Susceptibility   Staphylococcus aureus - MIC*    CIPROFLOXACIN <=0.5 SENSITIVE Sensitive     ERYTHROMYCIN <=0.25 SENSITIVE Sensitive     GENTAMICIN <=0.5 SENSITIVE Sensitive     OXACILLIN <=0.25 SENSITIVE Sensitive     TETRACYCLINE <=1 SENSITIVE Sensitive     VANCOMYCIN 1 SENSITIVE Sensitive     TRIMETH/SULFA <=10 SENSITIVE Sensitive     CLINDAMYCIN <=0.25  SENSITIVE Sensitive     RIFAMPIN <=0.5 SENSITIVE Sensitive     Inducible Clindamycin NEGATIVE Sensitive     * STAPHYLOCOCCUS AUREUS  Blood Culture ID Panel (Reflexed)     Status: Abnormal   Collection Time: 11-20-20  4:13 PM  Result Value Ref Range Status   Enterococcus faecalis NOT DETECTED NOT DETECTED Final   Enterococcus Faecium NOT DETECTED NOT DETECTED Final   Listeria monocytogenes NOT DETECTED NOT DETECTED Final   Staphylococcus species DETECTED (A) NOT DETECTED Final    Comment: CRITICAL RESULT CALLED TO, READ BACK BY AND VERIFIED WITH: S CHRISTY PHARMD  10/31/2020 EB    Staphylococcus aureus (BCID) DETECTED (A) NOT DETECTED Final    Comment: CRITICAL RESULT CALLED TO, READ BACK BY AND VERIFIED WITH: S CHRISTY PHARMD  10/25/2020 EB    Staphylococcus epidermidis NOT DETECTED NOT DETECTED Final   Staphylococcus lugdunensis NOT DETECTED NOT DETECTED Final   Streptococcus species NOT DETECTED NOT DETECTED Final   Streptococcus agalactiae NOT DETECTED NOT DETECTED Final   Streptococcus pneumoniae NOT DETECTED NOT DETECTED Final   Streptococcus pyogenes NOT DETECTED NOT DETECTED Final   A.calcoaceticus-baumannii NOT DETECTED NOT DETECTED Final   Bacteroides fragilis NOT DETECTED NOT DETECTED Final   Enterobacterales NOT DETECTED NOT DETECTED Final   Enterobacter cloacae complex NOT DETECTED NOT DETECTED Final   Escherichia coli NOT DETECTED NOT DETECTED Final   Klebsiella aerogenes NOT DETECTED NOT DETECTED Final   Klebsiella oxytoca NOT DETECTED NOT DETECTED Final   Klebsiella pneumoniae NOT DETECTED NOT DETECTED Final   Proteus species NOT DETECTED NOT DETECTED Final   Salmonella species NOT DETECTED NOT DETECTED Final   Serratia marcescens NOT DETECTED NOT DETECTED Final   Haemophilus influenzae NOT DETECTED NOT DETECTED Final   Neisseria meningitidis NOT DETECTED NOT DETECTED Final   Pseudomonas aeruginosa NOT DETECTED NOT DETECTED Final   Stenotrophomonas maltophilia  NOT DETECTED NOT DETECTED Final   Candida albicans NOT DETECTED NOT DETECTED Final   Candida auris NOT DETECTED NOT DETECTED Final   Candida glabrata NOT DETECTED NOT DETECTED Final   Candida krusei NOT DETECTED NOT DETECTED Final   Candida parapsilosis NOT DETECTED NOT DETECTED Final   Candida tropicalis NOT DETECTED NOT DETECTED Final   Cryptococcus neoformans/gattii NOT DETECTED NOT DETECTED Final   Meth resistant mecA/C and MREJ NOT DETECTED  NOT DETECTED Final    Comment: Performed at [facility], [address] [place], [place] [address]  Resp Panel by RT-PCR (Flu A&B, Covid) Nasopharyngeal Swab     Status: None   Collection Time: 11/13/2020  6:16 PM   Specimen: Nasopharyngeal Swab; Nasopharyngeal(NP) swabs in vial transport medium  Result Value Ref Range Status   SARS Coronavirus 2 by RT PCR NEGATIVE NEGATIVE Final    Comment: (NOTE) SARS-CoV-2 target nucleic acids are NOT DETECTED.  The SARS-CoV-2 RNA is generally detectable in upper respiratory specimens during the acute phase of infection. The lowest concentration of SARS-CoV-2 viral copies this assay can detect is 138 copies/mL. A negative result does not preclude SARS-Cov-2 infection and should not be used as the sole basis for treatment or other patient management decisions. A negative result may occur with  improper specimen collection/handling, submission of specimen other than nasopharyngeal swab, presence of viral mutation(s) within the areas targeted by this assay, and inadequate number of viral copies(<138 copies/mL). A negative result must be combined with clinical observations, patient history, and epidemiological information. The expected result is Negative.  Fact Sheet for Patients:  BloggerCourse.com  Fact Sheet for Healthcare Providers:  SeriousBroker.it  This test is no t yet approved or cleared by the [place] FDA and  has been authorized for  detection and/or diagnosis of SARS-CoV-2 by FDA under an Emergency Use Authorization (EUA). This EUA will remain  in effect (meaning this test can be used) for the duration of the COVID-19 declaration under Section 564(b)(1) of the Act, 21 U.S.C.section 360bbb-3(b)(1), unless the authorization is terminated  or revoked sooner.       Influenza A by PCR NEGATIVE NEGATIVE Final   Influenza B by PCR NEGATIVE NEGATIVE Final    Comment: (NOTE) The Xpert Xpress SARS-CoV-2/FLU/RSV plus assay is intended as an aid in the diagnosis of influenza from Nasopharyngeal swab specimens and should not be used as a sole basis for treatment. Nasal washings and aspirates are unacceptable for Xpert Xpress SARS-CoV-2/FLU/RSV testing.  Fact Sheet for Patients: BloggerCourse.com  Fact Sheet for Healthcare Providers: SeriousBroker.it  This test is not yet approved or cleared by the [place] FDA and has been authorized for detection and/or diagnosis of SARS-CoV-2 by FDA under an Emergency Use Authorization (EUA). This EUA will remain in effect (meaning this test can be used) for the duration of the COVID-19 declaration under Section 564(b)(1) of the Act, 21 U.S.C. section 360bbb-3(b)(1), unless the authorization is terminated or revoked.  Performed at [facility], [address], [place], [place] [address]   Culture, Urine     Status: Abnormal   Collection Time: 10/31/2020 10:36 PM   Specimen: Urine, Clean Catch  Result Value Ref Range Status   Specimen Description   Final    URINE, CLEAN CATCH Performed at [facility], [address], [place], [place] [address]    Special Requests   Final    NONE Performed at [facility], [address] [place], [place] [address]    Culture >=100,000 COLONIES/mL STAPHYLOCOCCUS AUREUS (A)  Final   Report Status 10/26/2020 FINAL  Final   Organism ID, Bacteria STAPHYLOCOCCUS AUREUS (A)  Final       Susceptibility   Staphylococcus aureus - MIC*    CIPROFLOXACIN <=0.5 SENSITIVE Sensitive     GENTAMICIN <=0.5 SENSITIVE Sensitive     NITROFURANTOIN <=16 SENSITIVE Sensitive     OXACILLIN <=0.25 SENSITIVE Sensitive     TETRACYCLINE <=1 SENSITIVE Sensitive  VANCOMYCIN <=0.5 SENSITIVE Sensitive     TRIMETH/SULFA <=10 SENSITIVE Sensitive     CLINDAMYCIN <=0.25 SENSITIVE Sensitive     RIFAMPIN <=0.5 SENSITIVE Sensitive     Inducible Clindamycin NEGATIVE Sensitive     * >=100,000 COLONIES/mL STAPHYLOCOCCUS AUREUS  MRSA PCR Screening     Status: None   Collection Time: 11/13/2020  3:13 PM   Specimen: Nasopharyngeal  Result Value Ref Range Status   MRSA by PCR NEGATIVE NEGATIVE Final    Comment:        The GeneXpert MRSA Assay (FDA approved for NASAL specimens only), is one component of a comprehensive MRSA colonization surveillance program. It is not intended to diagnose MRSA infection nor to guide or monitor treatment for MRSA infections. Performed at [facility], [address], [place], [place] [address]   Aerobic/Anaerobic Culture w Gram Stain (surgical/deep wound)     Status: None (Preliminary result)   Collection Time: 11/10/2020  7:58 PM   Specimen: Wound  Result Value Ref Range Status   Specimen Description   Final    WOUND Performed at [facility], [address] [place], [place] [address]    Special Requests RIGHT HAMSTRING PATIENT ON FOLLOWING MERREM  Final   Gram Stain   Final    FEW WBC PRESENT,BOTH PMN AND MONONUCLEAR ABUNDANT GRAM POSITIVE COCCI IN PAIRS IN CLUSTERS    Culture   Final    ABUNDANT STAPHYLOCOCCUS AUREUS CULTURE REINCUBATED FOR BETTER GROWTH Performed at [facility], [address] [place], [place] [address]    Report Status PENDING  Incomplete   Organism ID, Bacteria STAPHYLOCOCCUS AUREUS  Final      Susceptibility   Staphylococcus aureus - MIC*    CIPROFLOXACIN <=0.5 SENSITIVE Sensitive      ERYTHROMYCIN <=0.25 SENSITIVE Sensitive     GENTAMICIN <=0.5 SENSITIVE Sensitive     OXACILLIN <=0.25 SENSITIVE Sensitive     TETRACYCLINE <=1 SENSITIVE Sensitive     VANCOMYCIN <=0.5 SENSITIVE Sensitive     TRIMETH/SULFA <=10 SENSITIVE Sensitive     CLINDAMYCIN <=0.25 SENSITIVE Sensitive     RIFAMPIN <=0.5 SENSITIVE Sensitive     Inducible Clindamycin NEGATIVE Sensitive     * ABUNDANT STAPHYLOCOCCUS AUREUS  Culture, blood (routine x 2)     Status: None (Preliminary result)   Collection Time: 10/24/20 11:48 AM   Specimen: BLOOD RIGHT HAND  Result Value Ref Range Status   Specimen Description   Final    BLOOD RIGHT HAND Performed at [facility], [address] [place], [place] [address]    Special Requests   Final    BOTTLES DRAWN AEROBIC AND ANAEROBIC Blood Culture adequate volume Performed at [facility], [address] [place], [place] [address]    Culture   Final    NO GROWTH 2 DAYS Performed at [facility], [address] [place], [place] [address]    Report Status PENDING  Incomplete  Culture, blood (routine x 2)     Status: None (Preliminary result)   Collection Time: 10/24/20 11:53 AM   Specimen: BLOOD RIGHT HAND  Result Value Ref Range Status   Specimen Description   Final    BLOOD RIGHT HAND Performed at [facility], [address], [place], [place] [address]    Special Requests   Final    BOTTLES DRAWN AEROBIC AND ANAEROBIC Blood Culture adequate volume Performed at [facility], [address] [place], [place] [address]    Culture   Final    NO GROWTH 2 DAYS Performed at [facility], [address] [place], [place] [address]    Report Status PENDING  Incomplete     Serology:   Imaging: If present, new imagings (plain films, ct scans, and mri) have been personally visualized and interpreted; radiology reports have been reviewed. Decision making incorporated  into the Impression / Recommendations.  4/02 ct femur right Elongated gas and fluid collection noted along the surface of the hamstring muscles, in some areas extending deep into the hamstring muscles compatible with intramuscular abscess. This extends over an extensive area in length extending from the soft tissue defect in the proximal posteromedial right thigh distally to just above the knee.  No evidence of osteomyelitis.   4/03 ct left forearm Edema and locules of gas throughout the subcutaneous soft tissues in the anterior distal forearm. No well-defined focal abscess. This could be further evaluated with ultrasound if felt clinically Indicated.   Raymondo Bandrung T Zaylyn Bergdoll, MD [facility] [phone] pager    10/26/2020, 4:11 PM

## 2020-10-26 NOTE — Progress Notes (Signed)
Inpatient Diabetes Program Recommendations  AACE/ADA: New Consensus Statement on Inpatient Glycemic Control (2015)  Target Ranges:  Prepandial:   less than 140 mg/dL      Peak postprandial:   less than 180 mg/dL (1-2 hours)      Critically ill patients:  140 - 180 mg/dL   Lab Results  Component Value Date   GLUCAP 282 (H) 10/26/2020   HGBA1C 12.9 (H) 10/24/2020    Review of Glycemic Control  Diabetes history: DM2 Outpatient Diabetes medications: Levemir 23 units BID, Humalog 16 units TID (not taking both insulins) Current orders for Inpatient glycemic control: Lantus 10 units QHS, Novolog 0-9 units TID with meals and 0-5 HS + 4 units TID  HgbA1C - 12.9% - likely not accurate with low H/H  Inpatient Diabetes Program Recommendations:     Increase Lantus to 20 units QHS  Follow closely.  Thank you. Ailene Ards, RD, LDN, CDE Inpatient Diabetes Coordinator [phone]

## 2020-10-26 NOTE — Progress Notes (Incomplete)
Patient continuously had bloody bowel movements throughout the night. Last BM this morning was mostly bright red blood with little stool. BP's soft, titrating upwards on pressors.

## 2020-10-26 NOTE — Progress Notes (Addendum)
eLink Physician-Brief Progress Note Patient Name: Ronald Brady DOB: 1961/04/09 MRN: [id number]   Date of Service  10/26/2020  HPI/Events of Note  Multiple issues: 1. Hypovolemia - CVP = 7 and INR = 1.4    eICU Interventions  Plan: 1. Transfuse 2 units FFP now. 2. Protonix IV bolus and infusion.  3. Bolus with 0.9 NaCl 500 mL IV over 30 minutes now.      Intervention Category Major Interventions: Other:;Hypovolemia - evaluation and treatment with fluids  Dream Harman Dennard Nip 10/26/2020, 3:12 AM

## 2020-10-26 NOTE — Progress Notes (Signed)
Pharmacy Antibiotic Note  Ronald Brady is a 60 y.o. male admitted on 11/15/2020 with skin infection and bacteremia.  Pharmacy has been consulted for cefazolin dosing for MSSA bacteremia. 10/26/2020  D#4 ancef, D#5 total abx MSSA abscess of R thigh, L wrist MSSA bacteremia MSSA UTI Levophed @ 7 mcg/min AF, WBC 12.8, SCr 0.52 4/4 TTE neg for vegetation   Plan: Continue Cefazolin 2 gm IV q 8 hours.  Will f/u renal function, culture results, and clinical course.  F/u ID recs  Height: 5\' 5"  (165.1 cm) Weight: 75.7 kg (166 lb 14.2 oz) IBW/kg (Calculated) : 61.5  Temp (24hrs), Avg:98.3 F (36.8 C), Min:98 F (36.7 C), Max:98.5 F (36.9 C)  Recent Labs  Lab 10/23/2020 1708 11/08/2020 1900 11/19/2020 2157 10/30/2020 2311 11/04/2020 0319 10/25/2020 1315 11/04/2020 1526 10/24/20 0839 10/24/20 1150 10/24/20 1830 10/25/20 0356 10/25/20 1953 10/26/20 0353  WBC  --   --   --   --   --   --  10.3 8.2  --   --  8.7 8.4 12.8*  CREATININE  --   --   --    < > 0.83   < > 0.69 0.50*  --  0.49* 0.52*  --  0.52*  LATICACIDVEN 3.7* 3.8* 7.2*  --  2.6*  --   --   --  1.5  --   --   --   --    < > = values in this interval not displayed.    Estimated Creatinine Clearance: 95.7 mL/min (A) (by C-G formula based on SCr of 0.52 mg/dL (L)).    Allergies  Allergen Reactions  . Codeine Nausea And Vomiting  . Penicillins Other (See Comments)    Pt is unsure of reaction. States PCP told him he was allergic. Did it involve swelling of the face/tongue/throat, SOB, or low BP? Unknown Did it involve sudden or severe rash/hives, skin peeling, or any reaction on the inside of your mouth or nose? Unknown Did you need to seek medical attention at a hospital or doctor's office? Unknown When did it last happen?unk If all above answers are "NO", may proceed with cephalosporin use.    Antimicrobials this admission: 4/2 vancomycin x 1  4/3 fluconazole x 1 4/2 clindamycin >> 4/3 4/2 meropneme >> 4/3 4/3  cefazolin >> Dose adjustments this admission: Microbiology results: 4/2 BCx: GPC in 3/4, MSSA per BCID 4/2 UCx: > 100 K MSSA 4/3 MRSA PCR: neg 4/3 R hamstring wound: abundant MSSA  4/4 BCx2: ngtd  Thank you for allowing pharmacy to be a part of this patient's care.  6/4, Pharm.D 10/26/2020 1:26 PM

## 2020-10-26 NOTE — Anesthesia Preprocedure Evaluation (Addendum)
Anesthesia Evaluation  Patient identified by MRN, date of birth, ID band Patient awake    Reviewed: Allergy & Precautions, H&P , NPO status , Patient's Chart, lab work & pertinent test results  Airway Mallampati: II   Neck ROM: full    Dental   Pulmonary COPD,  COPD inhaler, Current Smoker,    breath sounds clear to auscultation       Cardiovascular hypertension,  Rhythm:regular Rate:Normal  10/24/2020 ECHO: EF 60-65%, no significant valvular abnormalities  Pt is currently on levo gtt   Neuro/Psych    GI/Hepatic   Endo/Other  diabetes (glu 284), Insulin Dependent, Oral Hypoglycemic Agents  Renal/GU      Musculoskeletal   Abdominal   Peds  Hematology  (+) Blood dyscrasia (Hb 6.2, INR 1.4), anemia ,   Anesthesia Other Findings Sepsis: thigh, wrist infections  Reproductive/Obstetrics                          Anesthesia Physical Anesthesia Plan  ASA: III  Anesthesia Plan: General   Post-op Pain Management:    Induction: Intravenous  PONV Risk Score and Plan: 1 and Ondansetron and Dexamethasone  Airway Management Planned: Oral ETT  Additional Equipment: Arterial line  Intra-op Plan:   Post-operative Plan: Extubation in OR  Informed Consent: I have reviewed the patients History and Physical, chart, labs and discussed the procedure including the risks, benefits and alternatives for the proposed anesthesia with the patient or authorized representative who has indicated his/her understanding and acceptance.     Dental advisory given  Plan Discussed with: CRNA, Anesthesiologist and Surgeon  Anesthesia Plan Comments: (12:50 discussed Hb 6.2 with Critical Care PA. He will contact pt's team to initiate transfusion and resuscitation prior to OR  )     Anesthesia Quick Evaluation

## 2020-10-26 NOTE — Progress Notes (Addendum)
eLink Physician-Brief Progress Note Patient Name: Ronald Brady DOB: 1961-06-14 MRN: [id number]   Date of Service  10/26/2020  HPI/Events of Note  Patient still having BRBPR. Last Hgb = 5.9. He has been transfused 2 units PRBC. Post transfusion H/H pending. BP = 108/69 on Norepinephrine IV infusion at 3 mcg/min which has weaned from about 6 mcg/min.   eICU Interventions  Plan: 1. PT/INR and PTT STAT. 2. Await post transfusion H/H.  3. Monitor CVP now and Q 4 hours. 4. Follow up CBC already ordered for 5 AM.     Intervention Category Major Interventions: Other:  [name] Dennard Nip 10/26/2020, 2:27 AM

## 2020-10-26 NOTE — Progress Notes (Signed)
eLink Physician-Brief Progress Note Patient Name: Ronald Brady DOB: 20-Sep-1960 MRN: [id number]   Date of Service  10/26/2020  HPI/Events of Note  Patient NPO for GI bleed. Scheduled to get Lantus 20 units at bedtime daily. Blood glucose = 265. Also has Q 4 hour Novolog SSI.  eICU Interventions  Plan: 1. Decrease Lantus to 10 units Real at bedtime daily.      Intervention Category Major Interventions: Other:  [name] 10/26/2020, 12:12 AM

## 2020-10-27 ENCOUNTER — Inpatient Hospital Stay (HOSPITAL_COMMUNITY): Payer: Medicaid Other | Admitting: Certified Registered Nurse Anesthetist

## 2020-10-27 ENCOUNTER — Inpatient Hospital Stay (HOSPITAL_COMMUNITY): Payer: Medicaid Other

## 2020-10-27 ENCOUNTER — Encounter (HOSPITAL_COMMUNITY): Admission: EM | Disposition: E | Payer: Self-pay | Source: Home / Self Care | Attending: Pulmonary Disease

## 2020-10-27 DIAGNOSIS — L02414 Cutaneous abscess of left upper limb: Secondary | ICD-10-CM | POA: Diagnosis not present

## 2020-10-27 DIAGNOSIS — R7881 Bacteremia: Secondary | ICD-10-CM | POA: Diagnosis not present

## 2020-10-27 DIAGNOSIS — A4101 Sepsis due to Methicillin susceptible Staphylococcus aureus: Secondary | ICD-10-CM | POA: Diagnosis not present

## 2020-10-27 DIAGNOSIS — A419 Sepsis, unspecified organism: Secondary | ICD-10-CM | POA: Diagnosis not present

## 2020-10-27 DIAGNOSIS — L02415 Cutaneous abscess of right lower limb: Secondary | ICD-10-CM | POA: Diagnosis not present

## 2020-10-27 HISTORY — PX: I & D EXTREMITY: SHX5045

## 2020-10-27 HISTORY — PX: DRESSING CHANGE UNDER ANESTHESIA: SHX5237

## 2020-10-27 LAB — CBC
HCT: 26.8 % — ABNORMAL LOW (ref 39.0–52.0)
HCT: 25.2 % — ABNORMAL LOW (ref 39.0–52.0)
Hemoglobin: 9.1 g/dL — ABNORMAL LOW (ref 13.0–17.0)
Hemoglobin: 8.5 g/dL — ABNORMAL LOW (ref 13.0–17.0)
MCH: 28.7 pg (ref 26.0–34.0)
MCH: 28.3 pg (ref 26.0–34.0)
MCHC: 34 g/dL (ref 30.0–36.0)
MCHC: 33.7 g/dL (ref 30.0–36.0)
MCV: 84.5 fL (ref 80.0–100.0)
MCV: 84 fL (ref 80.0–100.0)
Platelets: 122 10*3/uL — ABNORMAL LOW (ref 150–400)
Platelets: 135 10*3/uL — ABNORMAL LOW (ref 150–400)
RBC: 3.17 MIL/uL — ABNORMAL LOW (ref 4.22–5.81)
RBC: 3 MIL/uL — ABNORMAL LOW (ref 4.22–5.81)
RDW: 15.9 % — ABNORMAL HIGH (ref 11.5–15.5)
RDW: 15.7 % — ABNORMAL HIGH (ref 11.5–15.5)
WBC: 9.3 10*3/uL (ref 4.0–10.5)
WBC: 8.4 10*3/uL (ref 4.0–10.5)
nRBC: 0 % (ref 0.0–0.2)
nRBC: 0 % (ref 0.0–0.2)

## 2020-10-27 LAB — PREPARE FRESH FROZEN PLASMA
Unit division: 0
Unit division: 0

## 2020-10-27 LAB — CULTURE, BLOOD (ROUTINE X 2)

## 2020-10-27 LAB — HEMOGLOBIN AND HEMATOCRIT, BLOOD
HCT: 23 % — ABNORMAL LOW (ref 39.0–52.0)
Hemoglobin: 7.8 g/dL — ABNORMAL LOW (ref 13.0–17.0)

## 2020-10-27 LAB — BPAM FFP
Blood Product Expiration Date: 202204070446
Blood Product Expiration Date: 202204070446
ISSUE DATE / TIME: 202204060527
ISSUE DATE / TIME: 202204060652
Unit Type and Rh: 5100
Unit Type and Rh: 5100

## 2020-10-27 LAB — POCT I-STAT 7, (LYTES, BLD GAS, ICA,H+H)
Acid-Base Excess: 6 mmol/L — ABNORMAL HIGH (ref 0.0–2.0)
Bicarbonate: 30.7 mmol/L — ABNORMAL HIGH (ref 20.0–28.0)
Calcium, Ion: 1.17 mmol/L (ref 1.15–1.40)
HCT: 19 % — ABNORMAL LOW (ref 39.0–52.0)
Hemoglobin: 6.5 g/dL — CL (ref 13.0–17.0)
O2 Saturation: 100 %
Potassium: 3.9 mmol/L (ref 3.5–5.1)
Sodium: 140 mmol/L (ref 135–145)
TCO2: 32 mmol/L (ref 22–32)
pCO2 arterial: 42.7 mmHg (ref 32.0–48.0)
pH, Arterial: 7.465 — ABNORMAL HIGH (ref 7.350–7.450)
pO2, Arterial: 252 mmHg — ABNORMAL HIGH (ref 83.0–108.0)

## 2020-10-27 LAB — GLUCOSE, CAPILLARY
Glucose-Capillary: 188 mg/dL — ABNORMAL HIGH (ref 70–99)
Glucose-Capillary: 147 mg/dL — ABNORMAL HIGH (ref 70–99)
Glucose-Capillary: 196 mg/dL — ABNORMAL HIGH (ref 70–99)
Glucose-Capillary: 118 mg/dL — ABNORMAL HIGH (ref 70–99)
Glucose-Capillary: 134 mg/dL — ABNORMAL HIGH (ref 70–99)
Glucose-Capillary: 226 mg/dL — ABNORMAL HIGH (ref 70–99)

## 2020-10-27 LAB — BASIC METABOLIC PANEL
Anion gap: 7 (ref 5–15)
BUN: 24 mg/dL — ABNORMAL HIGH (ref 6–20)
CO2: 26 mmol/L (ref 22–32)
Calcium: 7.5 mg/dL — ABNORMAL LOW (ref 8.9–10.3)
Chloride: 107 mmol/L (ref 98–111)
Creatinine, Ser: 0.62 mg/dL (ref 0.61–1.24)
GFR, Estimated: >60 mL/min (ref >60)
Glucose, Bld: 195 mg/dL — ABNORMAL HIGH (ref 70–99)
Potassium: 3.3 mmol/L — ABNORMAL LOW (ref 3.5–5.1)
Sodium: 140 mmol/L (ref 135–145)

## 2020-10-27 SURGERY — IRRIGATION AND DEBRIDEMENT EXTREMITY
Anesthesia: General | Site: Wrist | Laterality: Right

## 2020-10-27 MED ORDER — ROCURONIUM BROMIDE 10 MG/ML (PF) SYRINGE
PREFILLED_SYRINGE | INTRAVENOUS | Status: DC | PRN
  Administered 2020-10-27: 15 mg via INTRAVENOUS
  Administered 2020-10-27: 50 mg via INTRAVENOUS

## 2020-10-27 MED ORDER — ONDANSETRON HCL 4 MG/2ML IJ SOLN: 4.0000 mg | Freq: Four times a day (QID) | INTRAMUSCULAR | Status: DC | PRN

## 2020-10-27 MED ORDER — CEFAZOLIN SODIUM-DEXTROSE 2-4 GM/100ML-% IV SOLN
INTRAVENOUS | Status: AC
  Filled 2020-10-27: qty 100

## 2020-10-27 MED ORDER — OXYCODONE HCL 5 MG PO TABS: 5.0000 mg | ORAL_TABLET | Freq: Once | ORAL | Status: DC | PRN

## 2020-10-27 MED ORDER — SUGAMMADEX SODIUM 200 MG/2ML IV SOLN
INTRAVENOUS | Status: DC | PRN
  Administered 2020-10-27: 200 mg via INTRAVENOUS

## 2020-10-27 MED ORDER — PROPOFOL 10 MG/ML IV BOLUS
INTRAVENOUS | Status: AC
  Filled 2020-10-27: qty 20

## 2020-10-27 MED ORDER — LIDOCAINE HCL (CARDIAC) PF 100 MG/5ML IV SOSY
PREFILLED_SYRINGE | INTRAVENOUS | Status: DC | PRN
  Administered 2020-10-27: 60 mg via INTRAVENOUS

## 2020-10-27 MED ORDER — ROCURONIUM BROMIDE 10 MG/ML (PF) SYRINGE
PREFILLED_SYRINGE | INTRAVENOUS | Status: AC
  Filled 2020-10-27: qty 10

## 2020-10-27 MED ORDER — OXYCODONE HCL 5 MG/5ML PO SOLN: 5.0000 mg | Freq: Once | ORAL | Status: DC | PRN

## 2020-10-27 MED ORDER — FENTANYL CITRATE (PF) 100 MCG/2ML IJ SOLN
INTRAMUSCULAR | Status: AC
  Filled 2020-10-27: qty 2

## 2020-10-27 MED ORDER — POTASSIUM CHLORIDE 10 MEQ/50ML IV SOLN
10.0000 meq | INTRAVENOUS | Status: AC
  Administered 2020-10-27 (×4): 10 meq via INTRAVENOUS
  Filled 2020-10-27 (×4): qty 50

## 2020-10-27 MED ORDER — FENTANYL CITRATE (PF) 250 MCG/5ML IJ SOLN
INTRAMUSCULAR | Status: DC | PRN
  Administered 2020-10-27 (×3): 50 ug via INTRAVENOUS

## 2020-10-27 MED ORDER — LACTATED RINGERS IV SOLN
INTRAVENOUS | Status: DC
Start: 2020-10-27 — End: 2020-10-28

## 2020-10-27 MED ORDER — 0.9 % SODIUM CHLORIDE (POUR BTL) OPTIME
TOPICAL | Status: DC | PRN
  Administered 2020-10-27: 1000 mL

## 2020-10-27 MED ORDER — POTASSIUM CHLORIDE CRYS ER 20 MEQ PO TBCR
20.0000 meq | EXTENDED_RELEASE_TABLET | ORAL | Status: AC
  Administered 2020-10-27: 20 meq via ORAL
  Filled 2020-10-27: qty 1

## 2020-10-27 MED ORDER — SODIUM CHLORIDE 0.9 % IR SOLN
Status: DC | PRN
  Administered 2020-10-27: 3000 mL

## 2020-10-27 MED ORDER — PROPOFOL 10 MG/ML IV BOLUS
INTRAVENOUS | Status: DC | PRN
  Administered 2020-10-27: 100 mg via INTRAVENOUS

## 2020-10-27 MED ORDER — LIDOCAINE 2% (20 MG/ML) 5 ML SYRINGE
INTRAMUSCULAR | Status: AC
  Filled 2020-10-27: qty 5

## 2020-10-27 MED ORDER — BUPIVACAINE HCL (PF) 0.5 % IJ SOLN
INTRAMUSCULAR | Status: AC
  Filled 2020-10-27: qty 30

## 2020-10-27 MED ORDER — KETOROLAC TROMETHAMINE 30 MG/ML IJ SOLN
30.0000 mg | Freq: Four times a day (QID) | INTRAMUSCULAR | Status: AC
  Administered 2020-10-27 – 2020-10-28 (×3): 30 mg via INTRAVENOUS
  Filled 2020-10-27 (×4): qty 1

## 2020-10-27 MED ORDER — FENTANYL CITRATE (PF) 100 MCG/2ML IJ SOLN: 25.0000 ug | INTRAMUSCULAR | Status: DC | PRN

## 2020-10-27 SURGICAL SUPPLY — 36 items
APL PRP STRL LF DISP 70% ISPRP (MISCELLANEOUS) ×2
APL SKNCLS STERI-STRIP NONHPOA (GAUZE/BANDAGES/DRESSINGS) ×8
BENZOIN TINCTURE PRP APPL 2/3 (GAUZE/BANDAGES/DRESSINGS) ×4 IMPLANT
BNDG ELASTIC 3X5.8 VLCR STR LF (GAUZE/BANDAGES/DRESSINGS) ×1 IMPLANT
BNDG ELASTIC 6X5.8 VLCR STR LF (GAUZE/BANDAGES/DRESSINGS) ×1 IMPLANT
CHLORAPREP W/TINT 26 (MISCELLANEOUS) ×3 IMPLANT
COVER SURGICAL LIGHT HANDLE (MISCELLANEOUS) ×3 IMPLANT
DRAPE ORTHO SPLIT 77X108 STRL (DRAPES) ×3
DRAPE SURG ORHT 6 SPLT 77X108 (DRAPES) IMPLANT
DRAPE U-SHAPE 47X51 STRL (DRAPES) ×3 IMPLANT
DRSG PAD ABDOMINAL 8X10 ST (GAUZE/BANDAGES/DRESSINGS) ×6 IMPLANT
DRSG VAC ATS SM SENSATRAC (GAUZE/BANDAGES/DRESSINGS) ×2 IMPLANT
ELECT REM PT RETURN 15FT ADLT (MISCELLANEOUS) ×3 IMPLANT
GAUZE PACKING 1/2X5YD (GAUZE/BANDAGES/DRESSINGS) ×1 IMPLANT
GAUZE SPONGE 4X4 12PLY STRL (GAUZE/BANDAGES/DRESSINGS) ×2 IMPLANT
GLOVE ORTHO TXT STRL SZ7.5 (GLOVE) ×3 IMPLANT
GLOVE SRG 8 PF TXTR STRL LF DI (GLOVE) ×2 IMPLANT
GLOVE SURG ENC MOIS LTX SZ8 (GLOVE) ×1 IMPLANT
GLOVE SURG LTX SZ8 (GLOVE) ×1 IMPLANT
GLOVE SURG UNDER POLY LF SZ8 (GLOVE) ×3
GOWN STRL REUS W/ TWL XL LVL3 (GOWN DISPOSABLE) IMPLANT
GOWN STRL REUS W/TWL LRG LVL3 (GOWN DISPOSABLE) ×3 IMPLANT
GOWN STRL REUS W/TWL XL LVL3 (GOWN DISPOSABLE) ×9 IMPLANT
HANDPIECE INTERPULSE COAX TIP (DISPOSABLE) ×3
KIT BASIN OR (CUSTOM PROCEDURE TRAY) ×3 IMPLANT
KIT TURNOVER KIT A (KITS) ×3 IMPLANT
PACK ORTHO EXTREMITY (CUSTOM PROCEDURE TRAY) ×3 IMPLANT
PENCIL SMOKE EVACUATOR (MISCELLANEOUS) ×1 IMPLANT
PROTECTOR NERVE ULNAR (MISCELLANEOUS) ×2 IMPLANT
SET HNDPC FAN SPRY TIP SCT (DISPOSABLE) IMPLANT
SPONGE LAP 18X18 RF (DISPOSABLE) ×1 IMPLANT
SUT ETHILON 2 0 PS N (SUTURE) IMPLANT
TOWEL OR 17X26 10 PK STRL BLUE (TOWEL DISPOSABLE) ×3 IMPLANT
TOWEL OR NON WOVEN STRL DISP B (DISPOSABLE) ×3 IMPLANT
TUBING CONNECTING 10 (TUBING) ×1 IMPLANT
YANKAUER SUCT BULB TIP NO VENT (SUCTIONS) ×1 IMPLANT

## 2020-10-27 NOTE — Transfer of Care (Signed)
Immediate Anesthesia Transfer of Care Note  Patient: Ronald Brady [facility]  Procedure(s) Performed: IRRIGATION AND DEBRIDEMENT DISTAL THIGH WITH WOUND VAC REMOVAL & APPLICATION (Right Arm Lower) REAPPLICATION OF LEFT WRIST DRESSING, PACKED (Left Wrist)  Patient Location: PACU  Anesthesia Type:General  Level of Consciousness: awake and alert   Airway & Oxygen Therapy: Patient Spontanous Breathing and Patient connected to face mask oxygen  Post-op Assessment: Report given to RN and Post -op Vital signs reviewed and stable  Post vital signs: Reviewed and stable  Last Vitals:  Vitals Value Taken Time  BP 121/77 10/25/2020 2000  Temp    Pulse 100 11/19/2020 2003  Resp 34 11/16/2020 2003  SpO2 100 % 10/31/2020 2003  Vitals shown include unvalidated device data.  Last Pain:  Vitals:   10/24/2020 1740  TempSrc: Oral  PainSc:       Patients Stated Pain Goal: 0 (11/15/2020 0921)  Complications: No complications documented.

## 2020-10-27 NOTE — Progress Notes (Signed)
Changed right upper leg wound vac dressing per orders as previous dressing soiled with feces.

## 2020-10-27 NOTE — H&P (View-Only) (Signed)
Subjective: 4 Days Post-Op Procedure(s) (LRB): INCISION AND DRAINAGE  AND DEBRIDEMENT LEFT WRIST AND RIGHT POSTERIOR THIGH APPLICATION OF WOUND VACS TO LEFT WRIST WOUND AND RIGHT POSTERIOR THIGH WOUND (Left) Patient reports pain as moderate and severe.    Objective: Vital signs in last 24 hours: Temp:  [97.1 F (36.2 C)-98.5 F (36.9 C)] 98 F (36.7 C) (04/07 0400) Pulse Rate:  [56-119] 105 (04/07 0630) Resp:  [4-31] 27 (04/07 0630) BP: (75-171)/(48-103) 116/103 (04/07 0630) SpO2:  [95 %-100 %] 99 % (04/07 0630)  Intake/Output from previous day: 04/06 0701 - 04/07 0700 In: 1459.6 [P.O.:100; I.V.:338.8; BJYNW:295; IV Piggyback:196.8] Out: 2150 [Urine:1650; Drains:500] Intake/Output this shift: No intake/output data recorded.  Recent Labs    10/26/20 0352 10/26/20 0353 10/26/20 1110 10/24/2020 0021 11/09/2020 0453  HGB 7.3* 7.3* 6.2* 9.1* 8.5*   Recent Labs    10/25/2020 0021 11/14/2020 0453  WBC 9.3 8.4  RBC 3.17* 3.00*  HCT 26.8* 25.2*  PLT 122* 135*   Recent Labs    10/26/20 0353 11/09/2020 0453  NA 141 140  K 3.8 3.3*  CL 108 107  CO2 28 26  BUN 25* 24*  CREATININE 0.52* 0.62  GLUCOSE 320* 195*  CALCIUM 6.8* 7.5*   Recent Labs    10/26/20 0227  INR 1.4*    VAC still with drainage. DG CHEST PORT 1 VIEW  Result Date: 11/16/2020 CLINICAL DATA:  Bacteremia. EXAM: PORTABLE CHEST 1 VIEW COMPARISON:  Chest x-ray 11/16/2020. FINDINGS: Right IJ line in stable position. Heart size normal. Low lung volumes. Bibasilar infiltrates. Small left pleural effusion cannot be excluded. No pneumothorax. IMPRESSION: 1.  Right IJ line stable position. 2. Low lung volumes with bibasilar infiltrates. Small left pleural effusion cannot be excluded. Electronically Signed   By: Maisie Fus  Register   On: 10/25/2020 05:21   CT Angio Abd/Pel w/ and/or w/o  Result Date: 10/26/2020 CLINICAL DATA:  GI bleed. Abdominal pain. Severe sepsis and septic shock. EXAM: CTA ABDOMEN AND PELVIS WITHOUT  AND WITH CONTRAST TECHNIQUE: Multidetector CT imaging of the abdomen and pelvis was performed using the standard protocol during bolus administration of intravenous contrast. Multiplanar reconstructed images and MIPs were obtained and reviewed to evaluate the vascular anatomy. CONTRAST:  OMNIPAQUE IOHEXOL 350 MG/ML SOLN COMPARISON:  None. FINDINGS: VASCULAR Aorta: Normal caliber aorta without aneurysm, dissection, vasculitis or significant stenosis. Celiac: Patent without evidence of aneurysm, dissection, vasculitis or significant stenosis. SMA: Patent without evidence of aneurysm, dissection, vasculitis or significant stenosis. Renals: Both renal arteries are patent without evidence of aneurysm, dissection, vasculitis, fibromuscular dysplasia or significant stenosis. IMA: Patent without evidence of aneurysm, dissection, vasculitis or significant stenosis. Inflow: Patent without evidence of aneurysm, dissection, vasculitis or significant stenosis. Proximal Outflow: Bilateral common femoral and visualized portions of the superficial and profunda femoral arteries are patent without evidence of aneurysm, dissection, vasculitis or significant stenosis. Veins: No obvious venous abnormality within the limitations of this arterial phase study. Review of the MIP images confirms the above findings. NON-VASCULAR Lower chest: There is small bilateral pleural effusions. There is adjacent atelectasis.The heart size is normal. The intracardiac blood pool is hypodense relative to the adjacent myocardium consistent with anemia. Hepatobiliary: The liver is normal. Normal gallbladder.There is no biliary ductal dilation. Pancreas: Normal contours without ductal dilatation. No peripancreatic fluid collection. Spleen: Unremarkable. Adrenals/Urinary Tract: --Adrenal glands: Unremarkable. --Right kidney/ureter: No hydronephrosis or radiopaque kidney stones. --Left kidney/ureter: No hydronephrosis or radiopaque kidney stones.  --Urinary bladder: The urinary bladder is significantly  distended. Stomach/Bowel: --Stomach/Duodenum: There is suggestion of diffuse gastric wall thickening. The distal esophagus appears to be thickened with intraluminal fluid. 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 There appears to be diffuse wall thickening of the duodenum. --Colon: There is mild wall thickening of the rectum. There is no evidence for active arterial GI bleeding. --Appendix: Normal. Vascular/Lymphatic: Atherosclerotic calcification is present within the non-aneurysmal abdominal aorta, without hemodynamically significant stenosis. --No retroperitoneal lymphadenopathy. --No mesenteric lymphadenopathy. --No pelvic or inguinal lymphadenopathy. Reproductive: Unremarkable Other: There is a small volume of free fluid in the abdomen. There is anasarca. There is a small bowel containing umbilical hernia without evidence for obstruction. Musculoskeletal. 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 IMPRESSION: 1. No evidence for active arterial GI bleeding. 2. Diffuse gastric wall thickening and duodenal wall thickening, suggestive of gastritis and duodenitis. Incidentally noted small bowel malrotation. 3. Small bilateral pleural effusions with adjacent atelectasis. 4. Anasarca. 5. Distended urinary bladder. Aortic Atherosclerosis (ICD10-I70.0). Electronically Signed   By: Katherine Mantle M.D.   On: 10/26/2020 23:27    Assessment/Plan: 4 Days Post-Op Procedure(s) (LRB): INCISION AND DRAINAGE  AND DEBRIDEMENT LEFT WRIST AND RIGHT POSTERIOR THIGH APPLICATION OF WOUND VACS TO LEFT WRIST WOUND AND RIGHT POSTERIOR THIGH WOUND (Left) Plan:  NPO after breakfast. Hgb 8.5.  OR today 6 PM for I and D of mid to distal thigh.   Eldred Manges 11/14/2020, 7:41 AM

## 2020-10-27 NOTE — Progress Notes (Signed)
Took pt out to ICU, when arrived Room, pt was awake, scream.oriented. report given to [facility] RN@ bedside

## 2020-10-27 NOTE — Anesthesia Procedure Notes (Signed)
Procedure Name: Intubation Date/Time: 10/31/2020 6:22 PM Performed by: Adalberto Ill, CRNA Pre-anesthesia Checklist: Patient identified, Emergency Drugs available, Suction available and Patient being monitored Patient Re-evaluated:Patient Re-evaluated prior to induction Oxygen Delivery Method: Circle System Utilized Preoxygenation: Pre-oxygenation with 100% oxygen Induction Type: IV induction Ventilation: Mask ventilation without difficulty Laryngoscope Size: Mac and 4 Grade View: Grade II Tube type: Oral Tube size: 7.5 mm Number of attempts: 1 Airway Equipment and Method: Stylet Placement Confirmation: ETT inserted through vocal cords under direct vision,  positive ETCO2 and breath sounds checked- equal and bilateral Secured at: 22 cm Tube secured with: Tape Dental Injury: Teeth and Oropharynx as per pre-operative assessment

## 2020-10-27 NOTE — Progress Notes (Signed)
PT Cancellation Note  Patient Details Name: Ronald Brady MRN: [id number] DOB: Jun 25, 1961   Cancelled Treatment:    Reason Eval/Treat Not Completed: Pain limiting ability to participate Pt declining therapies today.  Pain limiting mobility.  Pt also scheduled for another I & D today. Will check back as schedule permits.   Farra Nikolic,[name] 10/22/2020, 1:12 PM Paulino Door, DPT Acute Rehabilitation Services Pager: [phone] Office: [phone]

## 2020-10-27 NOTE — Progress Notes (Signed)
ICU RN cannot take pt due to emergency situation unit. Asked send 10Pm meds down. Pt is holding 3hrs in PACU.

## 2020-10-27 NOTE — Progress Notes (Signed)
SLP Cancellation Note  Patient Details Name: Ronald Brady MRN: [id number] DOB: [birth year]   Cancelled treatment:       Reason Eval/Treat Not Completed: Other (comment);Medical issues which prohibited therapy (pt npo currently for surgery, will continue efforts)  Rolena Infante, MS [facility] SLP Acute Rehab Services Office [phone] Pager [phone]   Chales Abrahams 10/21/2020, 9:02 AM

## 2020-10-27 NOTE — Anesthesia Procedure Notes (Signed)
Arterial Line Insertion Start/End04/02/2021 6:12 PM, 10/27/2020 6:16 PM Performed by: Achille Rich, MD  Patient location: Pre-op. Preanesthetic checklist: patient identified, IV checked, site marked, risks and benefits discussed, surgical consent, monitors and equipment checked, pre-op evaluation, timeout performed and anesthesia consent Lidocaine 1% used for infiltration Right, radial was placed Catheter size: 20 Fr Hand hygiene performed  and maximum sterile barriers used   Attempts: 1 Procedure performed without using ultrasound guided technique. Following insertion, dressing applied. Post procedure assessment: normal and unchanged

## 2020-10-27 NOTE — Progress Notes (Signed)
K+3.3 ?Replaced per protocol  ?

## 2020-10-27 NOTE — Progress Notes (Signed)
Pt is getting agetated for food.  Attempted to give report to unit, they will call back

## 2020-10-27 NOTE — Progress Notes (Signed)
Suddenly pt. Was not respond twitching eye brows pupiul 3 round non react, VSS  Applied face mask, paged e-link MD , ground care team will be PACU per E-link.started 2250, sponteneous breathing. Ongoing twitching eye brows, not respond

## 2020-10-27 NOTE — Interval H&P Note (Signed)
History and Physical Interval Note:  11-13-20 5:47 PM  Ronald Brady [facility]  has presented today for surgery, with the diagnosis of Right Thigh Abscess.  The various methods of treatment have been discussed with the patient and family. After consideration of risks, benefits and other options for treatment, the patient has consented to  Procedure(s): IRRIGATION AND DEBRIDEMENT EXTREMITY (Right) as a surgical intervention.  The patient's history has been reviewed, patient examined, no change in status, stable for surgery.  I have reviewed the patient's chart and labs.  Questions were answered to the patient's satisfaction.     Eldred Manges

## 2020-10-27 NOTE — Op Note (Addendum)
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  Postop diagnosis: Same  Procedure: Removal of VAC left volar forearm.  Sharp excisional debridement and iodoform packing.  2.  VAC change proximal hamstring.  Sharp excisional debridement of skin subtenons tissue and necrotic hamstring muscle and tendon.  3.  Bilateral irrigation and debridement drainage of mid to distal thigh abscess with second VAC application of the wound right posterior thigh.  Surgeon: UTD  Anesthesia General orotracheal.  EBL: 150 cc.  Procedure after induction general anesthesia orotracheal ovation patient placed prone on chest rolls.  Left arm at his side.  Patient had had hamstring contracture could not extend his knee lacks the last 30 degrees.  Proximal VAC was removed there was obvious purulent material present and prepping was performed with Betadine split sheets were applied extremity sheet for the arm.  VAC been removed from the arm.  Purulence was suctioned from the proximal area of the right posterior hamstring.  Necrotic hamstring muscle and tendon was debrided with a bare rondure sharp excisional debridement with pickup and scissors.  There was a separate area of abscess formation mid to distal thigh and hamstring compartment this area was opened a distance of 4 cm and immediately purulent material was obtained suctioned debrided.  Patient's been growing MSSA.  Necrotic hamstring was delivered from this area.  This extended down to the semitendinosis medial to the knee and pulsatile lavage was used repeat debridement bulb syringe irrigation repeat debridement with a bare rondure removing necrotic hamstring muscle and tendon.  After this performed 2 separate vacs were applied in standard fashion placed at 125 with the black Kussmaul sponges.  The proximal hamstring wound extended all the way down to the pelvis and there was purulent material  originally present coming from that area where the proximal hamstring attached.  Once vacs were sealed with low leak rate attention was turned to the forearm.  With a VAC been removed there is still some palmaris longus present sticking out this was removed.  2 sutures removed and there was some also necrotic tendon which appeared to be the flexor carpi ulnaris.  And this was sharply resected with pickups scalpel and with scissors as well as bare rondure.  Copious irrigation pulse lavage was used.  Iodoform packing was placed in the 3 separate areas followed by 4 x 4's cleaners and Ace wrap.  Patient was transferred recovery in stable condition.  He remains on Levophed pressure drip and is in critical condition due to sepsis.  Debridement type: Excisional Debridement  Side: right  Body Location: thigh    Tools used for debridement: scalpel, scissors and rongeur  Pre-debridement Wound size (cm):   Length: 35 plus cm        Width:n/a     Depth: n/a  Post-debridement Wound size (cm):   Length: 35cm plus      Width: n/a    Depth: n/a   Debridement depth beyond dead/damaged tissue down to healthy viable tissue: no  Tissue layer involved: skin, subcutaneous tissue, muscle / fascia  Nature of tissue removed: Slough, Necrotic, Devitalized Tissue, Non-viable tissue and Purulence  Irrigation volume: 3L   Irrigation fluid type: Normal Saline

## 2020-10-27 NOTE — Progress Notes (Signed)
Subjective: 4 Days Post-Op Procedure(s) (LRB): INCISION AND DRAINAGE  AND DEBRIDEMENT LEFT WRIST AND RIGHT POSTERIOR THIGH APPLICATION OF WOUND VACS TO LEFT WRIST WOUND AND RIGHT POSTERIOR THIGH WOUND (Left) Patient reports pain as moderate and severe.    Objective: Vital signs in last 24 hours: Temp:  [97.1 F (36.2 C)-98.5 F (36.9 C)] 98 F (36.7 C) (04/07 0400) Pulse Rate:  [56-119] 105 (04/07 0630) Resp:  [4-31] 27 (04/07 0630) BP: (75-171)/(48-103) 116/103 (04/07 0630) SpO2:  [95 %-100 %] 99 % (04/07 0630)  Intake/Output from previous day: 04/06 0701 - 04/07 0700 In: 1459.6 [P.O.:100; I.V.:338.8; BJYNW:295; IV Piggyback:196.8] Out: 2150 [Urine:1650; Drains:500] Intake/Output this shift: No intake/output data recorded.  Recent Labs    10/26/20 0352 10/26/20 0353 10/26/20 1110 10/24/2020 0021 11/09/2020 0453  HGB 7.3* 7.3* 6.2* 9.1* 8.5*   Recent Labs    10/25/2020 0021 11/14/2020 0453  WBC 9.3 8.4  RBC 3.17* 3.00*  HCT 26.8* 25.2*  PLT 122* 135*   Recent Labs    10/26/20 0353 11/14/2020 0453  NA 141 140  K 3.8 3.3*  CL 108 107  CO2 28 26  BUN 25* 24*  CREATININE 0.52* 0.62  GLUCOSE 320* 195*  CALCIUM 6.8* 7.5*   Recent Labs    10/26/20 0227  INR 1.4*    VAC still with drainage. DG CHEST PORT 1 VIEW  Result Date: 11/16/2020 CLINICAL DATA:  Bacteremia. EXAM: PORTABLE CHEST 1 VIEW COMPARISON:  Chest x-ray 11/16/2020. FINDINGS: Right IJ line in stable position. Heart size normal. Low lung volumes. Bibasilar infiltrates. Small left pleural effusion cannot be excluded. No pneumothorax. IMPRESSION: 1.  Right IJ line stable position. 2. Low lung volumes with bibasilar infiltrates. Small left pleural effusion cannot be excluded. Electronically Signed   By: Maisie Fus  Register   On: 10/25/2020 05:21   CT Angio Abd/Pel w/ and/or w/o  Result Date: 10/26/2020 CLINICAL DATA:  GI bleed. Abdominal pain. Severe sepsis and septic shock. EXAM: CTA ABDOMEN AND PELVIS WITHOUT  AND WITH CONTRAST TECHNIQUE: Multidetector CT imaging of the abdomen and pelvis was performed using the standard protocol during bolus administration of intravenous contrast. Multiplanar reconstructed images and MIPs were obtained and reviewed to evaluate the vascular anatomy. CONTRAST:  OMNIPAQUE IOHEXOL 350 MG/ML SOLN COMPARISON:  None. FINDINGS: VASCULAR Aorta: Normal caliber aorta without aneurysm, dissection, vasculitis or significant stenosis. Celiac: Patent without evidence of aneurysm, dissection, vasculitis or significant stenosis. SMA: Patent without evidence of aneurysm, dissection, vasculitis or significant stenosis. Renals: Both renal arteries are patent without evidence of aneurysm, dissection, vasculitis, fibromuscular dysplasia or significant stenosis. IMA: Patent without evidence of aneurysm, dissection, vasculitis or significant stenosis. Inflow: Patent without evidence of aneurysm, dissection, vasculitis or significant stenosis. Proximal Outflow: Bilateral common femoral and visualized portions of the superficial and profunda femoral arteries are patent without evidence of aneurysm, dissection, vasculitis or significant stenosis. Veins: No obvious venous abnormality within the limitations of this arterial phase study. Review of the MIP images confirms the above findings. NON-VASCULAR Lower chest: There is small bilateral pleural effusions. There is adjacent atelectasis.The heart size is normal. The intracardiac blood pool is hypodense relative to the adjacent myocardium consistent with anemia. Hepatobiliary: The liver is normal. Normal gallbladder.There is no biliary ductal dilation. Pancreas: Normal contours without ductal dilatation. No peripancreatic fluid collection. Spleen: Unremarkable. Adrenals/Urinary Tract: --Adrenal glands: Unremarkable. --Right kidney/ureter: No hydronephrosis or radiopaque kidney stones. --Left kidney/ureter: No hydronephrosis or radiopaque kidney stones.  --Urinary bladder: The urinary bladder is significantly  distended. Stomach/Bowel: --Stomach/Duodenum: There is suggestion of diffuse gastric wall thickening. The distal esophagus appears to be thickened with intraluminal fluid. 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 There appears to be diffuse wall thickening of the duodenum. --Colon: There is mild wall thickening of the rectum. There is no evidence for active arterial GI bleeding. --Appendix: Normal. Vascular/Lymphatic: Atherosclerotic calcification is present within the non-aneurysmal abdominal aorta, without hemodynamically significant stenosis. --No retroperitoneal lymphadenopathy. --No mesenteric lymphadenopathy. --No pelvic or inguinal lymphadenopathy. Reproductive: Unremarkable Other: There is a small volume of free fluid in the abdomen. There is anasarca. There is a small bowel containing umbilical hernia without evidence for obstruction. Musculoskeletal. 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 IMPRESSION: 1. No evidence for active arterial GI bleeding. 2. Diffuse gastric wall thickening and duodenal wall thickening, suggestive of gastritis and duodenitis. Incidentally noted small bowel malrotation. 3. Small bilateral pleural effusions with adjacent atelectasis. 4. Anasarca. 5. Distended urinary bladder. Aortic Atherosclerosis (ICD10-I70.0). Electronically Signed   By: Christopher  Green M.D.   On: 10/26/2020 23:27    Assessment/Plan: 4 Days Post-Op Procedure(s) (LRB): INCISION AND DRAINAGE  AND DEBRIDEMENT LEFT WRIST AND RIGHT POSTERIOR THIGH APPLICATION OF WOUND VACS TO LEFT WRIST WOUND AND RIGHT POSTERIOR THIGH WOUND (Left) Plan:  NPO after breakfast. Hgb 8.5.  OR today 6 PM for I and D of mid to distal thigh.   Ronald Brady 11/08/2020, 7:41 AM      

## 2020-10-27 NOTE — Progress Notes (Signed)
[facility] Progress Note  Ronald Brady 60 y.o. 1961/01/31  CC:  Rectal bleeding   Subjective: Per RN, patient had another episode of bright red blood per rectum this morning (flow sheet reports black/red).  Patient denies abdominal pain, nausea, vomiting. He reports leg pain.  ROS : Review of Systems  Cardiovascular: Negative for chest pain and palpitations.  Gastrointestinal: Positive for blood in stool and diarrhea. Negative for abdominal pain, constipation, heartburn, melena, nausea and vomiting.   Objective: Vital signs in last 24 hours: Vitals:   11/03/2020 0800 11/06/2020 0811  BP:    Pulse: (!) 109   Resp: (!) 21   Temp: 97.9 F (36.6 C)   SpO2: 98% 98%    Physical Exam:  General:  Chronically-ill appearing, alert, cooperative, no distress  Head:  Normocephalic, without obvious abnormality, atraumatic  Eyes:  Anicteric sclera, EOMs intact  Lungs:   Clear to auscultation bilaterally, respirations unlabored  Heart:  Tachycardia  Abdomen:   Soft, non-tender, non-distended, bowel sounds active all four quadrants,  no guarding or peritoneal signs  Extremities: Right thigh edema  Pulses: 2+ and symmetric    Lab Results: Recent Labs    10/26/20 0353 11/02/2020 0453  NA 141 140  K 3.8 3.3*  CL 108 107  CO2 28 26  GLUCOSE 320* 195*  BUN 25* 24*  CREATININE 0.52* 0.62  CALCIUM 6.8* 7.5*   Recent Labs    10/26/20 0353  AST 11*  ALT 7  ALKPHOS 107  BILITOT 0.7  PROT 3.6*  ALBUMIN 1.2*   Recent Labs    10/25/20 1953 10/26/20 0352 10/30/2020 0021 10/30/2020 0453  WBC 8.4   < > 9.3 8.4  NEUTROABS 6.1  --   --   --   HGB 5.9*   < > 9.1* 8.5*  HCT 19.4*   < > 26.8* 25.2*  MCV 90.7   < > 84.5 84.0  PLT 166   < > 122* 135*   < > = values in this interval not displayed.   Recent Labs    10/26/20 0227  LABPROT 16.7*  INR 1.4*      Assessment: Painless hematochezia, rectal thickening on CT-A 10/26/20, but no active bleeding.  ?rectal tube  trauma -Hgb 9.1 this morning, improved from 6.2 after 2u pRBCs -BP and HR remain at baseline despite episode of rectal bleeding this morning  Diffuse gastric wall thickening and duodenal wall thickening, suggestive of gastritis and duodenitis. Incidentally noted small bowel malrotation on CT -Mildly elevated BUN (24), stable.  Cr 0.62 -Brisk upper GI bleeding unlikely cause of anemia/hematochezia, though black/red stool was noted  Septic shock secondary of MSSA abscess of right thigh and left wrist   DM type 2   Plan: Continue supportive care.  Continue Protonix 40 mg IV BID.  Due to ongoing septic shock and abscesses, do not recommend endoscopic/colonoscopic intervention at this time.  Continue to monitor H&H with transfusion as needed to maintain Hgb >7.  [facility] will sign off. Please contact us if we can be of any further assistance during this hospital stay.  Edrick Kins PA-C 11/19/2020, 10:46 AM  Contact #  [phone]

## 2020-10-27 NOTE — Consult Note (Addendum)
WOC Nurse wound follow up Dr Ophelia Charter plans to take the patient to the OR this afternoon for further debridement and Vac dressing changes to left arm and right posterior thigh, according to progress notes. Discussed plan of care via secure chat; WOC will change negative pressure wound therapy dressings again on MON, 4/11, to begin a M/W/F schedule at that time. Cammie Mcgee MSN, RN, CWOCN, [place], CNS [phone]

## 2020-10-27 NOTE — TOC Progression Note (Signed)
Transition of Care [facility]) - Progression Note    Patient Details  Name: Ronald Brady MRN: [id number] Date of Birth: 04-18-1961  Transition of Care [facility]) CM/SW Contact  Golda Acre, RN Phone Number: 11/04/2020, 7:46 AM  Clinical Narrative:    Subjective: 4 Days Post-Op Procedure(s) (LRB): INCISION AND DRAINAGE  AND DEBRIDEMENT LEFT WRIST AND RIGHT POSTERIOR THIGH APPLICATION OF WOUND VACS TO LEFT WRIST WOUND AND RIGHT POSTERIOR THIGH WOUND (Left) Patient reports pain as moderate and severe.   Iv ancef, iv loevophed bp 75/48-116/103, PLan:following for progression and home toc needs. May need snf due to ortho surg and wound vac.  Expected Discharge Plan: Home w Home Health Services Barriers to Discharge: Continued Medical Work up  Expected Discharge Plan and Services Expected Discharge Plan: Home w Home Health Services   Discharge Planning Services: CM Consult   Living arrangements for the past 2 months: Single Family Home                                       Social Determinants of Health (SDOH) Interventions    Readmission Risk Interventions No flowsheet data found.

## 2020-10-27 NOTE — Progress Notes (Signed)
NAME:  Ronald Brady, MRN:  [id number], DOB:  01-Nov-1960, LOS: 5 ADMISSION DATE:  11/08/2020, CONSULTATION DATE:  4/3 REFERRING MD:  [facility], CHIEF COMPLAINT:  sepsis   History of Present Illness:  60 y/o M, smoker, with poorly controlled DM who presented to [facility] 4/2 after being found at home in a chair covered in urine & feces.  Seen with multiple wounds   Pertinent  Medical History  Tobacco Abuse - began smoking at 35 Worked as a logger  Poorly controlled DM - Hgb A1c 11.8 in 07/2019  HTN   Significant Hospital Events: Including procedures, antibiotic start and stop dates in addition to other pertinent events    4/02 presented to ER in shock due to multiple abscess (right thigh, left wrist), DKA. CT femur found elongated gas and fluid collection in the right hamstring c/w abscess. No evidence of osteomyelitis. Treated with clinda, meropenem, cultured, central line placed. I&D in ER.   4/03 Wrist CT with gas throughout the SQ tissues in the anterior distal forearm. Ortho consulted > taken to OR for debridement of right posterior hamstring and left forearm. Intraoperative cultures obtained. Cultures growing staph aureus (sensitivities pending). Vanco added but abx narrowed to cefazolin late pm  4/04 Ortho note reflects concern for poor wound healing, may need hip disarticulation. On levophed 1-2 mcg, BCID with staph aureus, UC 100k staph aureus.  ECHO 60-65% EF, no evidence of vegetation mentioned  4/5 CT Femur w/o contrast with multilocular fluid collection in the lower hamstrings muscles at the level of the mid to distal femur with foci of SQ emphysema  4/6 GIB overnight, Hgb down to 5.9, tx 2 units PRBC's, 1 unit FFP, PPI gtt initiated + IVF.  Follow up Hgb 7.3, remains on low dose pressors. GI consulted.   4/7 Ortho plan to repeat I&D of left wrist and right thigh   Interim History / Subjective:  Continues to report significant pain at wound site  Continues with GIB but output  less than day prior   Objective   Blood pressure 120/68, pulse (!) 109, temperature 97.9 F (36.6 C), temperature source Oral, resp. rate (!) 21, height 5\' 5"  (1.651 m), weight 75.7 kg, SpO2 98 %. CVP:  [10 mmHg-12 mmHg] 11 mmHg      Intake/Output Summary (Last 24 hours) at 10/26/2020 0940 Last data filed at 10/21/2020 12/27/2020 Gross per 24 hour  Intake 956.99 ml  Output 1850 ml  Net -893.01 ml   Filed Weights   2020-11-08 1551 11/19/2020 1510 10/24/20 0515  Weight: 68 kg 70 kg 75.7 kg    Examination: General: Chronically ill appearing elderly deconditioned middle aged male lying in bed in NAD HEENT: [place]/AT MM pink/moist, PERRL,  Neuro: Alert and oriented x3, non-focal, ROM limited by pain  CV: s1s2 regular rate and rhythm, no murmur, rubs, or gallops,  PULM:  Clear to ascultation, no increased work of breathing, no added breath sounds,  GI: soft, bowel sounds active in all 4 quadrants, BRBPR, non-tender, non-distended Extremities: warm/dry, no edema  Skin: no rashes or lesions  Labs/imaging that I have   4/2 blood cultures > Positive for Staph aureus   4/2 CT femur > Elongated gas and fluid collection noted along the surface of the hamstring muscles, in some areas extending deep into the hamstring muscles compatible with intramuscular abscess. This extends over an extensive area in length extending from the soft tissue defect in the proximal posteromedial right thigh distally to just above  the knee.  4/3 wound culture > Positive Staph aureus   4/3 CT wrist > Edema and locules of gas throughout the subcutaneous soft tissues in the anterior distal forearm. No well-defined focal abscess. This could be further evaluated with ultrasound if felt clinically indicated.  4/5 CT femur > There remains a multilocular fluid collection within the lower hamstrings muscles at the level of the mid to distal femur with foci of subcutaneous emphysema measuring 5.4 x 3.7 x 3 x 6.0 cm.  4/6 CTA ABD/Pelvis >  No evidence for active arterial GI bleeding. Diffuse gastric wall thickening and duodenal wall thickening, suggestive of gastritis and duodenitis. Incidentally noted small bowel malrotation. Small bilateral pleural effusions with adjacent atelectasis.  Resolved Hospital Problem list     Assessment & Plan:   Septic Shock secondary MSSA Abscess of R Thigh, L Wrist with MSSA Bacteremia  -TTE w/o evidence of endocarditis. Clinically improved in terms of hemodynamics 4/5 but had GIB overnight into 4/6, back on pressors.  P: ID following, appreciate assistance Culture data as above  Pressors for MAP goal > 65 Continue IV Cefazolin  Monitor urine output Repeat Images as above  Ensure adequate pain control will add a few doses of toradol and encourage more PO opoid use for longer pain control coverage   Poorly Controlled DM with Hyperglycemia  -Hyperglycemia on admit with glucose >900, small ketones in urine.  Hgb A1c 12.9 P: Continue SSI and Lantus Continue Metformin  Continue meal coverage   GIB  -BRBPR, suspect diverticular bleed  -CTA ABD/pevis with no arterial bleed  P: GI consulted, appreciate assistance  Trend CBC  Monitor for further bleeding  PPI   Hypokalemia  P: Supplement as needed  Trend Bmet   Anemia  P: Trend CBC  Transfuse per protocol  Hgb goal > 7  Active Smoker, Suspected COPD / Asthmatic Bronchitis P: Continue BDs Encourage pulmonary hygiene  Smoking cessation encouraged   Best practice   Diet:  Oral / NPO for surgery Pain/Anxiety/Delirium protocol (if indicated): No VAP protocol (if indicated): Not indicated DVT prophylaxis: SCD GI prophylaxis: PPI Glucose control:  SSI Yes Central venous access:  Yes, and it is still needed Arterial line:  N/A Foley:  Yes, and it is still needed Mobility:  bed rest  PT consulted: N/A Last date of multidisciplinary goals of care discussion n/a Code Status:  full code Disposition: ICU    CRITICAL  CARE Performed by: Delfin Gant  Total critical care time: 38 minutes  Critical care time was exclusive of separately billable procedures and treating other patients.  Critical care was necessary to treat or prevent imminent or life-threatening deterioration.  Critical care was time spent personally by me on the following activities: development of treatment plan with patient and/or surrogate as well as nursing, discussions with consultants, evaluation of patient's response to treatment, examination of patient, obtaining history from patient or surrogate, ordering and performing treatments and interventions, ordering and review of laboratory studies, ordering and review of radiographic studies, pulse oximetry and re-evaluation of patient's condition.  Delfin Gant, NP-C [facility] Personal contact information can be found on Amion  11/04/2020, 9:54 AM

## 2020-10-27 NOTE — Progress Notes (Signed)
[facility]  Date of Admission:  11/13/2020     CC: mssa bacteremia  Lines: peripheral  Abx: 4/04-c cefazolin  4/02-03 clinda, meropenem, vanc  ASSESSMENT: 60 yo male with dm admitted after found down/stuck between furniture, for severe sepsis (hypothermia) in setting abscess in RLE (posterior thigh) along with left volar wrist and distal forearm exposed tendon/soft tissue infection-abscess, and mssa bacteremia   #mssa bacteremia #severe sepsis with septic shock #soft tissue RLE and LUE abscesses 4/02 initial bcx mssa, source from abscess; s/p I&D 4/03; wound cx mssa 4/04 repeat bcx ngtd  Ct imaging LUE and RLE no osseous involvement 4/04 tte no obvious vegetation or significant regurgitation; pending tee  Critical status still prevent tee. Appears not source controlled well, pending further I&D per surgery. gib complicating course   -continue cefazolin 2 gram iv q8hours -gi managing gib -await further debridement of wound per surgery -once clinically stable would need to get tee -discussed with primary team   I spent more than 35 minute reviewing data/chart, and coordinating care and >50% direct face to face time providing counseling/discussing diagnostics/treatment plan with patient     Principal Problem:   Severe sepsis with septic shock (HCC) Active Problems:   Abscess of right thigh   Abscess of left forearm   Diabetes mellitus (HCC)   Hyperosmolar hyperglycemic state (HHS) (HCC)   AKI (acute kidney injury) (HCC)   Acute lower UTI   Emphysema lung (HCC)   Abscess of right lower extremity   Bacteremia   Pressure injury of skin   Allergies  Allergen Reactions  . Codeine Nausea And Vomiting  . Penicillins Other (See Comments)    Pt is unsure of reaction. States PCP told him he was allergic. Did it involve swelling of the face/tongue/throat, SOB, or low BP? Unknown Did it involve sudden or severe rash/hives, skin  peeling, or any reaction on the inside of your mouth or nose? Unknown Did you need to seek medical attention at a hospital or doctor's office? Unknown When did it last happen?unk If all above answers are "NO", may proceed with cephalosporin use.     Scheduled Meds: . chlorhexidine  60 mL Topical Once  . chlorhexidine  15 mL Mouth Rinse BID  . Chlorhexidine Gluconate Cloth  6 each Topical Daily  . insulin aspart  0-5 Units Subcutaneous QHS  . insulin aspart  0-9 Units Subcutaneous TID WC  . insulin aspart  4 Units Subcutaneous TID WC  . insulin glargine  10 Units Subcutaneous QHS  . ipratropium-albuterol  3 mL Nebulization QID  . ketorolac  30 mg Intravenous Q6H  . mouth rinse  15 mL Mouth Rinse q12n4p  . metFORMIN  1,000 mg Oral BID WC  . mometasone-formoterol  2 puff Inhalation BID  . nicotine  21 mg Transdermal Daily  . pantoprazole  40 mg Intravenous Q12H  . potassium chloride  20 mEq Oral Q4H  . povidone-iodine  2 application Topical Once  . sodium chloride flush  10-40 mL Intracatheter Q12H   Continuous Infusions: .  ceFAZolin (ANCEF) IV Stopped (10/25/2020 0537)  . lactated ringers Stopped (10/24/20 1425)  . norepinephrine (LEVOPHED) Adult infusion 2 mcg/min (10/29/2020 0930)   PRN Meds:.albuterol, ALPRAZolam, dextrose, melatonin, metoCLOPramide **OR** metoCLOPramide (REGLAN) injection, morphine injection, ondansetron **OR** ondansetron (ZOFRAN) IV, oxyCODONE-acetaminophen, phenol, sodium chloride flush   SUBJECTIVE: Ongoing gib needing transfusion last pm; cta abd/pelv no active arterial bleeding source. Gi following Ortho  planning retake to or for I&D of wounds No fever, n/v Thirsty  Pressor requirement on levophed down     Review of Systems: ROS All other ROS was negative, except mentioned above     OBJECTIVE: Vitals:   10/26/2020 0730 10/29/2020 0745 11/12/2020 0800 11/02/2020 0811  BP: 120/68     Pulse: (!) 101 (!) 105 (!) 109   Resp:   (!) 21   Temp:    97.9 F (36.6 C)   TempSrc:   Oral   SpO2: 98% 99% 98% 98%  Weight:      Height:       Body mass index is 27.77 kg/m.  Physical Exam  General/constitutional: no distress, pleasant, disheveled HEENT: Normocephalic, PER, Conj Clear, EOMI, Oropharynx clear Neck supple CV: rrr no mrg Lungs: clear to auscultation, normal respiratory effort Abd: Soft, Nontender Ext: no edema Skin: stable appearing right posterior thigh/left forearm wound with wound vac, and tenderness around left posterior thigh wound Neuro: nonfocal MSK: no peripheral joint swelling/tenderness/warmth; back spines nontender  Right ij site no purulence/bleeding/tenderness    Lab Results Lab Results  Component Value Date   WBC 8.4 11/08/2020   HGB 8.5 (L) 11/17/2020   HCT 25.2 (L) 11/19/2020   MCV 84.0 11/04/2020   PLT 135 (L) 11/02/2020    Lab Results  Component Value Date   CREATININE 0.62 10/26/2020   BUN 24 (H) 11/13/2020   NA 140 10/23/2020   K 3.3 (L) 11/03/2020   CL 107 11/11/2020   CO2 26 11/11/2020    Lab Results  Component Value Date   ALT 7 10/26/2020   AST 11 (L) 10/26/2020   ALKPHOS 107 10/26/2020   BILITOT 0.7 10/26/2020      Microbiology: Recent Results (from the past 240 hour(s))  Blood culture (routine x 2)     Status: Abnormal (Preliminary result)   Collection Time: 2020-10-30  4:12 PM   Specimen: Right Antecubital; Blood  Result Value Ref Range Status   Specimen Description   Final    RIGHT ANTECUBITAL BOTTLES DRAWN AEROBIC AND ANAEROBIC Performed at [facility], [address], [place], [place] [address]    Special Requests   Final    Blood Culture results may not be optimal due to an inadequate volume of blood received in culture bottles Performed at [facility], [address], [place], [place] [address]    Culture  Setup Time   Final    GRAM POSITIVE COCCI IN BOTH AEROBIC AND ANAEROBIC BOTTLES Gram Stain Report Called to,Read Back By and Verified  With: CRAWFORD,H@1015  BY MATTHEWS, B 4.3.22 CRITICAL VALUE NOTED.  VALUE IS CONSISTENT WITH PREVIOUSLY REPORTED AND CALLED VALUE. Performed at [facility], [address] [place], [place] [address]    Culture (A)  Final    STAPHYLOCOCCUS AUREUS SUSCEPTIBILITIES PERFORMED ON PREVIOUS CULTURE WITHIN THE LAST 5 DAYS. Performed at [facility], [address] [place], [place] [address]    Report Status PENDING  Incomplete  Blood culture (routine x 2)     Status: Abnormal   Collection Time: 2020-10-30  4:13 PM   Specimen: Left Antecubital; Blood  Result Value Ref Range Status   Specimen Description   Final    LEFT ANTECUBITAL BOTTLES DRAWN AEROBIC AND ANAEROBIC Performed at [facility], [address], [place], [place] [address]    Special Requests   Final    Blood Culture adequate volume Performed at [facility], [address] [place], [place] [address]  Culture  Setup Time   Final    GRAM POSITIVE COCCI AEROBIC AND ANAEROBIC BOTTLE Gram Stain Report Called to,Read Back By and Verified With: CRAWFORD,H@1015  BY MATTHEWS,B 4.3.22 Organism ID to follow CRITICAL RESULT CALLED TO, READ BACK BY AND VERIFIED WITH: S CHRISTY PHARMD @1453  11/06/2020 EB Performed at [facility], [address] [place], [place]    Culture STAPHYLOCOCCUS AUREUS (A)  Final   Report Status 10/25/2020 FINAL  Final   Organism ID, Bacteria STAPHYLOCOCCUS AUREUS  Final      Susceptibility   Staphylococcus aureus - MIC*    CIPROFLOXACIN <=0.5 SENSITIVE Sensitive     ERYTHROMYCIN <=0.25 SENSITIVE Sensitive     GENTAMICIN <=0.5 SENSITIVE Sensitive     OXACILLIN <=0.25 SENSITIVE Sensitive     TETRACYCLINE <=1 SENSITIVE Sensitive     VANCOMYCIN 1 SENSITIVE Sensitive     TRIMETH/SULFA <=10 SENSITIVE Sensitive     CLINDAMYCIN <=0.25 SENSITIVE Sensitive     RIFAMPIN <=0.5 SENSITIVE Sensitive     Inducible Clindamycin NEGATIVE Sensitive     * STAPHYLOCOCCUS AUREUS  Blood Culture ID Panel  (Reflexed)     Status: Abnormal   Collection Time: 10/28/2020  4:13 PM  Result Value Ref Range Status   Enterococcus faecalis NOT DETECTED NOT DETECTED Final   Enterococcus Faecium NOT DETECTED NOT DETECTED Final   Listeria monocytogenes NOT DETECTED NOT DETECTED Final   Staphylococcus species DETECTED (A) NOT DETECTED Final    Comment: CRITICAL RESULT CALLED TO, READ BACK BY AND VERIFIED WITH: S CHRISTY PHARMD @1453  10/29/2020 EB    Staphylococcus aureus (BCID) DETECTED (A) NOT DETECTED Final    Comment: CRITICAL RESULT CALLED TO, READ BACK BY AND VERIFIED WITH: S CHRISTY PHARMD @1453  11/08/2020 EB    Staphylococcus epidermidis NOT DETECTED NOT DETECTED Final   Staphylococcus lugdunensis NOT DETECTED NOT DETECTED Final   Streptococcus species NOT DETECTED NOT DETECTED Final   Streptococcus agalactiae NOT DETECTED NOT DETECTED Final   Streptococcus pneumoniae NOT DETECTED NOT DETECTED Final   Streptococcus pyogenes NOT DETECTED NOT DETECTED Final   A.calcoaceticus-baumannii NOT DETECTED NOT DETECTED Final   Bacteroides fragilis NOT DETECTED NOT DETECTED Final   Enterobacterales NOT DETECTED NOT DETECTED Final   Enterobacter cloacae complex NOT DETECTED NOT DETECTED Final   Escherichia coli NOT DETECTED NOT DETECTED Final   Klebsiella aerogenes NOT DETECTED NOT DETECTED Final   Klebsiella oxytoca NOT DETECTED NOT DETECTED Final   Klebsiella pneumoniae NOT DETECTED NOT DETECTED Final   Proteus species NOT DETECTED NOT DETECTED Final   Salmonella species NOT DETECTED NOT DETECTED Final   Serratia marcescens NOT DETECTED NOT DETECTED Final   Haemophilus influenzae NOT DETECTED NOT DETECTED Final   Neisseria meningitidis NOT DETECTED NOT DETECTED Final   Pseudomonas aeruginosa NOT DETECTED NOT DETECTED Final   Stenotrophomonas maltophilia NOT DETECTED NOT DETECTED Final   Candida albicans NOT DETECTED NOT DETECTED Final   Candida auris NOT DETECTED NOT DETECTED Final   Candida glabrata NOT  DETECTED NOT DETECTED Final   Candida krusei NOT DETECTED NOT DETECTED Final   Candida parapsilosis NOT DETECTED NOT DETECTED Final   Candida tropicalis NOT DETECTED NOT DETECTED Final   Cryptococcus neoformans/gattii NOT DETECTED NOT DETECTED Final   Meth resistant mecA/C and MREJ NOT DETECTED NOT DETECTED Final    Comment: Performed at [facility], [address] [place], [facility] [address]  Resp Panel by RT-PCR (Flu A&B, Covid) Nasopharyngeal Swab     Status: None  Collection Time: 11/04/2020  6:16 PM   Specimen: Nasopharyngeal Swab; Nasopharyngeal(NP) swabs in vial transport medium  Result Value Ref Range Status   SARS Coronavirus 2 by RT PCR NEGATIVE NEGATIVE Final    Comment: (NOTE) SARS-CoV-2 target nucleic acids are NOT DETECTED.  The SARS-CoV-2 RNA is generally detectable in upper respiratory specimens during the acute phase of infection. The lowest concentration of SARS-CoV-2 viral copies this assay can detect is 138 copies/mL. A negative result does not preclude SARS-Cov-2 infection and should not be used as the sole basis for treatment or other patient management decisions. A negative result may occur with  improper specimen collection/handling, submission of specimen other than nasopharyngeal swab, presence of viral mutation(s) within the areas targeted by this assay, and inadequate number of viral copies(<138 copies/mL). A negative result must be combined with clinical observations, patient history, and epidemiological information. The expected result is Negative.  Fact Sheet for Patients:  BloggerCourse.comhttps://www.fda.gov/media/152166/download  Fact Sheet for Healthcare Providers:  SeriousBroker.ithttps://www.fda.gov/media/152162/download  This test is no t yet approved or cleared by the [place] FDA and  has been authorized for detection and/or diagnosis of SARS-CoV-2 by FDA under an Emergency Use Authorization (EUA). This EUA will remain  in effect (meaning this test can be used)  for the duration of the COVID-19 declaration under Section 564(b)(1) of the Act, 21 U.S.C.section 360bbb-3(b)(1), unless the authorization is terminated  or revoked sooner.       Influenza A by PCR NEGATIVE NEGATIVE Final   Influenza B by PCR NEGATIVE NEGATIVE Final    Comment: (NOTE) The Xpert Xpress SARS-CoV-2/FLU/RSV plus assay is intended as an aid in the diagnosis of influenza from Nasopharyngeal swab specimens and should not be used as a sole basis for treatment. Nasal washings and aspirates are unacceptable for Xpert Xpress SARS-CoV-2/FLU/RSV testing.  Fact Sheet for Patients: BloggerCourse.comhttps://www.fda.gov/media/152166/download  Fact Sheet for Healthcare Providers: SeriousBroker.ithttps://www.fda.gov/media/152162/download  This test is not yet approved or cleared by the [place] FDA and has been authorized for detection and/or diagnosis of SARS-CoV-2 by FDA under an Emergency Use Authorization (EUA). This EUA will remain in effect (meaning this test can be used) for the duration of the COVID-19 declaration under Section 564(b)(1) of the Act, 21 U.S.C. section 360bbb-3(b)(1), unless the authorization is terminated or revoked.  Performed at [facility], [address] [place], [place] [address]   Culture, Urine     Status: Abnormal   Collection Time: 11/04/2020 10:36 PM   Specimen: Urine, Clean Catch  Result Value Ref Range Status   Specimen Description   Final    URINE, CLEAN CATCH Performed at [facility], [address] [place], [place] [address]    Special Requests   Final    NONE Performed at [facility], [address] [place], [place] [address]    Culture >=100,000 COLONIES/mL STAPHYLOCOCCUS AUREUS (A)  Final   Report Status 10/26/2020 FINAL  Final   Organism ID, Bacteria STAPHYLOCOCCUS AUREUS (A)  Final      Susceptibility   Staphylococcus aureus - MIC*    CIPROFLOXACIN <=0.5 SENSITIVE Sensitive     GENTAMICIN <=0.5 SENSITIVE Sensitive     NITROFURANTOIN <=16 SENSITIVE  Sensitive     OXACILLIN <=0.25 SENSITIVE Sensitive     TETRACYCLINE <=1 SENSITIVE Sensitive     VANCOMYCIN <=0.5 SENSITIVE Sensitive     TRIMETH/SULFA <=10 SENSITIVE Sensitive     CLINDAMYCIN <=0.25 SENSITIVE Sensitive     RIFAMPIN <=0.5 SENSITIVE Sensitive     Inducible Clindamycin  NEGATIVE Sensitive     * >=100,000 COLONIES/mL STAPHYLOCOCCUS AUREUS  MRSA PCR Screening     Status: None   Collection Time: 11/16/2020  3:13 PM   Specimen: Nasopharyngeal  Result Value Ref Range Status   MRSA by PCR NEGATIVE NEGATIVE Final    Comment:        The GeneXpert MRSA Assay (FDA approved for NASAL specimens only), is one component of a comprehensive MRSA colonization surveillance program. It is not intended to diagnose MRSA infection nor to guide or monitor treatment for MRSA infections. Performed at [facility], [address] [place], [place] [address]   Aerobic/Anaerobic Culture w Gram Stain (surgical/deep wound)     Status: None (Preliminary result)   Collection Time: 10/24/2020  7:58 PM   Specimen: Wound  Result Value Ref Range Status   Specimen Description   Final    WOUND Performed at [facility], [address], [place], [place] [address]    Special Requests RIGHT HAMSTRING PATIENT ON FOLLOWING MERREM  Final   Gram Stain   Final    FEW WBC PRESENT,BOTH PMN AND MONONUCLEAR ABUNDANT GRAM POSITIVE COCCI IN PAIRS IN CLUSTERS    Culture   Final    ABUNDANT STAPHYLOCOCCUS AUREUS CULTURE REINCUBATED FOR BETTER GROWTH Performed at [facility], [address], [place], [place] [address]    Report Status PENDING  Incomplete   Organism ID, Bacteria STAPHYLOCOCCUS AUREUS  Final      Susceptibility   Staphylococcus aureus - MIC*    CIPROFLOXACIN <=0.5 SENSITIVE Sensitive     ERYTHROMYCIN <=0.25 SENSITIVE Sensitive     GENTAMICIN <=0.5 SENSITIVE Sensitive     OXACILLIN <=0.25 SENSITIVE Sensitive     TETRACYCLINE <=1 SENSITIVE Sensitive      VANCOMYCIN <=0.5 SENSITIVE Sensitive     TRIMETH/SULFA <=10 SENSITIVE Sensitive     CLINDAMYCIN <=0.25 SENSITIVE Sensitive     RIFAMPIN <=0.5 SENSITIVE Sensitive     Inducible Clindamycin NEGATIVE Sensitive     * ABUNDANT STAPHYLOCOCCUS AUREUS  Culture, blood (routine x 2)     Status: None (Preliminary result)   Collection Time: 10/24/20 11:48 AM   Specimen: BLOOD RIGHT HAND  Result Value Ref Range Status   Specimen Description   Final    BLOOD RIGHT HAND Performed at [facility], [address], [place], [place] [address]    Special Requests   Final    BOTTLES DRAWN AEROBIC AND ANAEROBIC Blood Culture adequate volume Performed at [facility], [address] [place], [place] [address]    Culture   Final    NO GROWTH 2 DAYS Performed at [facility], [address] [place], [place] [address]    Report Status PENDING  Incomplete  Culture, blood (routine x 2)     Status: None (Preliminary result)   Collection Time: 10/24/20 11:53 AM   Specimen: BLOOD RIGHT HAND  Result Value Ref Range Status   Specimen Description   Final    BLOOD RIGHT HAND Performed at [facility], [address], [place], [place] [address]    Special Requests   Final    BOTTLES DRAWN AEROBIC AND ANAEROBIC Blood Culture adequate volume Performed at [facility], [address], [place], [place] [address]    Culture   Final    NO GROWTH 2 DAYS Performed at [facility], [address], [place], [place] [address]  Report Status PENDING  Incomplete     Serology:   Imaging: If present, new imagings (plain films, ct scans, and mri) have been personally visualized and interpreted; radiology reports have been reviewed. Decision making incorporated into the Impression / Recommendations.  4/02 ct femur right Elongated gas and fluid collection noted along the surface of the hamstring muscles, in some areas extending  deep into the hamstring muscles compatible with intramuscular abscess. This extends over an extensive area in length extending from the soft tissue defect in the proximal posteromedial right thigh distally to just above the knee.  No evidence of osteomyelitis.   4/03 ct left forearm Edema and locules of gas throughout the subcutaneous soft tissues in the anterior distal forearm. No well-defined focal abscess. This could be further evaluated with ultrasound if felt clinically Indicated.   4/06 abd pelv cta 1. No evidence for active arterial GI bleeding. 2. Diffuse gastric wall thickening and duodenal wall thickening, suggestive of gastritis and duodenitis. Incidentally noted small bowel malrotation. 3. Small bilateral pleural effusions with adjacent atelectasis. 4. Anasarca. 5. Distended urinary bladder.  Raymondo Band, MD [facility] [phone] pager    31-Oct-2020, 10:34 AM

## 2020-10-27 NOTE — Anesthesia Procedure Notes (Signed)
Date/Time: 11/16/2020 7:41 PM Performed by: Minerva Ends, CRNA Oxygen Delivery Method: Simple face mask Placement Confirmation: positive ETCO2 and breath sounds checked- equal and bilateral Dental Injury: Teeth and Oropharynx as per pre-operative assessment

## 2020-10-27 NOTE — Progress Notes (Signed)
OT Cancellation Note  Patient Details Name: XADEN KAUFMAN MRN: [id number] DOB: [birth year]   Cancelled Treatment:    Reason Eval/Treat Not Completed: Patient declined, no reason specified. Per RN patient refuses therapy today due to uncontrolled pain. Patient scheduled for another I & D today. Will f/u as able.   Chou Busler L Colbey Wirtanen 11/09/2020, 9:32 AM

## 2020-10-28 ENCOUNTER — Encounter (HOSPITAL_COMMUNITY): Payer: Self-pay | Admitting: Orthopaedic Surgery

## 2020-10-28 ENCOUNTER — Inpatient Hospital Stay: Payer: Self-pay

## 2020-10-28 DIAGNOSIS — E440 Moderate protein-calorie malnutrition: Secondary | ICD-10-CM | POA: Insufficient documentation

## 2020-10-28 DIAGNOSIS — L02415 Cutaneous abscess of right lower limb: Secondary | ICD-10-CM | POA: Diagnosis not present

## 2020-10-28 DIAGNOSIS — R6521 Severe sepsis with septic shock: Secondary | ICD-10-CM | POA: Diagnosis not present

## 2020-10-28 DIAGNOSIS — A4101 Sepsis due to Methicillin susceptible Staphylococcus aureus: Secondary | ICD-10-CM | POA: Diagnosis not present

## 2020-10-28 DIAGNOSIS — A419 Sepsis, unspecified organism: Secondary | ICD-10-CM | POA: Diagnosis not present

## 2020-10-28 DIAGNOSIS — L02414 Cutaneous abscess of left upper limb: Secondary | ICD-10-CM | POA: Diagnosis not present

## 2020-10-28 LAB — GLUCOSE, CAPILLARY
Glucose-Capillary: 234 mg/dL — ABNORMAL HIGH (ref 70–99)
Glucose-Capillary: 262 mg/dL — ABNORMAL HIGH (ref 70–99)
Glucose-Capillary: 327 mg/dL — ABNORMAL HIGH (ref 70–99)
Glucose-Capillary: 160 mg/dL — ABNORMAL HIGH (ref 70–99)
Glucose-Capillary: 96 mg/dL (ref 70–99)

## 2020-10-28 LAB — TYPE AND SCREEN
ABO/RH(D): O POS
Antibody Screen: NEGATIVE
Unit division: 0
Unit division: 0
Unit division: 0
Unit division: 0
Unit division: 0
Unit division: 0
Unit division: 0
Unit division: 0

## 2020-10-28 LAB — BPAM RBC
Blood Product Expiration Date: 202204272359
Blood Product Expiration Date: 202204292359
Blood Product Expiration Date: 202204292359
Blood Product Expiration Date: 202205012359
Blood Product Expiration Date: 202205022359
Blood Product Expiration Date: 202205022359
Blood Product Expiration Date: 202205032359
Blood Product Expiration Date: 202205082359
ISSUE DATE / TIME: 202204041352
ISSUE DATE / TIME: 202204052227
ISSUE DATE / TIME: 202204060008
ISSUE DATE / TIME: 202204061319
ISSUE DATE / TIME: 202204061557
ISSUE DATE / TIME: 202204071915
Unit Type and Rh: 5100
Unit Type and Rh: 5100
Unit Type and Rh: 5100
Unit Type and Rh: 5100
Unit Type and Rh: 5100
Unit Type and Rh: 5100
Unit Type and Rh: 5100
Unit Type and Rh: 5100

## 2020-10-28 LAB — CBC
HCT: 25.7 % — ABNORMAL LOW (ref 39.0–52.0)
Hemoglobin: 8.7 g/dL — ABNORMAL LOW (ref 13.0–17.0)
MCH: 29 pg (ref 26.0–34.0)
MCHC: 33.9 g/dL (ref 30.0–36.0)
MCV: 85.7 fL (ref 80.0–100.0)
Platelets: 125 10*3/uL — ABNORMAL LOW (ref 150–400)
RBC: 3 MIL/uL — ABNORMAL LOW (ref 4.22–5.81)
RDW: 15.9 % — ABNORMAL HIGH (ref 11.5–15.5)
WBC: 7.3 10*3/uL (ref 4.0–10.5)
nRBC: 0 % (ref 0.0–0.2)

## 2020-10-28 LAB — BASIC METABOLIC PANEL
Anion gap: 7 (ref 5–15)
BUN: 18 mg/dL (ref 6–20)
CO2: 24 mmol/L (ref 22–32)
Calcium: 7.1 mg/dL — ABNORMAL LOW (ref 8.9–10.3)
Chloride: 105 mmol/L (ref 98–111)
Creatinine, Ser: 0.66 mg/dL (ref 0.61–1.24)
GFR, Estimated: >60 mL/min (ref >60)
Glucose, Bld: 223 mg/dL — ABNORMAL HIGH (ref 70–99)
Potassium: 3.9 mmol/L (ref 3.5–5.1)
Sodium: 136 mmol/L (ref 135–145)

## 2020-10-28 MED ORDER — MIDODRINE HCL 5 MG PO TABS
10.0000 mg | ORAL_TABLET | Freq: Three times a day (TID) | ORAL | Status: DC
  Administered 2020-10-28 – 2020-10-31 (×10): 10 mg via ORAL
  Filled 2020-10-28 (×12): qty 2

## 2020-10-28 MED ORDER — ADULT MULTIVITAMIN W/MINERALS CH
1.0000 | ORAL_TABLET | Freq: Every day | ORAL | Status: DC
  Administered 2020-10-28 – 2020-10-31 (×3): 1 via ORAL
  Filled 2020-10-28 (×4): qty 1

## 2020-10-28 MED ORDER — PROSOURCE PLUS PO LIQD
30.0000 mL | Freq: Two times a day (BID) | ORAL | Status: DC
  Administered 2020-10-28 – 2020-10-29 (×4): 30 mL via ORAL
  Filled 2020-10-28 (×5): qty 30

## 2020-10-28 MED ORDER — GLUCERNA SHAKE PO LIQD
237.0000 mL | Freq: Two times a day (BID) | ORAL | Status: DC
  Administered 2020-10-28 – 2020-10-29 (×2): 237 mL via ORAL
  Filled 2020-10-28 (×10): qty 237

## 2020-10-28 MED ORDER — CHLORHEXIDINE GLUCONATE 0.12 % MT SOLN
OROMUCOSAL | Status: AC
  Filled 2020-10-28: qty 15

## 2020-10-28 MED ORDER — FUROSEMIDE 10 MG/ML IJ SOLN
40.0000 mg | Freq: Once | INTRAMUSCULAR | Status: AC
  Administered 2020-10-28: 40 mg via INTRAVENOUS
  Filled 2020-10-28: qty 4

## 2020-10-28 NOTE — Anesthesia Postprocedure Evaluation (Signed)
Anesthesia Post Note  Patient: Vidyuth Belsito [facility]  Procedure(s) Performed: IRRIGATION AND DEBRIDEMENT DISTAL THIGH WITH WOUND VAC REMOVAL & APPLICATION (Right Arm Lower) REAPPLICATION OF LEFT WRIST DRESSING, PACKED (Left Wrist)     Patient location during evaluation: PACU Anesthesia Type: General Level of consciousness: awake and alert Pain management: pain level controlled Vital Signs Assessment: post-procedure vital signs reviewed and stable Respiratory status: spontaneous breathing, nonlabored ventilation, respiratory function stable and patient connected to nasal cannula oxygen Cardiovascular status: Pt remains on levo gtt.  Postop Assessment: no apparent nausea or vomiting Anesthetic complications: no Comments: Transfusing PRBC x 2 units.  Transfer back to ICU.   No complications documented.  Last Vitals:  Vitals:   10/21/2020 2330 10/23/2020 2341  BP:    Pulse: 100 (!) 110  Resp: 20 (!) 27  Temp:    SpO2: 100% 100%    Last Pain:  Vitals:   11/04/2020 2200  TempSrc:   PainSc: 0-No pain                 Nayab Aten S

## 2020-10-28 NOTE — Progress Notes (Addendum)
NAME:  Ronald Brady, MRN:  [id number], DOB:  09-Apr-1961, LOS: 6 ADMISSION DATE:  11/20/20, CONSULTATION DATE:  4/3 REFERRING MD:  [facility], CHIEF COMPLAINT:  sepsis   History of Present Illness:  60 y/o M, smoker, with poorly controlled DM who presented to [facility] 4/2 after being found at home in a chair covered in urine & feces.  Seen with multiple wounds   Pertinent  Medical History  Tobacco Abuse - began smoking at 66 Worked as a logger  Poorly controlled DM - Hgb A1c 11.8 in 07/2019  HTN   Significant Hospital Events: Including procedures, antibiotic start and stop dates in addition to other pertinent events    4/02 presented to ER in shock due to multiple abscess (right thigh, left wrist), DKA. CT femur found elongated gas and fluid collection in the right hamstring c/w abscess. No evidence of osteomyelitis. Treated with clinda, meropenem, cultured, central line placed. I&D in ER.   4/03 Wrist CT with gas throughout the SQ tissues in the anterior distal forearm. Ortho consulted > taken to OR for debridement of right posterior hamstring and left forearm. Intraoperative cultures obtained. Cultures growing staph aureus (sensitivities pending). Vanco added but abx narrowed to cefazolin late pm  4/04 Ortho note reflects concern for poor wound healing, may need hip disarticulation. On levophed 1-2 mcg, BCID with staph aureus, UC 100k staph aureus.  ECHO 60-65% EF, no evidence of vegetation mentioned  4/5 CT Femur w/o contrast with multilocular fluid collection in the lower hamstrings muscles at the level of the mid to distal femur with foci of SQ emphysema  4/6 GIB overnight, Hgb down to 5.9, tx 2 units PRBC's, 1 unit FFP, PPI gtt initiated + IVF.  Follow up Hgb 7.3, remains on low dose pressors. GI consulted.   4/8 Repeat I&D of left wrist and right thigh, extensive purulent material with new abscess seen near pelvis   Interim History / Subjective:  No acute issues overnight   Tolerated repeat I&D of hip and wrist well  Requesting breakfast   Objective   Blood pressure (!) 87/56, pulse (!) 112, temperature 99 F (37.2 C), temperature source Oral, resp. rate 19, height 5\' 5"  (1.651 m), weight 75.7 kg, SpO2 97 %. CVP:  [7 mmHg-51 mmHg] 9 mmHg      Intake/Output Summary (Last 24 hours) at 10/28/2020 0911 Last data filed at 10/28/2020 12/28/2020 Gross per 24 hour  Intake 3170.62 ml  Output 1800 ml  Net 1370.62 ml   Filed Weights   11/17/2020 1510 10/24/20 0515 11/08/2020 1740  Weight: 70 kg 75.7 kg 75.7 kg    Examination: General: Chronically ill appearing deconditioned middle aged male sitting up in bed in NAD HEENT: ETT, MM pink/moist, PERRL,  Neuro: Alert and oriented x3, ROM limited in right LE due to hamstring contracture and wound  CV: s1s2 regular rate and rhythm, no murmur, rubs, or gallops,  PULM:  Clear to ascultation, no added breath sounds, no increased work of breathing GI: soft, bowel sounds active in all 4 quadrants, non-tender, non-distended, tolerating diet, no further signs of GIB Extremities: warm/dry, no edema  Skin: no rashes or lesions  Labs/imaging that I have   4/2 blood cultures > Positive for Staph aureus   4/2 CT femur > Elongated gas and fluid collection noted along the surface of the hamstring muscles, in some areas extending deep into the hamstring muscles compatible with intramuscular abscess. This extends over an extensive area in length extending  from the soft tissue defect in the proximal posteromedial right thigh distally to just above the knee.  4/3 wound culture > Positive Staph aureus   4/3 CT wrist > Edema and locules of gas throughout the subcutaneous soft tissues in the anterior distal forearm. No well-defined focal abscess. This could be further evaluated with ultrasound if felt clinically indicated.  4/5 CT femur > There remains a multilocular fluid collection within the lower hamstrings muscles at the level of the mid to  distal femur with foci of subcutaneous emphysema measuring 5.4 x 3.7 x 3 x 6.0 cm.  4/6 CTA ABD/Pelvis > No evidence for active arterial GI bleeding. Diffuse gastric wall thickening and duodenal wall thickening, suggestive of gastritis and duodenitis. Incidentally noted small bowel malrotation. Small bilateral pleural effusions with adjacent atelectasis.  Resolved Hospital Problem list     Assessment & Plan:   Septic Shock secondary MSSA Abscess of R Thigh, L Wrist with MSSA Bacteremia  -TTE w/o evidence of endocarditis. Clinically improved in terms of hemodynamics 4/5 but had GIB overnight into 4/6, back on pressors.  -Repeat I&D of left wrist and right thigh 4/7 extensive purulent material with new abscess seen near pelvis  P: ID and Ortho following, appreciate assistance Culture data as above Remains on low dose Levo  MAP goal > 65 Continue IV Cefazolin Pain control  Continue wound vacs   Poorly Controlled DM with Hyperglycemia  -Hyperglycemia on admit with glucose >900, small ketones in urine.  Hgb A1c 12.9 P: Continue SSI, Lantus, and meal coverage  Continue home metformin   GIB, improved  -BRBPR, suspect diverticular bleed  -CTA ABD/pevis with no arterial bleed  P: GI assess and have now signed off as of 4/7 Conservative measures, due to critical illness Supportive care  Hgb stable at 8.7 PPI  Hypokalemia, improved  P: Trend Bmet  Supplement as needed   Anemia  P: Trend CBC  Transfuse per protocol Hgb goal greater than 7  Active Smoker, Suspected COPD / Asthmatic Bronchitis P: Continue BDs Encourage pulmonary hygiene  Smoking cessation encouraged   Best practice   Diet:  Oral / NPO for surgery Pain/Anxiety/Delirium protocol (if indicated): No VAP protocol (if indicated): Not indicated DVT prophylaxis: SCD GI prophylaxis: PPI Glucose control:  SSI Yes Central venous access:  Yes, and it is still needed Arterial line:  N/A Foley:  Yes, and it is  still needed Mobility:  bed rest  PT consulted: N/A Last date of multidisciplinary goals of care discussion n/a Code Status:  full code Disposition: ICU    CRITICAL CARE Performed by: Delfin Gant  Total critical care time: 37 minutes  Critical care time was exclusive of separately billable procedures and treating other patients.  Critical care was necessary to treat or prevent imminent or life-threatening deterioration.  Critical care was time spent personally by me on the following activities: development of treatment plan with patient and/or surrogate as well as nursing, discussions with consultants, evaluation of patient's response to treatment, examination of patient, obtaining history from patient or surrogate, ordering and performing treatments and interventions, ordering and review of laboratory studies, ordering and review of radiographic studies, pulse oximetry and re-evaluation of patient's condition.  Delfin Gant, NP-C [facility] Personal contact information can be found on Amion  10/28/2020, 9:11 AM   Patient seen, independently examined, care plan was formulated and discussed with Raymon Mutton as per documentation   Septic shock secondary to MSSA abscess of  right eye, left wrist with MSSA bacteremia -No evidence of endocarditis on TTE -Continues on antibiotics-cefazolin  Poorly controlled diabetes -On SSI and Lantus  GI bleed -Stable -Transfuse per protocol -Continue PPI  Electrolyte derangement -Being repleted  Obstructive lung disease -Continue bronchodilators  Virl Diamond, MD [facility] Pager: [phone]

## 2020-10-28 NOTE — Progress Notes (Signed)
Physical Therapy Treatment Patient Details Name: Ronald Brady MRN: [id number] DOB: 03/13/1961 Today's Date: 10/28/2020    History of Present Illness 60 y.o. male presenting with to ED after being found by his sister sitting in a chair x4 days covered in excrement. Patient found to have abscess on R proximal posterior upper leg s/p 2x I&D and wound vac placement. Patient also found to have L proximal wrist wound and wound on ulnar aspect of wrist. Patient reports R hamstring injury x several weeks. Patient admitted with severe septic shock 2/2 fascciitis, AKI, hyperosmolar hyperglycemic state and UTI. PMHx significant for uncontrolled DMII, emphysema, HTN, and tobacco use.    PT Comments    Patient reports feeling a little bit better than the last couple day but expresses concerns about mobilizing and disrupting wound site/vac on Rt LE. Educated on importance of progressing mobility and pt agreeable to EOB. RN assisting with lines. Mod assist with cues for safety and sequencing to move to Lt sidelying and press up to sit. He requires assist for Rt LE and is limited by pain and flexion contracture at Rt knee. Pt require min assist/guard to sit EOB and scoot forward. Pt became increasingly anxious at EOB and more impulsive almost attempting to stand. Deep breathing techniques used to anxiety and cues to return to supine with 2+ mod assist and to control trunk/LEs and manage lines. Bed linens changed by nursing staff during mobility. Patient calm at  EOS and hopes to use RW next session to attempt standing. Acute PT will continue to progress pt as able, continue to recommend SNF follow up.   Follow Up Recommendations  SNF     Equipment Recommendations  Wheelchair cushion (measurements PT);Wheelchair (measurements PT)    Recommendations for Other Services       Precautions / Restrictions Precautions Precautions: Fall Precaution Comments: wound vac right leg and left arm, does not want right  leg touched; CVP, A-line Rt wrist Restrictions Weight Bearing Restrictions: No    Mobility  Bed Mobility Overal bed mobility: Needs Assistance Bed Mobility: Rolling;Sidelying to Sit;Sit to Supine Rolling: +2 for safety/equipment;Mod assist Sidelying to sit: Min assist;+2 for safety/equipment;HOB elevated   Sit to supine: HOB elevated;+2 for safety/equipment;+2 for physical assistance;Mod assist   General bed mobility comments: pt required cues for safe sequencing with rolling, pt resistant to assist for Rt LE at first but then allowing due to pain. Pt able to use Rt hand and Lt elbow to press trunk up with Mod assist from therapist. Once EOB pt required min guard to sit fro safety and attempted scooting to place Lt foot on floor, mod assist needed to complete. pt became impatient and impulsive at EOB stating "you want me to get up" and shifting forward. Cue for safety and for deep breathing as pt became anxious. Mod +2 assist to return to supine safely with nursing assisting with lines. VSS throughout.    Transfers                    Ambulation/Gait                 Stairs             Wheelchair Mobility    Modified Rankin (Stroke Patients Only)       Balance  Cognition Arousal/Alertness: Awake/alert Behavior During Therapy: Anxious;Impulsive Overall Cognitive Status: Impaired/Different from baseline Area of Impairment: Following commands;Safety/judgement;Awareness;Problem solving                       Following Commands: Follows one step commands with increased time;Follows multi-step commands with increased time Safety/Judgement: Decreased awareness of safety;Decreased awareness of deficits Awareness: Emergent Problem Solving: Requires verbal cues General Comments: Pt A&O to self, place, and situation. pt agreeable to sit EOB for activity. pt became increasingly anxious as he  mobilized and protective of Rt LE despite requiring some assist to mobilize. pt with decreased awareness of safety related to lines/drains and became impatient and impulsive at EOB wanting to know what is next.      Exercises      General Comments        Pertinent Vitals/Pain Pain Assessment: Faces Faces Pain Scale: Hurts even more Pain Location: right posterior thigh with movement Pain Descriptors / Indicators: Grimacing;Guarding Pain Intervention(s): Limited activity within patient's tolerance;Monitored during session;Premedicated before session;Repositioned (pt very anxious and nervous to mobilize)    Home Living                      Prior Function            PT Goals (current goals can now be found in the care plan section) Acute Rehab PT Goals Patient Stated Goal: To decrease pain. PT Goal Formulation: With patient Time For Goal Achievement: 11/08/20 Potential to Achieve Goals: Fair Progress towards PT goals: Progressing toward goals    Frequency    Min 2X/week      PT Plan Current plan remains appropriate    Co-evaluation              AM-PAC PT "6 Clicks" Mobility   Outcome Measure  Help needed turning from your back to your side while in a flat bed without using bedrails?: A Lot Help needed moving from lying on your back to sitting on the side of a flat bed without using bedrails?: A Lot Help needed moving to and from a bed to a chair (including a wheelchair)?: Total Help needed standing up from a chair using your arms (e.g., wheelchair or bedside chair)?: Total Help needed to walk in hospital room?: Total Help needed climbing 3-5 steps with a railing? : Total 6 Click Score: 8    End of Session   Activity Tolerance: Patient limited by pain;Patient tolerated treatment well (pain and anxiety limiting) Patient left: in bed;with call bell/phone within reach;with nursing/sitter in room Nurse Communication: Mobility status PT Visit Diagnosis:  Unsteadiness on feet (R26.81);Pain;Difficulty in walking, not elsewhere classified (R26.2) Pain - Right/Left: Right Pain - part of body: Leg     Time: 1205-1225 PT Time Calculation (min) (ACUTE ONLY): 20 min  Charges:  $Therapeutic Activity: 8-22 mins                     Wynn Maudlin, DPT Acute Rehabilitation Services Office [phone] Pager [phone]     Anitra Lauth 10/28/2020, 1:10 PM

## 2020-10-28 NOTE — Progress Notes (Addendum)
TX session completed for dysphagia management. [id number] Pt reports swallow is near his baseline ability.  RN reports pt continuing to have some coughing with intake - both during and after eating.  Pt's voice is strong and not gurgly during session.  Observed with liquid intake = intermittent throat clearing noted. Pt independently admits to some coughing with intake prior to admission - but with current weakness/deconditioning follow up briefly for dysphagia management indicated due to his increased risk of aspiration pna.  Provided pt with tissues and basin to help with clearance of secretions.  Using teach back and written precautions pt educated to importance of coughing and expectorating if sense need and upright posture with slow rate of po.  Full report to follow.  Ronald Infante, MS [facility] SLP Acute Rehab Services Office [phone] Pager [phone]

## 2020-10-28 NOTE — Progress Notes (Signed)
Initial Nutrition Assessment  DOCUMENTATION CODES:   Non-severe (moderate) malnutrition in context of chronic illness  INTERVENTION:  - will order Glucerna Shake BID, each supplement provides 220 kcal and 10 grams of protein. - will order 30 ml Prosource Plus BID, each supplement provides 100 kcal and 15 grams protein.  - will order 1 tablet multivitamin with minerals/day.    NUTRITION DIAGNOSIS:   Moderate Malnutrition related to chronic illness (uncontrolled DM) as evidenced by mild fat depletion,mild muscle depletion.  GOAL:   Patient will meet greater than or equal to 90% of their needs  MONITOR:   PO intake,Supplement acceptance,Labs,Weight trends  REASON FOR ASSESSMENT:   Consult Assessment of nutrition requirement/status  ASSESSMENT:   60 y.o. male with medical history of DM, HTN, and emphysema. He presented to the ED via EMS after being found by sister and was noted to have been in the same chair x4 days with inability to get up. He reported to ED staff that he had torn his hamstring which prevented him from getting up and that injury occurred 3 weeks PTA. He did not eat or take his insulin in the 4 days PTA.  Patient discussed in rounds this AM.   He has been NPO multiple times throughout hospitalization. SLP evaluated patient on 4/5 at which time it was recommended that he be on a Dysphagia 3, thin liquids diet.   Patient is POD #5 I&D R thigh abscess with excisional debridement of skin, subcutaneous tissue, muscle, and tendon and wound vac placement; I&D L forearm subcutaneous abscess with excisional debridement of skin, subcutaneous tissue, and tendon and wound vac placement.   Patient POD #1 I&D of subtendons tissue and necrotic hamstring muscle and tendon; I&D of thigh abscess with wound vac placement.   Possible need to return to OR next week.   Patient laying in bed with no family or visitors present. He reports living alone and that he does all of the  cooking. He likes to make casseroles and eats 1-2 times/day.   He reports being dx with DM in his 73s and that he checks CBGs TID (in the AM, in the evening, at night). It is not uncommon for CBGs to be >400 mg/dl. He reports working with outpatient MD to better control CBGs. He has been experiencing lethargy and weakness which he attributes to hyperglycemia. He denies every missing doses of insulin prior to the few days PTA.   Weight yesterday was 167 lb and weight on 08/22/19 was 164 lb.   He enjoys Ensure supplements and was consuming these at home but does not like Ensure Enlive or Ensure Plus. Patient is agreeable to Glucerna Shakes.   He denies any chewing or swallowing issues, but RN reports that patient has had some intermittent coughing which is concerning. Patient reports that this is normal for him.    Labs reviewed; CBGs: 234, 262, 327 mg/dl, Ca: 7.1 mg/dl.  Medications reviewed; 40 mg IV lasix x1 dose 4/8, sliding scale novolog, 4 units novolog TID, 10 units lantus/day, 1000 mg metformin BID, 40 mg IV protonix BID. IVF; LR @ 50 ml/hr.     NUTRITION - FOCUSED PHYSICAL EXAM:  Flowsheet Row Most Recent Value  Orbital Region Mild depletion  Upper Arm Region No depletion  Thoracic and Lumbar Region Unable to assess  Buccal Region Mild depletion  Temple Region Moderate depletion  Clavicle Bone Region Mild depletion  Clavicle and Acromion Bone Region No depletion  Scapular Bone Region Unable to assess  Dorsal Hand Mild depletion  Patellar Region No depletion  Anterior Thigh Region Unable to assess  Posterior Calf Region No depletion  Edema (RD Assessment) Mild  [BLE]  Hair Reviewed  Eyes Reviewed  Mouth Reviewed  [poor dentition]  Skin Reviewed  Nails Reviewed  [long and dirty]       Diet Order:   Diet Order            DIET DYS 3 Room service appropriate? Yes; Fluid consistency: Thin  Diet effective now                 EDUCATION NEEDS:   Not appropriate for  education at this time  Skin:  Skin Assessment: Skin Integrity Issues: Skin Integrity Issues:: Stage II,Incisions,Wound VAC Stage II: L buttocks Wound Vac: R thigh Incisions: R thigh and L wrist  Last BM:  4/7 (type 7 x1)  Height:   Ht Readings from Last 1 Encounters:  11/07/2020 5\' 5"  (1.651 m)    Weight:   Wt Readings from Last 1 Encounters:  11/09/2020 75.7 kg     Estimated Nutritional Needs:  Kcal:  2100-2300 kcal Protein:  100-115 grams Fluid:  >/= 2 L/day     12/27/20, MS, RD, LDN, CNSC Inpatient Clinical Dietitian RD pager # available in AMION  After hours/weekend pager # available in [facility]

## 2020-10-28 NOTE — Progress Notes (Signed)
  Speech Language Pathology Treatment: Dysphagia  Patient Details Name: Ronald Brady MRN: [id number] DOB: 04/23/1961 Today's Date: 10/28/2020 Time: 4081-4481 SLP Time Calculation (min) (ACUTE ONLY): 11 min  Assessment / Plan / Recommendation Clinical Impression  SLP session to determine tolerance of po diet and assess for indication of instrumental swallow evaluation.  Pt reports swallow is near his baseline ability.  RN reports pt continuing to have some coughing with intake - both during and after eating.  Pt's voice is strong and not gurgly during session.  Observed with liquid intake = intermittent throat clearing noted. Pt independently admits to some coughing with intake prior to admission - but with current weakness/deconditioning follow up briefly for dysphagia management indicated due to his increased risk of aspiration pna.  Provided pt with tissues and basin to help with clearance of secretions.  Using teach back and written precautions pt educated to importance of coughing and expectorating if sense need and upright posture with slow rate of po.  Pt able to verbalize reasoning for aspiration precautions with minimal cues - however suspect he eats at rapid rate.  Would not advise pt diet be advanced beyond dys3 due to edentulous status.  CXR November 07, 2020 showed:  Low lung volumes with bibasilar infiltrates. Small left pleural effusion cannot be excluded.  HPI HPI: Patient is a 60 y.o. male with PMH: DM, HTN, emphysema who was brought to ED via EMS after he was found by his sister at home. Patient had been sitting i a chair for four days unable to get up and patient reported he had torn hamstring which prevented him from getting up from chair. In addition, he apparently had not been eating anything or taking his insulin. On admission, he was hypothermic temperature 95.7, tachycardic, hypotensive BP, UA with moderate leukocytes, yeast, draining abcess noted on right thigh and left wrist, CT right  femur showed elongated gas and fluid collection along surface of hamstring muscles extending deep into hamstring muscle compatible with intramuscular abcess. He had MBS in 2021 secondary to suspected post-extubation dysphagia and at that time had been started on puree solids and pudding thick liquids.      SLP Plan  Continue with current plan of care       Recommendations  Diet recommendations: Dysphagia 3 (mechanical soft);Thin liquid Liquids provided via: Straw Medication Administration: Crushed with puree Compensations: Minimize environmental distractions;Slow rate;Small sips/bites Postural Changes and/or Swallow Maneuvers: Seated upright 90 degrees;Upright 30-60 min after meal                Oral Care Recommendations: Oral care BID Follow up Recommendations: Other (comment) (TBD) SLP Visit Diagnosis: Dysphagia, unspecified (R13.10) Plan: Continue with current plan of care       GO                Chales Abrahams 10/28/2020, 10:34 PM

## 2020-10-28 NOTE — Progress Notes (Signed)
[facility]  Date of Admission:  2020-11-14     CC: mssa bacteremia  Lines: peripheral  Abx: 4/04-c cefazolin  4/02-03 clinda, meropenem, vanc  ASSESSMENT: 60 yo male with dm admitted after found down/stuck between furniture, for severe sepsis (hypothermia) in setting abscess in RLE (posterior thigh) along with left volar wrist and distal forearm exposed tendon/soft tissue infection-abscess, and mssa bacteremia   #mssa bacteremia #severe sepsis with septic shock #soft tissue RLE and LUE abscesses 4/02 initial bcx mssa(and peptostreptococcus -- likely contaminant), source from abscess; s/p I&D 4/03; wound cx mssa 4/04 repeat bcx ngtd  Ct imaging LUE and RLE no osseous involvement 4/04 tte no obvious vegetation or significant regurgitation; pending tee Patient is s/p I&D on 4/03 and again 4/07. Extensive necrosis/infection involvement posterior thigh. There is possibility of even right hip disarticulation if no improvement.    -I agree with dr Ophelia Charter given mssa BSI and extensive involvement of the thigh, concern for other seeding. Would await tee to r/o endocarditis and imaging focal tender spots -I anticipate abx of at least 6 weeks for this patient -continue cefazolin 2 gram iv q8hrs for now -no need to add coverage for peptostreptococcus unless persistent bacteremia with it -q72 hours crp   #gib -management per GI team  I spent more than 35 minute reviewing data/chart, and coordinating care and >50% direct face to face time providing counseling/discussing diagnostics/treatment plan with patient    Principal Problem:   Severe sepsis with septic shock (HCC) Active Problems:   Abscess of right thigh   Abscess of left forearm   Diabetes mellitus (HCC)   Hyperosmolar hyperglycemic state (HHS) (HCC)   AKI (acute kidney injury) (HCC)   Acute lower UTI   Emphysema lung (HCC)   Abscess of right lower extremity   Bacteremia    Pressure injury of skin   Malnutrition of moderate degree   Allergies  Allergen Reactions  . Codeine Nausea And Vomiting  . Penicillins Other (See Comments)    Pt is unsure of reaction. States PCP told him he was allergic. Did it involve swelling of the face/tongue/throat, SOB, or low BP? Unknown Did it involve sudden or severe rash/hives, skin peeling, or any reaction on the inside of your mouth or nose? Unknown Did you need to seek medical attention at a hospital or doctor's office? Unknown When did it last happen?unk If all above answers are "NO", may proceed with cephalosporin use.     Scheduled Meds: . (feeding supplement) PROSource Plus  30 mL Oral BID BM  . chlorhexidine  15 mL Mouth Rinse BID  . Chlorhexidine Gluconate Cloth  6 each Topical Daily  . feeding supplement (GLUCERNA SHAKE)  237 mL Oral BID BM  . insulin aspart  0-5 Units Subcutaneous QHS  . insulin aspart  0-9 Units Subcutaneous TID WC  . insulin aspart  4 Units Subcutaneous TID WC  . insulin glargine  10 Units Subcutaneous QHS  . ipratropium-albuterol  3 mL Nebulization QID  . ketorolac  30 mg Intravenous Q6H  . mouth rinse  15 mL Mouth Rinse q12n4p  . metFORMIN  1,000 mg Oral BID WC  . midodrine  10 mg Oral TID WC  . mometasone-formoterol  2 puff Inhalation BID  . multivitamin with minerals  1 tablet Oral Daily  . nicotine  21 mg Transdermal Daily  . pantoprazole  40 mg Intravenous Q12H  . sodium chloride flush  10-40  mL Intracatheter Q12H   Continuous Infusions: .  ceFAZolin (ANCEF) IV 2 g (10/28/20 1444)  . lactated ringers 50 mL/hr at 10/28/20 0812  . norepinephrine (LEVOPHED) Adult infusion 2 mcg/min (10/28/20 1329)   PRN Meds:.albuterol, ALPRAZolam, dextrose, melatonin, metoCLOPramide **OR** metoCLOPramide (REGLAN) injection, morphine injection, ondansetron **OR** ondansetron (ZOFRAN) IV, oxyCODONE-acetaminophen, phenol, sodium chloride flush   SUBJECTIVE: S/p I&D again on 4/7 of rle and  lue.   I reviewed the operative note and discussed case with Dr Ophelia CharterYates There is EXTENSIVE pyogenic involvement and necrotic tissue right posterior thigh.   He remains on low dose pressors today  No f/c No n/v/diarrhea The original blood cx also now showed peptostreptococcus but not in repeat bcx    Review of Systems: ROS All other ROS was negative, except mentioned above     OBJECTIVE: Vitals:   10/28/20 1400 10/28/20 1415 10/28/20 1430 10/28/20 1445  BP: 90/61 (!) 94/56 107/64 (!) 164/78  Pulse: (!) 102 98 (!) 106 90  Resp: (!) 21 20 18 20   Temp:      TempSrc:      SpO2: 99% 99% 100% 99%  Weight:      Height:       Body mass index is 27.77 kg/m.  Physical Exam  General/constitutional: no distress, pleasant HEENT: Normocephalic, PER, Conj Clear, EOMI, Oropharynx clear Neck supple CV: rrr no mrg Lungs: clear to auscultation, normal respiratory effort Abd: Soft, Nontender Ext: no edema Skin: No Rash Neuro: nonfocal MSK: right LE dressing c/d/i, wound vac functioning; LUE dressing c/d/i     Right ij site no purulence/bleeding/tenderness    Lab Results Lab Results  Component Value Date   WBC 7.3 10/28/2020   HGB 8.7 (L) 10/28/2020   HCT 25.7 (L) 10/28/2020   MCV 85.7 10/28/2020   PLT 125 (L) 10/28/2020    Lab Results  Component Value Date   CREATININE 0.66 10/28/2020   BUN 18 10/28/2020   NA 136 10/28/2020   K 3.9 10/28/2020   CL 105 10/28/2020   CO2 24 10/28/2020    Lab Results  Component Value Date   ALT 7 10/26/2020   AST 11 (L) 10/26/2020   ALKPHOS 107 10/26/2020   BILITOT 0.7 10/26/2020      Microbiology: Recent Results (from the past 240 hour(s))  Blood culture (routine x 2)     Status: Abnormal   Collection Time: 11/08/2020  4:12 PM   Specimen: Right Antecubital; Blood  Result Value Ref Range Status   Specimen Description   Final    RIGHT ANTECUBITAL BOTTLES DRAWN AEROBIC AND ANAEROBIC Performed at [facility], [address] [place], [place] [address]    Special Requests   Final    Blood Culture results may not be optimal due to an inadequate volume of blood received in culture bottles Performed at [facility], [address] [place], [place] [address]    Culture  Setup Time   Final    GRAM POSITIVE COCCI IN BOTH AEROBIC AND ANAEROBIC BOTTLES Gram Stain Report Called to,Read Back By and Verified With: CRAWFORD,H@1015  BY MATTHEWS, B 4.3.22 CRITICAL VALUE NOTED.  VALUE IS CONSISTENT WITH PREVIOUSLY REPORTED AND CALLED VALUE. Performed at [facility], [address] [place], [place] [address]    Culture (A)  Final    STAPHYLOCOCCUS AUREUS SUSCEPTIBILITIES PERFORMED ON PREVIOUS CULTURE WITHIN THE LAST 5 DAYS. PEPTOSTREPTOCOCCUS SPECIES    Report Status 10/31/2020 FINAL  Final  Blood culture (routine x 2)  Status: Abnormal   Collection Time: 11/04/2020  4:13 PM   Specimen: Left Antecubital; Blood  Result Value Ref Range Status   Specimen Description   Final    LEFT ANTECUBITAL BOTTLES DRAWN AEROBIC AND ANAEROBIC Performed at [facility], [address] [place], [place] [address]    Special Requests   Final    Blood Culture adequate volume Performed at [facility], [address], [place], [place] [address]    Culture  Setup Time   Final    GRAM POSITIVE COCCI AEROBIC AND ANAEROBIC BOTTLE Gram Stain Report Called to,Read Back By and Verified With: CRAWFORD,H@1015  BY MATTHEWS,B 11/07/2020 Organism ID to follow CRITICAL RESULT CALLED TO, READ BACK BY AND VERIFIED WITH: S CHRISTY PHARMD @1453  11/17/2020 EB Performed at [facility], [address] [place], [place]    Culture STAPHYLOCOCCUS AUREUS (A)  Final   Report Status 10/25/2020 FINAL  Final   Organism ID, Bacteria STAPHYLOCOCCUS AUREUS  Final      Susceptibility   Staphylococcus aureus - MIC*    CIPROFLOXACIN <=0.5 SENSITIVE Sensitive     ERYTHROMYCIN <=0.25 SENSITIVE Sensitive     GENTAMICIN <=0.5 SENSITIVE Sensitive      OXACILLIN <=0.25 SENSITIVE Sensitive     TETRACYCLINE <=1 SENSITIVE Sensitive     VANCOMYCIN 1 SENSITIVE Sensitive     TRIMETH/SULFA <=10 SENSITIVE Sensitive     CLINDAMYCIN <=0.25 SENSITIVE Sensitive     RIFAMPIN <=0.5 SENSITIVE Sensitive     Inducible Clindamycin NEGATIVE Sensitive     * STAPHYLOCOCCUS AUREUS  Blood Culture ID Panel (Reflexed)     Status: Abnormal   Collection Time: 11/03/2020  4:13 PM  Result Value Ref Range Status   Enterococcus faecalis NOT DETECTED NOT DETECTED Final   Enterococcus Faecium NOT DETECTED NOT DETECTED Final   Listeria monocytogenes NOT DETECTED NOT DETECTED Final   Staphylococcus species DETECTED (A) NOT DETECTED Final    Comment: CRITICAL RESULT CALLED TO, READ BACK BY AND VERIFIED WITH: S CHRISTY PHARMD @1453  11/12/2020 EB    Staphylococcus aureus (BCID) DETECTED (A) NOT DETECTED Final    Comment: CRITICAL RESULT CALLED TO, READ BACK BY AND VERIFIED WITH: S CHRISTY PHARMD @1453  11/07/2020 EB    Staphylococcus epidermidis NOT DETECTED NOT DETECTED Final   Staphylococcus lugdunensis NOT DETECTED NOT DETECTED Final   Streptococcus species NOT DETECTED NOT DETECTED Final   Streptococcus agalactiae NOT DETECTED NOT DETECTED Final   Streptococcus pneumoniae NOT DETECTED NOT DETECTED Final   Streptococcus pyogenes NOT DETECTED NOT DETECTED Final   A.calcoaceticus-baumannii NOT DETECTED NOT DETECTED Final   Bacteroides fragilis NOT DETECTED NOT DETECTED Final   Enterobacterales NOT DETECTED NOT DETECTED Final   Enterobacter cloacae complex NOT DETECTED NOT DETECTED Final   Escherichia coli NOT DETECTED NOT DETECTED Final   Klebsiella aerogenes NOT DETECTED NOT DETECTED Final   Klebsiella oxytoca NOT DETECTED NOT DETECTED Final   Klebsiella pneumoniae NOT DETECTED NOT DETECTED Final   Proteus species NOT DETECTED NOT DETECTED Final   Salmonella species NOT DETECTED NOT DETECTED Final   Serratia marcescens NOT DETECTED NOT DETECTED Final   Haemophilus  influenzae NOT DETECTED NOT DETECTED Final   Neisseria meningitidis NOT DETECTED NOT DETECTED Final   Pseudomonas aeruginosa NOT DETECTED NOT DETECTED Final   Stenotrophomonas maltophilia NOT DETECTED NOT DETECTED Final   Candida albicans NOT DETECTED NOT DETECTED Final   Candida auris NOT DETECTED NOT DETECTED Final   Candida glabrata NOT DETECTED NOT DETECTED Final  Candida krusei NOT DETECTED NOT DETECTED Final   Candida parapsilosis NOT DETECTED NOT DETECTED Final   Candida tropicalis NOT DETECTED NOT DETECTED Final   Cryptococcus neoformans/gattii NOT DETECTED NOT DETECTED Final   Meth resistant mecA/C and MREJ NOT DETECTED NOT DETECTED Final    Comment: Performed at [facility], [address], [place], [place] [address]  Resp Panel by RT-PCR (Flu A&B, Covid) Nasopharyngeal Swab     Status: None   Collection Time: 11/04/2020  6:16 PM   Specimen: Nasopharyngeal Swab; Nasopharyngeal(NP) swabs in vial transport medium  Result Value Ref Range Status   SARS Coronavirus 2 by RT PCR NEGATIVE NEGATIVE Final    Comment: (NOTE) SARS-CoV-2 target nucleic acids are NOT DETECTED.  The SARS-CoV-2 RNA is generally detectable in upper respiratory specimens during the acute phase of infection. The lowest concentration of SARS-CoV-2 viral copies this assay can detect is 138 copies/mL. A negative result does not preclude SARS-Cov-2 infection and should not be used as the sole basis for treatment or other patient management decisions. A negative result may occur with  improper specimen collection/handling, submission of specimen other than nasopharyngeal swab, presence of viral mutation(s) within the areas targeted by this assay, and inadequate number of viral copies(<138 copies/mL). A negative result must be combined with clinical observations, patient history, and epidemiological information. The expected result is Negative.  Fact Sheet for Patients:   BloggerCourse.com  Fact Sheet for Healthcare Providers:  SeriousBroker.it  This test is no t yet approved or cleared by the [place] FDA and  has been authorized for detection and/or diagnosis of SARS-CoV-2 by FDA under an Emergency Use Authorization (EUA). This EUA will remain  in effect (meaning this test can be used) for the duration of the COVID-19 declaration under Section 564(b)(1) of the Act, 21 U.S.C.section 360bbb-3(b)(1), unless the authorization is terminated  or revoked sooner.       Influenza A by PCR NEGATIVE NEGATIVE Final   Influenza B by PCR NEGATIVE NEGATIVE Final    Comment: (NOTE) The Xpert Xpress SARS-CoV-2/FLU/RSV plus assay is intended as an aid in the diagnosis of influenza from Nasopharyngeal swab specimens and should not be used as a sole basis for treatment. Nasal washings and aspirates are unacceptable for Xpert Xpress SARS-CoV-2/FLU/RSV testing.  Fact Sheet for Patients: BloggerCourse.com  Fact Sheet for Healthcare Providers: SeriousBroker.it  This test is not yet approved or cleared by the [place] FDA and has been authorized for detection and/or diagnosis of SARS-CoV-2 by FDA under an Emergency Use Authorization (EUA). This EUA will remain in effect (meaning this test can be used) for the duration of the COVID-19 declaration under Section 564(b)(1) of the Act, 21 U.S.C. section 360bbb-3(b)(1), unless the authorization is terminated or revoked.  Performed at [facility], [address] [place], [place] [address]   Culture, Urine     Status: Abnormal   Collection Time: 11/06/2020 10:36 PM   Specimen: Urine, Clean Catch  Result Value Ref Range Status   Specimen Description   Final    URINE, CLEAN CATCH Performed at [facility], [address] [place], [place] [address]    Special Requests   Final    NONE Performed at [facility], [address] [place], [place] [address]    Culture >=100,000 COLONIES/mL STAPHYLOCOCCUS AUREUS (A)  Final   Report Status 10/26/2020 FINAL  Final   Organism ID, Bacteria STAPHYLOCOCCUS AUREUS (A)  Final      Susceptibility  Staphylococcus aureus - MIC*    CIPROFLOXACIN <=0.5 SENSITIVE Sensitive     GENTAMICIN <=0.5 SENSITIVE Sensitive     NITROFURANTOIN <=16 SENSITIVE Sensitive     OXACILLIN <=0.25 SENSITIVE Sensitive     TETRACYCLINE <=1 SENSITIVE Sensitive     VANCOMYCIN <=0.5 SENSITIVE Sensitive     TRIMETH/SULFA <=10 SENSITIVE Sensitive     CLINDAMYCIN <=0.25 SENSITIVE Sensitive     RIFAMPIN <=0.5 SENSITIVE Sensitive     Inducible Clindamycin NEGATIVE Sensitive     * >=100,000 COLONIES/mL STAPHYLOCOCCUS AUREUS  MRSA PCR Screening     Status: None   Collection Time: 10/28/2020  3:13 PM   Specimen: Nasopharyngeal  Result Value Ref Range Status   MRSA by PCR NEGATIVE NEGATIVE Final    Comment:        The GeneXpert MRSA Assay (FDA approved for NASAL specimens only), is one component of a comprehensive MRSA colonization surveillance program. It is not intended to diagnose MRSA infection nor to guide or monitor treatment for MRSA infections. Performed at [facility], [address] [place], [place] [address]   Aerobic/Anaerobic Culture w Gram Stain (surgical/deep wound)     Status: None (Preliminary result)   Collection Time: 11/09/2020  7:58 PM   Specimen: Wound  Result Value Ref Range Status   Specimen Description   Final    WOUND Performed at [facility], [address] [place], [place] [address]    Special Requests RIGHT HAMSTRING PATIENT ON FOLLOWING MERREM  Final   Gram Stain   Final    FEW WBC PRESENT,BOTH PMN AND MONONUCLEAR ABUNDANT GRAM POSITIVE COCCI IN PAIRS IN CLUSTERS Performed at [facility], [address] [place], [place] [address]    Culture   Final    ABUNDANT STAPHYLOCOCCUS AUREUS NO ANAEROBES  ISOLATED; CULTURE IN PROGRESS FOR 5 DAYS    Report Status PENDING  Incomplete   Organism ID, Bacteria STAPHYLOCOCCUS AUREUS  Final      Susceptibility   Staphylococcus aureus - MIC*    CIPROFLOXACIN <=0.5 SENSITIVE Sensitive     ERYTHROMYCIN <=0.25 SENSITIVE Sensitive     GENTAMICIN <=0.5 SENSITIVE Sensitive     OXACILLIN <=0.25 SENSITIVE Sensitive     TETRACYCLINE <=1 SENSITIVE Sensitive     VANCOMYCIN <=0.5 SENSITIVE Sensitive     TRIMETH/SULFA <=10 SENSITIVE Sensitive     CLINDAMYCIN <=0.25 SENSITIVE Sensitive     RIFAMPIN <=0.5 SENSITIVE Sensitive     Inducible Clindamycin NEGATIVE Sensitive     * ABUNDANT STAPHYLOCOCCUS AUREUS  Culture, blood (routine x 2)     Status: None (Preliminary result)   Collection Time: 10/24/20 11:48 AM   Specimen: BLOOD RIGHT HAND  Result Value Ref Range Status   Specimen Description   Final    BLOOD RIGHT HAND Performed at [facility], [address], [place], [place] [address]    Special Requests   Final    BOTTLES DRAWN AEROBIC AND ANAEROBIC Blood Culture adequate volume Performed at [facility], [address], [place], [place] [address]    Culture   Final    NO GROWTH 4 DAYS Performed at [facility], [address] [place], [place] [address]    Report Status PENDING  Incomplete  Culture, blood (routine x 2)     Status: None (Preliminary result)   Collection Time: 10/24/20 11:53 AM   Specimen: BLOOD RIGHT HAND  Result Value Ref Range Status   Specimen  Description   Final    BLOOD RIGHT HAND Performed at [facility], [address] [place], [place] [address]    Special Requests   Final    BOTTLES DRAWN AEROBIC AND ANAEROBIC Blood Culture adequate volume Performed at [facility], [address], [place], [place] [address]    Culture   Final    NO GROWTH 4 DAYS Performed at [facility], [address], [place], [place] [address]    Report  Status PENDING  Incomplete     Serology:   Imaging: If present, new imagings (plain films, ct scans, and mri) have been personally visualized and interpreted; radiology reports have been reviewed. Decision making incorporated into the Impression / Recommendations.  4/02 ct femur right Elongated gas and fluid collection noted along the surface of the hamstring muscles, in some areas extending deep into the hamstring muscles compatible with intramuscular abscess. This extends over an extensive area in length extending from the soft tissue defect in the proximal posteromedial right thigh distally to just above the knee.  No evidence of osteomyelitis.   4/03 ct left forearm Edema and locules of gas throughout the subcutaneous soft tissues in the anterior distal forearm. No well-defined focal abscess. This could be further evaluated with ultrasound if felt clinically Indicated.   4/06 abd pelv cta 1. No evidence for active arterial GI bleeding. 2. Diffuse gastric wall thickening and duodenal wall thickening, suggestive of gastritis and duodenitis. Incidentally noted small bowel malrotation. 3. Small bilateral pleural effusions with adjacent atelectasis. 4. Anasarca. 5. Distended urinary bladder.  Raymondo Band, MD [facility] [phone] pager    10/28/2020, 3:39 PM

## 2020-10-28 NOTE — TOC Progression Note (Signed)
Transition of Care [facility]) - Progression Note    Patient Details  Name: Ronald Brady MRN: [id number] Date of Birth: 1961-02-11  Transition of [facility]) CM/SW Contact  Golda Acre, RN Phone Number: 10/28/2020, 11:53 AM  Clinical Narrative:    tct-pam chandler-informed that patient may need long term iv abx, will follow.   Expected Discharge Plan: Home w Home Health Services Barriers to Discharge: Continued Medical Work up  Expected Discharge Plan and Services Expected Discharge Plan: Home w Home Health Services   Discharge Planning Services: CM Consult   Living arrangements for the past 2 months: Single Family Home                                       Social Determinants of Health (SDOH) Interventions    Readmission Risk Interventions No flowsheet data found.

## 2020-10-28 NOTE — Progress Notes (Signed)
   Subjective: 1 Day Post-Op Procedure(s) (LRB): IRRIGATION AND DEBRIDEMENT DISTAL THIGH WITH WOUND VAC REMOVAL & APPLICATION (Right) REAPPLICATION OF LEFT WRIST DRESSING, PACKED (Left) Patient reports pain as moderate.    Objective: Vital signs in last 24 hours: Temp:  [97.7 F (36.5 C)-99 F (37.2 C)] 99 F (37.2 C) (04/08 0800) Pulse Rate:  [94-113] 109 (04/08 1015) Resp:  [13-28] 25 (04/08 1015) BP: (81-141)/(48-82) 115/70 (04/08 1000) SpO2:  [96 %-100 %] 99 % (04/08 1015) Arterial Line BP: (91-175)/(45-84) 114/61 (04/08 1015) Weight:  [75.7 kg] 75.7 kg (04/07 1740)  Intake/Output from previous day: 04/07 0701 - 04/08 0700 In: 3277.7 [P.O.:440; I.V.:2079.5; Blood:315; IV Piggyback:443.2] Out: 1800 [Urine:1275; Drains:375; Blood:150] Intake/Output this shift: Total I/O In: 381.6 [P.O.:240; I.V.:41.6; IV Piggyback:100] Out: -   Recent Labs    10/25/2020 0021 10/26/2020 0453 11/04/2020 1320 10/26/2020 1912 10/28/20 0700  HGB 9.1* 8.5* 7.8* 6.5* 8.7*   Recent Labs    10/28/2020 0453 11/18/2020 1320 11/01/2020 1912 10/28/20 0700  WBC 8.4  --   --  7.3  RBC 3.00*  --   --  3.00*  HCT 25.2*   < > 19.0* 25.7*  PLT 135*  --   --  125*   < > = values in this interval not displayed.   Recent Labs    10/21/2020 0453 10/30/2020 1912 10/28/20 0700  NA 140 140 136  K 3.3* 3.9 3.9  CL 107  --  105  CO2 26  --  24  BUN 24*  --  18  CREATININE 0.62  --  0.66  GLUCOSE 195*  --  223*  CALCIUM 7.5*  --  7.1*   Recent Labs    10/26/20 0227  INR 1.4*    both right thigh VACs with bloody output less pus than before.  No results found.  Assessment/Plan: 1 Day Post-Op Procedure(s) (LRB): IRRIGATION AND DEBRIDEMENT DISTAL THIGH WITH WOUND VAC REMOVAL & APPLICATION (Right) REAPPLICATION OF LEFT WRIST DRESSING, PACKED (Left) Plan:  Continue IV ABX .  May need more surgery next week.  Will discuss with ID team.   Patient may have areas of purulence also on his body.  He is lost his  medial hamstring from necrosis.  Subcutaneous infection in his left volar forearm.  There may be infection there is seeded elsewhere.  Abdomen pelvis look good on CT scan.  May need additional studies.  Only surgical option to completely eradicate infection would be hip disarticulation on the right side.  Patient on appropriate antibiotics for MSSA. Ronald Brady 10/28/2020, 11:05 AM

## 2020-10-29 DIAGNOSIS — R6521 Severe sepsis with septic shock: Secondary | ICD-10-CM | POA: Diagnosis not present

## 2020-10-29 DIAGNOSIS — A419 Sepsis, unspecified organism: Secondary | ICD-10-CM | POA: Diagnosis not present

## 2020-10-29 LAB — GLUCOSE, CAPILLARY
Glucose-Capillary: 58 mg/dL — ABNORMAL LOW (ref 70–99)
Glucose-Capillary: 62 mg/dL — ABNORMAL LOW (ref 70–99)
Glucose-Capillary: 98 mg/dL (ref 70–99)
Glucose-Capillary: 53 mg/dL — ABNORMAL LOW (ref 70–99)
Glucose-Capillary: 66 mg/dL — ABNORMAL LOW (ref 70–99)
Glucose-Capillary: 94 mg/dL (ref 70–99)
Glucose-Capillary: 198 mg/dL — ABNORMAL HIGH (ref 70–99)
Glucose-Capillary: 129 mg/dL — ABNORMAL HIGH (ref 70–99)
Glucose-Capillary: 171 mg/dL — ABNORMAL HIGH (ref 70–99)

## 2020-10-29 LAB — CBC
HCT: 23.8 % — ABNORMAL LOW (ref 39.0–52.0)
Hemoglobin: 7.8 g/dL — ABNORMAL LOW (ref 13.0–17.0)
MCH: 29.4 pg (ref 26.0–34.0)
MCHC: 32.8 g/dL (ref 30.0–36.0)
MCV: 89.8 fL (ref 80.0–100.0)
Platelets: 121 10*3/uL — ABNORMAL LOW (ref 150–400)
RBC: 2.65 MIL/uL — ABNORMAL LOW (ref 4.22–5.81)
RDW: 15.9 % — ABNORMAL HIGH (ref 11.5–15.5)
WBC: 6.3 10*3/uL (ref 4.0–10.5)
nRBC: 0 % (ref 0.0–0.2)

## 2020-10-29 LAB — C-REACTIVE PROTEIN: CRP: 9 mg/dL — ABNORMAL HIGH (ref <1.0)

## 2020-10-29 LAB — BASIC METABOLIC PANEL
Anion gap: 7 (ref 5–15)
BUN: 21 mg/dL — ABNORMAL HIGH (ref 6–20)
CO2: 26 mmol/L (ref 22–32)
Calcium: 6.8 mg/dL — ABNORMAL LOW (ref 8.9–10.3)
Chloride: 104 mmol/L (ref 98–111)
Creatinine, Ser: 0.62 mg/dL (ref 0.61–1.24)
GFR, Estimated: >60 mL/min (ref >60)
Glucose, Bld: 127 mg/dL — ABNORMAL HIGH (ref 70–99)
Potassium: 2.8 mmol/L — ABNORMAL LOW (ref 3.5–5.1)
Sodium: 137 mmol/L (ref 135–145)

## 2020-10-29 LAB — AEROBIC/ANAEROBIC CULTURE W GRAM STAIN (SURGICAL/DEEP WOUND)

## 2020-10-29 LAB — CULTURE, BLOOD (ROUTINE X 2)
Culture: NO GROWTH
Culture: NO GROWTH
Special Requests: ADEQUATE
Special Requests: ADEQUATE

## 2020-10-29 MED ORDER — POTASSIUM CHLORIDE 10 MEQ/50ML IV SOLN
10.0000 meq | INTRAVENOUS | Status: AC
  Administered 2020-10-29 (×2): 10 meq via INTRAVENOUS
  Filled 2020-10-29: qty 50

## 2020-10-29 MED ORDER — BISACODYL 5 MG PO TBEC
5.0000 mg | DELAYED_RELEASE_TABLET | Freq: Every day | ORAL | Status: DC | PRN
  Administered 2020-10-29: 5 mg via ORAL
  Filled 2020-10-29: qty 1

## 2020-10-29 MED ORDER — POTASSIUM CHLORIDE 20 MEQ PO PACK
40.0000 meq | PACK | ORAL | Status: AC
  Administered 2020-10-29 (×2): 40 meq via ORAL
  Filled 2020-10-29 (×2): qty 2

## 2020-10-29 MED ORDER — FUROSEMIDE 10 MG/ML IJ SOLN
20.0000 mg | Freq: Once | INTRAMUSCULAR | Status: AC
  Administered 2020-10-29: 20 mg via INTRAVENOUS
  Filled 2020-10-29: qty 2

## 2020-10-29 MED ORDER — DEXTROSE 50 % IV SOLN
12.5000 g | INTRAVENOUS | Status: AC
  Administered 2020-10-29: 12.5 g via INTRAVENOUS

## 2020-10-29 MED ORDER — POTASSIUM CHLORIDE 10 MEQ/50ML IV SOLN
10.0000 meq | INTRAVENOUS | Status: DC
  Administered 2020-10-29 (×2): 10 meq via INTRAVENOUS
  Filled 2020-10-29 (×3): qty 50

## 2020-10-29 MED ORDER — INSULIN ASPART 100 UNIT/ML ~~LOC~~ SOLN
2.0000 [IU] | Freq: Three times a day (TID) | SUBCUTANEOUS | Status: DC
  Administered 2020-10-29 – 2020-10-31 (×8): 2 [IU] via SUBCUTANEOUS

## 2020-10-29 NOTE — Progress Notes (Signed)
K+2.8 Replaced per protocol  

## 2020-10-29 NOTE — Progress Notes (Signed)
Pharmacy Antibiotic Note  Ronald Brady is a 60 y.o. male admitted on 10/21/2020 with skin infection and bacteremia.  Pharmacy has been consulted for cefazolin dosing for MSSA bacteremia. 10/29/2020  D#7 ancef, D#8 total abx MSSA abscess of R thigh, L wrist MSSA bacteremia MSSA UTI Levophed off this AM AF, WBC 6.3, SCr 0.62. CRP 9 4/4 TTE neg for vegetation   Plan: Continue Cefazolin 2 gm IV q 8 hours.  Will f/u renal function, culture results, and clinical course.  F/u ID recs  Height: 5\' 5"  (165.1 cm) Weight: 75.7 kg (166 lb 14.2 oz) IBW/kg (Calculated) : 61.5  Temp (24hrs), Avg:97.7 F (36.5 C), Min:97.3 F (36.3 C), Max:98.1 F (36.7 C)  Recent Labs  Lab 10/30/2020 1708 11/14/2020 1900 10/23/2020 2157 10/24/2020 2311 11/03/2020 0319 11/08/2020 1315 10/24/20 1150 10/24/20 1830 10/25/20 0356 10/25/20 1953 10/26/20 0353 11-04-20 0021 11/04/20 0453 10/28/20 0700 10/29/20 0300  WBC  --   --   --   --   --    < >  --   --  8.7   < > 12.8* 9.3 8.4 7.3 6.3  CREATININE  --   --   --    < > 0.83   < >  --    < > 0.52*  --  0.52*  --  0.62 0.66 0.62  LATICACIDVEN 3.7* 3.8* 7.2*  --  2.6*  --  1.5  --   --   --   --   --   --   --   --    < > = values in this interval not displayed.    Estimated Creatinine Clearance: 95.7 mL/min (by C-G formula based on SCr of 0.62 mg/dL).    Allergies  Allergen Reactions  . Codeine Nausea And Vomiting  . Penicillins Other (See Comments)    Pt is unsure of reaction. States PCP told him he was allergic. Did it involve swelling of the face/tongue/throat, SOB, or low BP? Unknown Did it involve sudden or severe rash/hives, skin peeling, or any reaction on the inside of your mouth or nose? Unknown Did you need to seek medical attention at a hospital or doctor's office? Unknown When did it last happen?unk If all above answers are "NO", may proceed with cephalosporin use.   Antimicrobials this admission: 4/2 vancomycin x 1  4/3 fluconazole  x 1 4/2 clindamycin >> 4/3 4/2 meropneme >> 4/3 4/3 cefazolin >> Dose adjustments this admission: Microbiology results: 4/2 BCx: GPC in 3/4, MSSA per BCID, peptostreptococcus species (likely contaminant per Vu/ID)  4/2 UCx: > 100 K MSSA 4/3 MRSA PCR: neg 4/3 R hamstring wound: abundant MSSA  4/4 BCx2: ngtd  Thank you for allowing pharmacy to be a part of this patient's care.  6/4, Pharm.D 10/29/2020 10:11 AM

## 2020-10-29 NOTE — Progress Notes (Signed)
Spoke with ICU RN, made aware PICC would not be placed today.

## 2020-10-29 NOTE — Progress Notes (Signed)
OT Cancellation Note  Patient Details Name: Ronald Brady MRN: [id number] DOB: 07-28-1960   Cancelled Treatment:    Reason Eval/Treat Not Completed: Other (comment). Patient adamantly refuses therapy. Patient reporting he doesn't want to get up until after his wounds have healed. He is under the impression that one of his physicians is agreeable with him not performing therapy until after this wounds heal and stated "they are gonna be talking to you about that." RN in room during patient's refusal. Patient educated by therapist and RN that therapy and mobility is important and that continued lack of mobility will lead to further decline and potentially being bed bound. Patient reports he needs whirlpool therapy and that he is going to be getting that at rehab in [place]. Will continue to follow patient but patient may need ALL physicians to reiterate the need for therapy during hospital stay.  [name] [name] 10/29/2020, 4:15 PM

## 2020-10-29 NOTE — Progress Notes (Signed)
**Note Ronald via Obfuscation** NAME:  Ronald Brady, MRN:  [id number], DOB:  Oct 15, 1960, LOS: 7 ADMISSION DATE:  11/16/2020, CONSULTATION DATE:  4/3 REFERRING MD:  [facility], CHIEF COMPLAINT:  sepsis   History of Present Illness:  60 y/o M, smoker, with poorly controlled DM who presented to [facility] 4/2 after being found at home in a chair covered in urine & feces.  Seen with multiple wounds   Pertinent  Medical History  Tobacco Abuse - began smoking at 63 Worked as a logger  Poorly controlled DM - Hgb A1c 11.8 in 07/2019  HTN   Significant Hospital Events: Including procedures, antibiotic start and stop dates in addition to other pertinent events    4/02 presented to ER in shock due to multiple abscess (right thigh, left wrist), DKA. CT femur found elongated gas and fluid collection in the right hamstring c/w abscess. No evidence of osteomyelitis. Treated with clinda, meropenem, cultured, central line placed. I&D in ER.   4/03 Wrist CT with gas throughout the SQ tissues in the anterior distal forearm. Ortho consulted > taken to OR for debridement of right posterior hamstring and left forearm. Intraoperative cultures obtained. Cultures growing staph aureus (sensitivities pending). Vanco added but abx narrowed to cefazolin late pm  4/04 Ortho note reflects concern for poor wound healing, may need hip disarticulation. On levophed 1-2 mcg, BCID with staph aureus, UC 100k staph aureus.  ECHO 60-65% EF, no evidence of vegetation mentioned  4/5 CT Femur w/o contrast with multilocular fluid collection in the lower hamstrings muscles at the level of the mid to distal femur with foci of SQ emphysema  4/6 GIB overnight, Hgb down to 5.9, tx 2 units PRBC's, 1 unit FFP, PPI gtt initiated + IVF.  Follow up Hgb 7.3, remains on low dose pressors. GI consulted.   4/8 Repeat I&D of left wrist and right thigh, extensive purulent material with new abscess seen near pelvis   4/9 weaning pressors, decreased meal time insulin  Interim  History / Subjective:  No acute issues overnight  Resting this AM BG low overnight  Objective   Blood pressure 121/71, pulse 90, temperature 97.6 F (36.4 C), temperature source Axillary, resp. rate 17, height 5\' 5"  (1.651 m), weight 75.7 kg, SpO2 100 %. CVP:  [4 mmHg-7 mmHg] 5 mmHg  FiO2 (%):  [21 %] 21 %   Intake/Output Summary (Last 24 hours) at 10/29/2020 0830 Last data filed at 10/29/2020 12/29/2020 Gross per 24 hour  Intake 2497.66 ml  Output 1325 ml  Net 1172.66 ml   Filed Weights   10/23/2020 1510 10/24/20 0515 11/09/2020 1740  Weight: 70 kg 75.7 kg 75.7 kg    Examination: General: Chronically ill appearing deconditioned middle aged male lying in bed in NAD CV: s1s2 regular rate and rhythm, no murmur, rubs, or gallops,  PULM:  Clear to ascultation, no added breath sounds, no increased work of breathing GI: soft, bowel sounds active in all 4 quadrants, non-tender, non-distended, tolerating diet, no further signs of GIB Extremities: warm/dry, no edema  Skin: no rashes or lesions  Labs/imaging that I have   4/2 blood cultures > Positive for Staph aureus   4/2 CT femur > Elongated gas and fluid collection noted along the surface of the hamstring muscles, in some areas extending deep into the hamstring muscles compatible with intramuscular abscess. This extends over an extensive area in length extending from the soft tissue defect in the proximal posteromedial right thigh distally to just above the knee.  4/3  wound culture > Positive Staph aureus   4/3 CT wrist > Edema and locules of gas throughout the subcutaneous soft tissues in the anterior distal forearm. No well-defined focal abscess. This could be further evaluated with ultrasound if felt clinically indicated.  4/5 CT femur > There remains a multilocular fluid collection within the lower hamstrings muscles at the level of the mid to distal femur with foci of subcutaneous emphysema measuring 5.4 x 3.7 x 3 x 6.0 cm.  4/6 CTA  ABD/Pelvis > No evidence for active arterial GI bleeding. Diffuse gastric wall thickening and duodenal wall thickening, suggestive of gastritis and duodenitis. Incidentally noted small bowel malrotation. Small bilateral pleural effusions with adjacent atelectasis.  Resolved Hospital Problem list     Assessment & Plan:   Septic Shock secondary MSSA Abscess of R Thigh, L Wrist with MSSA, MSSA bacteremia,  component of hypovolemia -TTE w/o evidence of endocarditis -TEE needed P: ID and Ortho following, appreciate assistance Culture data as above Remains on low dose Levo, weaning MAP goal > 65 Continue IV Cefazolin Pain control  Continue wound vacs   Poorly Controlled DM with Hyperglycemia  -Hyperglycemia on admit with glucose >900, small ketones in urine.  Hgb A1c 12.9 -BG better with insulin P: Continue SSI, Lantus, and meal coverage - decreased meal coverage with hypoglycemia overnight 4/9 Continue home metformin   GIB, improved  -BRBPR, suspect diverticular bleed  -CTA ABD/pevis with no arterial bleed  P: GI assess and have now signed off as of 4/7 Conservative measures, due to critical illness Supportive care  Hgb stable at 8.7 PPI  Hypokalemia, in setting of lasix, poor PO intake P: Trend Bmet  Supplement as needed   Anemia  P: Trend CBC  Transfuse per protocol Hgb goal greater than 7  Active Smoker, Suspected COPD / Asthmatic Bronchitis P: Continue BDs Encourage pulmonary hygiene  Smoking cessation encouraged   Best practice   Diet:  Oral Pain/Anxiety/Delirium protocol (if indicated): No VAP protocol (if indicated): Not indicated DVT prophylaxis: SCD given recent GI bleed and need for pressors GI prophylaxis: PPI Glucose control:  SSI Yes and Basal insulin Yes Central venous access:  Yes, and it is still needed Arterial line:  N/A Foley:  Yes, and it is still needed Mobility:  OOB  PT consulted: N/A Last date of multidisciplinary goals of care  discussion n/a Code Status:  full code Disposition: ICU    CRITICAL CARE Performed by: Lesia Sago Zabdi Mis  Total critical care time: 35 minutes  Critical care time was exclusive of separately billable procedures and treating other patients.  Critical care was necessary to treat or prevent imminent or life-threatening deterioration.  Critical care was time spent personally by me on the following activities: development of treatment plan with patient and/or surrogate as well as nursing, discussions with consultants, evaluation of patient's response to treatment, examination of patient, obtaining history from patient or surrogate, ordering and performing treatments and interventions, ordering and review of laboratory studies, ordering and review of radiographic studies, pulse oximetry and re-evaluation of patient's condition.  Karren Burly, MD [facility] Personal contact information can be found on AMION, please see AMION for correct PCCM contact after 7p 10/29/2020, 8:30 AM

## 2020-10-29 NOTE — Progress Notes (Signed)
eLink Physician-Brief Progress Note Patient Name: Ronald Brady DOB: [birth year] MRN: [id number]   Date of Service  10/29/2020  HPI/Events of Note  Constipation. No BM since 4/7.  eICU Interventions  Ordered bisacodyl 5mg  PO daily PRN.     Intervention Category Minor Interventions: Routine modifications to care plan (e.g. PRN medications for pain, fever)  Ronald Brady 10/29/2020, 9:27 PM

## 2020-10-29 NOTE — Progress Notes (Signed)
One of patient's wound vacs on R Thigh was alarming blockage. After extensive trouble shooting and intervention by RN, On-call ortho MD Magnus Ivan) was paged. RN received permission from Dr. Magnus Ivan to change Wound vac dressing. No complications noted by RN during dressing change.This RN will continue to carefully monitor pt.

## 2020-10-29 NOTE — Progress Notes (Signed)
RN was able to titrate pt off levo this AM. Levophed orders were discontinued by pharmacy. SBP however is now 84. Dr. Judeth Horn notified by this RN and orders received to maintain SBP >80 and MAP > 60. Maintenance fluids were also discontinued this AM with plans to diurese pt in afternoon d/t elevated CVP at 15.   This RN will continue to monitor BP and give midodrine as scheduled. RN will notify physician if BP falls below goal.

## 2020-10-30 DIAGNOSIS — A419 Sepsis, unspecified organism: Secondary | ICD-10-CM | POA: Diagnosis not present

## 2020-10-30 DIAGNOSIS — R6521 Severe sepsis with septic shock: Secondary | ICD-10-CM | POA: Diagnosis not present

## 2020-10-30 LAB — BASIC METABOLIC PANEL
Anion gap: 5 (ref 5–15)
BUN: 26 mg/dL — ABNORMAL HIGH (ref 6–20)
CO2: 26 mmol/L (ref 22–32)
Calcium: 6.7 mg/dL — ABNORMAL LOW (ref 8.9–10.3)
Chloride: 104 mmol/L (ref 98–111)
Creatinine, Ser: 0.64 mg/dL (ref 0.61–1.24)
GFR, Estimated: >60 mL/min (ref >60)
Glucose, Bld: 179 mg/dL — ABNORMAL HIGH (ref 70–99)
Potassium: 4.1 mmol/L (ref 3.5–5.1)
Sodium: 135 mmol/L (ref 135–145)

## 2020-10-30 LAB — GLUCOSE, CAPILLARY
Glucose-Capillary: 186 mg/dL — ABNORMAL HIGH (ref 70–99)
Glucose-Capillary: 174 mg/dL — ABNORMAL HIGH (ref 70–99)
Glucose-Capillary: 175 mg/dL — ABNORMAL HIGH (ref 70–99)

## 2020-10-30 MED ORDER — NOREPINEPHRINE 4 MG/250ML-% IV SOLN
0.0000 ug/min | INTRAVENOUS | Status: DC
  Administered 2020-10-30 (×2): 2 ug/min via INTRAVENOUS
  Administered 2020-10-31: 3 ug/min via INTRAVENOUS
  Administered 2020-10-31: 6 ug/min via INTRAVENOUS
  Administered 2020-11-01: 4 ug/min via INTRAVENOUS
  Administered 2020-11-01: 40 ug/min via INTRAVENOUS
  Administered 2020-11-01: 2 ug/min via INTRAVENOUS
  Administered 2020-11-02: 10 ug/min via INTRAVENOUS
  Administered 2020-11-02: 15 ug/min via INTRAVENOUS
  Administered 2020-11-02: 16 ug/min via INTRAVENOUS
  Administered 2020-11-02: 40 ug/min via INTRAVENOUS
  Administered 2020-11-03: 10 ug/min via INTRAVENOUS
  Administered 2020-11-03: 2 ug/min via INTRAVENOUS
  Administered 2020-11-04: 5 ug/min via INTRAVENOUS
  Administered 2020-11-04: 7 ug/min via INTRAVENOUS
  Administered 2020-11-05: 8 ug/min via INTRAVENOUS
  Filled 2020-10-30 (×11): qty 250
  Filled 2020-10-30: qty 500
  Filled 2020-10-30 (×4): qty 250

## 2020-10-30 MED ORDER — SODIUM CHLORIDE 0.9% FLUSH
10.0000 mL | Freq: Two times a day (BID) | INTRAVENOUS | Status: DC
  Administered 2020-10-30: 10 mL
  Administered 2020-10-31: 20 mL
  Administered 2020-10-31 – 2020-11-01 (×3): 10 mL
  Administered 2020-11-02: 20 mL
  Administered 2020-11-02: 10 mL
  Administered 2020-11-03: 40 mL
  Administered 2020-11-04: 10 mL

## 2020-10-30 MED ORDER — SODIUM CHLORIDE 0.9% FLUSH: 10.0000 mL | INTRAVENOUS | Status: DC | PRN

## 2020-10-30 MED ORDER — IPRATROPIUM-ALBUTEROL 0.5-2.5 (3) MG/3ML IN SOLN
3.0000 mL | Freq: Two times a day (BID) | RESPIRATORY_TRACT | Status: DC
  Administered 2020-10-30 – 2020-11-04 (×9): 3 mL via RESPIRATORY_TRACT
  Filled 2020-10-30 (×12): qty 3

## 2020-10-30 MED ORDER — LACTATED RINGERS IV BOLUS
1000.0000 mL | Freq: Once | INTRAVENOUS | Status: AC
  Administered 2020-10-30: 1000 mL via INTRAVENOUS

## 2020-10-30 NOTE — Progress Notes (Signed)
Pt refused morning 8 am & 10 am oral medications. Pt stated he did not like the taste of it. Pt stated "it tastes gross". I gave him options to wash meds down with juices, but pt still refused. Pt was told the importance of medications and the consequences of refusing meant longer stay at the hospital and delayed healing. Pt refused meds still so Dr. Judeth Horn was notified. Dr. Judeth Horn will have a discussion with pt about medications. Pt stable and set up in bed eating breakfast.

## 2020-10-30 NOTE — Progress Notes (Signed)
Peripherally Inserted Central Catheter Placement  The IV Nurse has discussed with the patient and/or persons authorized to consent for the patient, the purpose of this procedure and the potential benefits and risks involved with this procedure.  The benefits include less needle sticks, lab draws from the catheter, and the patient may be discharged home with the catheter. Risks include, but not limited to, infection, bleeding, blood clot (thrombus formation), and puncture of an artery; nerve damage and irregular heartbeat and possibility to perform a PICC exchange if needed/ordered by physician.  Alternatives to this procedure were also discussed.  Bard Power PICC patient education guide, fact sheet on infection prevention and patient information card has been provided to patient /or left at bedside.    PICC Placement Documentation  PICC Double Lumen 10/30/20 PICC Right Basilic 41 cm 0 cm (Active)  Exposed Catheter (cm) 0 cm 10/30/20 0947  Site Assessment Clean;Dry;Intact 10/30/20 0947  Lumen #1 Status Flushed;Saline locked;Blood return noted 10/30/20 0947  Lumen #2 Status Flushed;Saline locked;Blood return noted 10/30/20 0947  Dressing Type Transparent;Securing device 10/30/20 0947  Dressing Status Clean;Dry;Intact 10/30/20 0947  Antimicrobial disc in place? Yes 10/30/20 0947  Safety Lock Not Applicable 10/30/20 0947  Dressing Change Due 11/06/20 10/30/20 0947       Silva Aamodt, Norton Pastel 10/30/2020, 9:52 AM

## 2020-10-30 NOTE — Progress Notes (Signed)
eLink Physician-Brief Progress Note Patient Name: RAEL YO DOB: [birth year] MRN: [id number]   Date of Service  10/30/2020  HPI/Events of Note  SBP 66-72/42-54 (MAP 54). He has known infection and team has been weaning him off pressors lately (currently off).   eICU Interventions  1L LR bolus ordered. If he does not exhibit fluid responsiveness, start levophed to maintain MAP 65.     Intervention Category Major Interventions: Hypotension - evaluation and management  Janae Bridgeman 10/30/2020, 4:27 AM

## 2020-10-30 NOTE — Plan of Care (Signed)

## 2020-10-30 NOTE — Progress Notes (Signed)
NAME:  Ronald Brady, MRN:  [id number], DOB:  Oct 16, 1960, LOS: 8 ADMISSION DATE:  10/28/2020, CONSULTATION DATE:  4/3 REFERRING MD:  [facility], CHIEF COMPLAINT:  sepsis   History of Present Illness:  60 y/o M, smoker, with poorly controlled DM who presented to [facility] 4/2 after being found at home in a chair covered in urine & feces.  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 CT femur found elongated gas and fluid collection in the right hamstring c/w abscess. No evidence of osteomyelitis. Treated with clinda, meropenem, cultured, central line placed. I&D in ER.   4/03 Wrist CT with gas throughout the SQ tissues in the anterior distal forearm. Ortho consulted > taken to OR for debridement of right posterior hamstring and left forearm. Intraoperative cultures obtained. Cultures growing staph aureus (sensitivities pending). Vanco added but abx narrowed to cefazolin late pm  4/04 Ortho note reflects concern for poor wound healing, may need hip disarticulation. On levophed 1-2 mcg, BCID with staph aureus, UC 100k staph aureus.  ECHO 60-65% EF, no evidence of vegetation mentioned  4/5 CT Femur w/o contrast with multilocular fluid collection in the lower hamstrings muscles at the level of the mid to distal femur with foci of SQ emphysema  4/6 GIB overnight, Hgb down to 5.9, tx 2 units PRBC's, 1 unit FFP, PPI gtt initiated + IVF.  Follow up Hgb 7.3, remains on low dose pressors. GI consulted.   4/8 Repeat I&D of left wrist and right thigh, extensive purulent material with new abscess seen near pelvis   4/9 weaning pressors, decreased meal time insulin  4/10  intermittent hypotension, stop diuresis  Interim History / Subjective:  No acute issues overnight  Resting this AM BG low overnight  Objective   Blood pressure (!) 92/58, pulse 99, temperature 98.4 F (36.9 C), temperature source Oral, resp. rate (!) 21, height 5\' 5"  (1.651 m), weight 75.7 kg, SpO2 100 %. CVP:  [5 mmHg-10 mmHg] 5 mmHg  FiO2 (%):  [21 %] 21 %   Intake/Output Summary (Last 24 hours) at 10/30/2020 0856 Last data filed at 10/30/2020 0700 Gross per 24 hour  Intake 1901.58 ml  Output 1495 ml  Net 406.58 ml   Filed Weights   11/07/2020 1510 10/24/20 0515 10/24/2020 1740  Weight: 70 kg 75.7 kg 75.7 kg    Examination: General: Chronically ill appearing deconditioned middle aged male lying in bed in NAD CV: s1s2 regular rate and rhythm, no murmur, rubs, or gallops,  PULM:  Clear to ascultation, no added breath sounds, no increased work of breathing GI: soft, bowel sounds active in all 4 quadrants, non-tender, non-distended, tolerating diet, no further signs of GIB Extremities: warm/dry, no edema  Skin: no rashes or lesions  Labs/imaging that I have   4/2 blood cultures > Positive for Staph aureus   4/2 CT femur > Elongated gas and fluid collection noted along the surface of the hamstring muscles, in some areas extending deep into the hamstring muscles compatible with intramuscular abscess. This extends over an extensive area in length extending from the soft tissue defect in the proximal posteromedial right thigh  distally to just above the knee.  4/3 wound culture > Positive Staph aureus   4/3 CT wrist > Edema and locules of gas throughout the subcutaneous soft tissues in the anterior distal forearm. No well-defined focal abscess. This could be further evaluated with ultrasound if felt clinically indicated.  4/5 CT femur > There remains a multilocular fluid collection within the lower hamstrings muscles at the level of the mid to distal femur with foci of subcutaneous  emphysema measuring 5.4 x 3.7 x 3 x 6.0 cm.  4/6 CTA ABD/Pelvis > No evidence for active arterial GI bleeding. Diffuse gastric wall thickening and duodenal wall thickening, suggestive of gastritis and duodenitis. Incidentally noted small bowel malrotation. Small bilateral pleural effusions with adjacent atelectasis.  Resolved Hospital Problem list     Assessment & Plan:   Septic Shock secondary MSSA Abscess of R Thigh, L Wrist with MSSA, MSSA bacteremia,  component of hypovolemia -TTE w/o evidence of endocarditis -TEE needed P: ID and Ortho following, appreciate assistance Culture data as above Remains on low dose Levo, weaning MAP goal > 65 Continue IV Cefazolin Pain control  Continue wound vacs   Poorly Controlled DM with Hyperglycemia  -Hyperglycemia on admit with glucose >900, small ketones in urine.  Hgb A1c 12.9 -BG better with insulin P: Continue SSI, Lantus, and meal coverage - decreased meal coverage with hypoglycemia overnight 4/9 Continue home metformin - refusing  GIB, improved  -BRBPR, suspect diverticular bleed  -CTA ABD/pevis with no arterial bleed  P: GI assess and have now signed off as of 4/7 Conservative measures, due to critical illness Supportive care  Hgb stable PPI  Hypokalemia, in setting of lasix, poor PO intake P: Trend Bmet  Supplement as needed   Anemia  P: Trend CBC  Transfuse per protocol Hgb goal greater than 7  Active Smoker, Suspected COPD / Asthmatic Bronchitis P: Continue BDs Encourage pulmonary hygiene  Smoking cessation encouraged   Best practice   Diet:  Oral Pain/Anxiety/Delirium protocol (if indicated): No VAP protocol (if indicated): Not indicated DVT prophylaxis: SCD given recent GI bleed and need for pressors GI prophylaxis: PPI Glucose control:  SSI Yes and Basal insulin Yes Central venous access:  Yes, and it is still needed Arterial line:  N/A Foley:  Yes, and it is still needed Mobility:  OOB  PT  consulted: N/A Last date of multidisciplinary goals of care discussion n/a Code Status:  full code Disposition: ICU    CRITICAL CARE Performed by: Karren Burly  Total critical care time: 33 minutes  Critical care time was exclusive of separately billable procedures and treating other patients.  Critical care was necessary to treat or prevent imminent or life-threatening deterioration.  Critical care was time spent personally by me on the following activities: development of treatment plan with patient and/or surrogate as well as nursing, discussions with consultants, evaluation of patient's response to treatment, examination of patient, obtaining history from patient or surrogate, ordering and performing treatments and interventions, ordering and review of laboratory studies, ordering and review of radiographic studies, pulse oximetry and re-evaluation of patient's condition.  Karren Burly, MD [facility] Personal contact information can be found on AMION, please see AMION for correct PCCM contact after 7p 10/30/2020, 8:56 AM

## 2020-10-31 ENCOUNTER — Encounter (HOSPITAL_COMMUNITY): Payer: Self-pay | Admitting: Orthopaedic Surgery

## 2020-10-31 DIAGNOSIS — R6521 Severe sepsis with septic shock: Secondary | ICD-10-CM | POA: Diagnosis not present

## 2020-10-31 DIAGNOSIS — L02415 Cutaneous abscess of right lower limb: Secondary | ICD-10-CM | POA: Diagnosis not present

## 2020-10-31 DIAGNOSIS — E1169 Type 2 diabetes mellitus with other specified complication: Secondary | ICD-10-CM | POA: Diagnosis not present

## 2020-10-31 DIAGNOSIS — L02414 Cutaneous abscess of left upper limb: Secondary | ICD-10-CM | POA: Diagnosis not present

## 2020-10-31 DIAGNOSIS — A419 Sepsis, unspecified organism: Secondary | ICD-10-CM | POA: Diagnosis not present

## 2020-10-31 LAB — GLUCOSE, CAPILLARY
Glucose-Capillary: 171 mg/dL — ABNORMAL HIGH (ref 70–99)
Glucose-Capillary: 262 mg/dL — ABNORMAL HIGH (ref 70–99)
Glucose-Capillary: 97 mg/dL (ref 70–99)
Glucose-Capillary: 188 mg/dL — ABNORMAL HIGH (ref 70–99)
Glucose-Capillary: 164 mg/dL — ABNORMAL HIGH (ref 70–99)
Glucose-Capillary: 161 mg/dL — ABNORMAL HIGH (ref 70–99)

## 2020-10-31 MED ORDER — CHLORHEXIDINE GLUCONATE 4 % EX LIQD
60.0000 mL | Freq: Once | CUTANEOUS | Status: AC
  Administered 2020-11-01: 4 via TOPICAL
  Filled 2020-10-31: qty 60

## 2020-10-31 NOTE — Progress Notes (Signed)
Physical Therapy Treatment Patient Details Name: Ronald Brady MRN: [id number] DOB: [birth year] Today's Date: 10/31/2020    History of Present Illness 60 y.o. male presenting with to ED after being found by his sister sitting in a chair x4 days covered in excrement. Patient found to have abscess on R proximal posterior upper leg s/p 2x I&D and wound vac placement. Patient also found to have L proximal wrist wound and wound on ulnar aspect of wrist. Patient reports R hamstring injury x several weeks. Patient admitted with severe septic shock 2/2 fascciitis, AKI, hyperosmolar hyperglycemic state and UTI. PMHx significant for uncontrolled DMII, emphysema, HTN, and tobacco use.    PT Comments    Pt in bed with RN at bedside. General Comments: AxO x 3 required increased time "now talk me through this" and "don't touch my leg". otherwise cooperative Assisted OOB to recliner required increased time and freq breaks.  General bed mobility comments: pt able to slide his his legs to EOB but did require Mod Assist for upper body support.  Required increased time to scoot to EOB.  BP's soft but asymptomatic.  Tolerated sitting EOB x 5 min in preparation for standing. General transfer comment: first attempted sit to stand + 2 side by side assist.  Pt unable to tolerated no more that TTWB R LE due to pain and tolerated < 30 seconds partial upright time.  So assisted back to EOB.  Second attempt use "Bear Hug" SPS 1/4 turn towards pt's L from elevated bed to recliner.  Pt required 75% assist to complete pivot.  Rec nursing use MAXI SKY for back to bed.General Gait Details: transfers only this session due to inability to tolerate WBing thru R LE due to pain R thigh.  Follow Up Recommendations  SNF     Equipment Recommendations  Wheelchair cushion (measurements PT);Wheelchair (measurements PT)    Recommendations for Other Services       Precautions / Restrictions Precautions Precautions: Fall Precaution  Comments: wound vac right leg and left arm, does not want right leg touched; CVP, A-line Rt wrist    Mobility  Bed Mobility Overal bed mobility: Needs Assistance Bed Mobility: Supine to Sit     Supine to sit: Mod assist     General bed mobility comments: pt able to slide his his legs to EOB but did require Mod Assist for upper body support.  Required increased time to scoot to EOB.  BP's soft but asymptomatic.  Tolerated sitting EOB x 5 min in preparation for standing.    Transfers Overall transfer level: Needs assistance Equipment used: Standard walker Transfers: Sit to/from [employer] Transfers Sit to Stand: Mod assist;+2 physical assistance;+2 safety/equipment;From elevated surface Stand pivot transfers: Max assist       General transfer comment: first attempted sit to stand + 2 side by side assist.  Pt unable to tolerated no more that TTWB R LE due to pain and tolerated < 30 seconds partial upright time.  So assisted back to EOB.  Second attempt use "Bear Hug" SPS 1/4 turn towards pt's L from elevated bed to recliner.  Pt required 75% assist to complete pivot.  Rec nursing use MAXI SKY for back to bed.  Ambulation/Gait             General Gait Details: transfers only this session due to inability to tolerate WBing thru R LE due to pain R thigh.   Stairs  Wheelchair Mobility    Modified Rankin (Stroke Patients Only)       Balance                                            Cognition Arousal/Alertness: Awake/alert Behavior During Therapy: Anxious;Impulsive                                   General Comments: AxO x 3 required increased time "now talk me through this" and "don't touch my leg". otherwise cooperative      Exercises      General Comments        Pertinent Vitals/Pain Pain Assessment: Faces Faces Pain Scale: Hurts even more Pain Location: right thigh Pain Descriptors / Indicators:  Grimacing;Cramping Pain Intervention(s): Monitored during session;Repositioned    Home Living                      Prior Function            PT Goals (current goals can now be found in the care plan section) Progress towards PT goals: Progressing toward goals    Frequency    Min 2X/week      PT Plan Current plan remains appropriate    Co-evaluation              AM-PAC PT "6 Clicks" Mobility   Outcome Measure  Help needed turning from your back to your side while in a flat bed without using bedrails?: A Lot Help needed moving from lying on your back to sitting on the side of a flat bed without using bedrails?: A Lot Help needed moving to and from a bed to a chair (including a wheelchair)?: Total Help needed standing up from a chair using your arms (e.g., wheelchair or bedside chair)?: Total Help needed to walk in hospital room?: Total Help needed climbing 3-5 steps with a railing? : Total 6 Click Score: 8    End of Session Equipment Utilized During Treatment: Gait belt Activity Tolerance: Patient limited by pain;Patient tolerated treatment well Patient left: in chair;with call bell/phone within reach Nurse Communication: Mobility status PT Visit Diagnosis: Unsteadiness on feet (R26.81);Pain;Difficulty in walking, not elsewhere classified (R26.2) Pain - Right/Left: Right Pain - part of body: Leg     Time: 1355-1439 PT Time Calculation (min) (ACUTE ONLY): 44 min  Charges:  $Gait Training: 8-22 mins $Therapeutic Activity: 23-37 mins                     Felecia Shelling  PTA Acute  Rehabilitation Services Pager      [phone] Office      [phone]

## 2020-10-31 NOTE — Progress Notes (Signed)
NAME:  Ronald Brady, MRN:  [id number], DOB:  Jun 20, 1961, LOS: 9 ADMISSION DATE:  11/11/2020, CONSULTATION DATE:  4/3 REFERRING MD:  [facility], CHIEF COMPLAINT:  sepsis   History of Present Illness:  60 y/o M, smoker, with poorly controlled DM who presented to [facility] 4/2 after being found at home in a chair covered in urine & feces.  Seen with multiple wounds   Pertinent  Medical History  Tobacco Abuse - began smoking at 4 Worked as a logger  Poorly controlled DM - Hgb A1c 11.8 in 07/2019  HTN   Significant Hospital Events: Including procedures, antibiotic start and stop dates in addition to other pertinent events    4/02 presented to ER in shock due to multiple abscess (right thigh, left wrist), DKA. CT femur found elongated gas and fluid collection in the right hamstring c/w abscess. No evidence of osteomyelitis. Treated with clinda, meropenem, cultured, central line placed. I&D in ER.   4/03 Wrist CT with gas throughout the SQ tissues in the anterior distal forearm. Ortho consulted > taken to OR for debridement of right posterior hamstring and left forearm. Intraoperative cultures obtained. Cultures growing staph aureus (sensitivities pending). Vanco added but abx narrowed to cefazolin late pm  4/04 Ortho note reflects concern for poor wound healing, may need hip disarticulation. On levophed 1-2 mcg, BCID with staph aureus, UC 100k staph aureus.  ECHO 60-65% EF, no evidence of vegetation mentioned  4/5 CT Femur w/o contrast with multilocular fluid collection in the lower hamstrings muscles at the level of the mid to distal femur with foci of SQ emphysema  4/6 GIB overnight, Hgb down to 5.9, tx 2 units PRBC's, 1 unit FFP, PPI gtt initiated + IVF.  Follow up Hgb 7.3, remains on low dose pressors. GI consulted.   4/8 Repeat I&D of left wrist and right thigh, extensive purulent material with new abscess seen near pelvis   4/9 weaning pressors, decreased meal time insulin  4/10  intermittent hypotension, stop diuresis  4/11 on pressors, MAP above goal, wean  Interim History / Subjective:  No acute issues overnight  On NE  Objective   Blood pressure (!) 147/62, pulse (!) 109, temperature 97.6 F (36.4 C), temperature source Axillary, resp. rate (!) 28, height 5\' 5"  (1.651 m), weight 75.7 kg, SpO2 100 %. CVP:  [3 mmHg] 3 mmHg  FiO2 (%):  [21 %] 21 %   Intake/Output Summary (Last 24 hours) at 10/31/2020 0929 Last data filed at 10/31/2020 0700 Gross per 24 hour  Intake 849.23 ml  Output 1420 ml  Net -570.77 ml   Filed Weights   11/13/2020 1510 10/24/20 0515 11/10/2020 1740  Weight: 70 kg 75.7 kg 75.7 kg    Examination: General: Chronically ill appearing deconditioned middle aged male lying in bed in NAD CV: s1s2 regular rate and rhythm, no murmur, rubs, or gallops,  PULM:  Clear to ascultation, no added breath sounds, no increased work of breathing GI: soft, bowel sounds active in all 4 quadrants, non-tender, non-distended, tolerating diet, no further signs of GIB Extremities: warm/dry, no edema  Skin: no rashes or lesions  Labs/imaging that I have   4/2 blood cultures > Positive for Staph aureus   4/2 CT femur > Elongated gas and fluid collection noted along the surface of the hamstring muscles, in some areas extending deep into the hamstring muscles compatible with intramuscular abscess. This extends over an extensive area in length extending from the soft tissue defect in the  proximal posteromedial right thigh distally to just above the knee.  4/3 wound culture > Positive Staph aureus   4/3 CT wrist > Edema and locules of gas throughout the subcutaneous soft tissues in the anterior distal forearm. No well-defined focal abscess. This could be further evaluated with ultrasound if felt clinically indicated.  4/5 CT femur > There remains a multilocular fluid collection within the lower hamstrings muscles at the level of the mid to distal femur with foci of  subcutaneous emphysema measuring 5.4 x 3.7 x 3 x 6.0 cm.  4/6 CTA ABD/Pelvis > No evidence for active arterial GI bleeding. Diffuse gastric wall thickening and duodenal wall thickening, suggestive of gastritis and duodenitis. Incidentally noted small bowel malrotation. Small bilateral pleural effusions with adjacent atelectasis.  Resolved Hospital Problem list     Assessment & Plan:   Septic Shock secondary MSSA Abscess of R Thigh, L Wrist with MSSA, MSSA bacteremia,  component of hypovolemia -TTE w/o evidence of endocarditis -TEE needed P: ID and Ortho following, appreciate assistance Midodrine TID Remains on low dose Levo, weaning MAP goal > 65 Continue IV Cefazolin Pain control  Continue wound vacs   Poorly Controlled DM with Hyperglycemia  -Hyperglycemia on admit with glucose >900, small ketones in urine.  Hgb A1c 12.9 -BG better with insulin P: Continue SSI, Lantus, and meal coverage - decreased meal coverage with hypoglycemia overnight 4/9 Discontinue home metformin - refusing  GIB, improved  -BRBPR, suspect diverticular bleed  -CTA ABD/pevis with no arterial bleed  P: GI assess and have now signed off as of 4/7 Conservative measures, due to critical illness Supportive care  Hgb stable PPI  Hypokalemia, in setting of lasix, poor PO intake P: Trend Bmet  Supplement as needed   Anemia  P: Trend CBC  Transfuse per protocol Hgb goal greater than 7  Active Smoker, Suspected COPD / Asthmatic Bronchitis P: Continue BDs Encourage pulmonary hygiene  Smoking cessation encouraged   Best practice   Diet:  Oral Pain/Anxiety/Delirium protocol (if indicated): No VAP protocol (if indicated): Not indicated DVT prophylaxis: SCD given recent GI bleed and need for pressors GI prophylaxis: PPI Glucose control:  SSI Yes and Basal insulin Yes Central venous access:  Yes, and it is still needed Arterial line:  N/A Foley:  Yes, and it is still needed Mobility:  OOB  PT  consulted: N/A Last date of multidisciplinary goals of care discussion n/a Code Status:  full code Disposition: ICU    CRITICAL CARE Performed by: Lesia Sago Shambhavi Salley  Total critical care time: 35 minutes  Critical care time was exclusive of separately billable procedures and treating other patients.  Critical care was necessary to treat or prevent imminent or life-threatening deterioration.  Critical care was time spent personally by me on the following activities: development of treatment plan with patient and/or surrogate as well as nursing, discussions with consultants, evaluation of patient's response to treatment, examination of patient, obtaining history from patient or surrogate, ordering and performing treatments and interventions, ordering and review of laboratory studies, ordering and review of radiographic studies, pulse oximetry and re-evaluation of patient's condition.  Karren Burly, MD [facility] Personal contact information can be found on AMION, please see AMION for correct PCCM contact after 7p 10/31/2020, 9:29 AM

## 2020-10-31 NOTE — Consult Note (Addendum)
WOC Nurse wound follow up (proximal) Wound type: Full thickness, infectious Measurement: 4.5cm x 2cm x 2.2cm Wound bed: Red, moist Drainage (amount, consistency, odor) serous Periwound: intact Dressing procedure/placement/frequency: 1 piece of black foam removed from wound. 1 piece of black foam used to fill wound. Drape applied to protect periwound skin and secure foam.  Attached to continuous negative pressure and an immediate seal is achieved.   WOC Nurse wound follow up  (distal) Wound type:Full thickness, infectious Measurement:3.5cm x 2cm x 1cm Wound bed: red, moist Drainage (amount, consistency, odor) Purulent in a moderate to large amount. Periwound: Intact Dressing procedure/placement/frequency: When dressing was removed, a large amount of purulent drainage poured from defect.  Periwound skin cleansed and gentle pressure applied.  No more purulent drainage was expressed.  Wound cleansed and filled with 1 piece of black foam.  Drape applied to protect periwound skin and secure foam.  Attached to continuous negative pressure and an immediate seal is achieved.   WOC Nurse Consult Note: Reason for Consult: Pressure injury, Stage 3, left buttock at gluteal cleft Wound type:Pressure plus moisture (fecal incontinence) Pressure Injury POA: No Measurement:3cm x 1.5cm x 0.2cm  Wound bed: pink, moist, with scattered areas of yellow slough Drainage (amount, consistency, odor) small serous Periwound: intact, moist Dressing procedure/placement/frequency: Will implement barrier cream to the area as it is continually assaulted by liquid fecal incontinence.  Next NPWT dressing change is Wednesday, 10/26/2020.  Supplies ordered.  I am assisted in care today by both a NT Victorino Dike) and the patient's Bedside RN Asher Muir). The patient had a very large (>1045ml) liquid black stool prior to dressing change and required the assistance of 3 persons to clean, reposition and change the 2  dressings.   WOC nursing team will follow, and will remain available to this patient, the nursing and medical teams.   Thanks, Ladona Mow, MSN, RN, GNP, [place]  Pager# [phone]

## 2020-10-31 NOTE — Progress Notes (Signed)
   Subjective: 4 Days Post-Op Procedure(s) (LRB): IRRIGATION AND DEBRIDEMENT DISTAL THIGH WITH WOUND VAC REMOVAL & APPLICATION (Right) REAPPLICATION OF LEFT WRIST DRESSING, PACKED (Left) Patient reports pain as moderate.  Stool incontinence.   Objective: Vital signs in last 24 hours: Temp:  [98 F (36.7 C)-98.4 F (36.9 C)] 98 F (36.7 C) (04/11 0007) Pulse Rate:  [89-135] 97 (04/11 0730) Resp:  [12-24] 20 (04/11 0730) BP: (73-157)/(43-89) 157/65 (04/11 0730) SpO2:  [93 %-100 %] 98 % (04/11 0730) FiO2 (%):  [21 %] 21 % (04/10 1941)  Intake/Output from previous day: 04/10 0701 - 04/11 0700 In: 1141 [P.O.:340; I.V.:527.1; IV Piggyback:273.9] Out: 1420 [Urine:1250; Drains:170] Intake/Output this shift: No intake/output data recorded.  Recent Labs    10/29/20 0300  HGB 7.8*   Recent Labs    10/29/20 0300  WBC 6.3  RBC 2.65*  HCT 23.8*  PLT 121*   Recent Labs    10/29/20 0300 10/30/20 0606  NA 137 135  K 2.8* 4.1  CL 104 104  CO2 26 26  BUN 21* 26*  CREATININE 0.62 0.64  GLUCOSE 127* 179*  CALCIUM 6.8* 6.7*   No results for input(s): LABPT, INR in the last 72 hours.  drainage distal medial hamstring . both VAC's continued drainage.  No results found.  Assessment/Plan: 4 Days Post-Op Procedure(s) (LRB): IRRIGATION AND DEBRIDEMENT DISTAL THIGH WITH WOUND VAC REMOVAL & APPLICATION (Right) REAPPLICATION OF LEFT WRIST DRESSING, PACKED (Left) Plan: continue IV ABX , nutritional support VAC's.   Eldred Manges 10/31/2020, 8:07 AM

## 2020-10-31 NOTE — Progress Notes (Signed)
[facility]  Date of Admission:  11/16/2020           Reason for visit: Follow up on MSSA bacteremia  Current antibiotics: Day 9 cefazolin (4/3--present)   ASSESSMENT:    MSSA bacteremia in the setting of right lower extremity and left upper extremity abscesses: Status post I&D 4/3 and 10/25/2020 with orthopedic surgery.  Repeat blood cultures negative from 10/24/2020 and TTE without evidence of vegetation.  TEE pending. Septic shock: Secondary to above Type 2 diabetes GI bleed: Suspected diverticular bleed.  GI consulted and signed off  PLAN:    Continue cefazolin 2 g every 8 hours Needs TEE Vasopressors per critical care Glycemic control, wound care   Principal Problem:   Severe sepsis with septic shock (HCC) Active Problems:   Abscess of right thigh   Abscess of left forearm   Diabetes mellitus (HCC)   Hyperosmolar hyperglycemic state (HHS) (HCC)   AKI (acute kidney injury) (HCC)   Acute lower UTI   Emphysema lung (HCC)   Abscess of right lower extremity   MSSA bacteremia   Pressure injury of skin   Malnutrition of moderate degree    MEDICATIONS:    Scheduled Meds: . (feeding supplement) PROSource Plus  30 mL Oral BID BM  . chlorhexidine  15 mL Mouth Rinse BID  . Chlorhexidine Gluconate Cloth  6 each Topical Daily  . feeding supplement (GLUCERNA SHAKE)  237 mL Oral BID BM  . insulin aspart  0-5 Units Subcutaneous QHS  . insulin aspart  0-9 Units Subcutaneous TID WC  . insulin aspart  2 Units Subcutaneous TID WC  . insulin glargine  10 Units Subcutaneous QHS  . ipratropium-albuterol  3 mL Nebulization BID  . mouth rinse  15 mL Mouth Rinse q12n4p  . metFORMIN  1,000 mg Oral BID WC  . midodrine  10 mg Oral TID WC  . mometasone-formoterol  2 puff Inhalation BID  . multivitamin with minerals  1 tablet Oral Daily  . nicotine  21 mg Transdermal Daily  . pantoprazole  40 mg Intravenous Q12H  . sodium chloride flush  10-40 mL  Intracatheter Q12H  . sodium chloride flush  10-40 mL Intracatheter Q12H   Continuous Infusions: .  ceFAZolin (ANCEF) IV Stopped (10/31/20 9449)  . norepinephrine (LEVOPHED) Adult infusion 6 mcg/min (10/31/20 0700)   PRN Meds:.albuterol, ALPRAZolam, bisacodyl, dextrose, metoCLOPramide **OR** metoCLOPramide (REGLAN) injection, morphine injection, ondansetron **OR** ondansetron (ZOFRAN) IV, oxyCODONE-acetaminophen, phenol, sodium chloride flush, sodium chloride flush  SUBJECTIVE:   24 hour events:  No acute events noted overnight Afebrile, T-max 97.6 Continues on cefazolin Repeat blood cultures 4/4 no growth to date final   Reports that he is doing okay today.  Discussed the need for TEE which she is agreeable to  Review of Systems  Constitutional: Negative for chills and fever.  Respiratory: Negative.   Cardiovascular: Negative.   Gastrointestinal: Negative.   Musculoskeletal: Negative.       OBJECTIVE:   Blood pressure (!) 147/62, pulse (!) 109, temperature 97.6 F (36.4 C), temperature source Axillary, resp. rate (!) 28, height 5\' 5"  (1.651 m), weight 75.7 kg, SpO2 100 %. Body mass index is 27.77 kg/m.  Physical Exam Constitutional:      General: He is not in acute distress.    Appearance: Normal appearance.  HENT:     Head: Normocephalic and atraumatic.  Eyes:     Extraocular Movements: Extraocular movements intact.     Conjunctiva/sclera:  Conjunctivae normal.  Pulmonary:     Effort: Pulmonary effort is normal. No respiratory distress.  Musculoskeletal:     Comments: Left wrist wrapped Right upper extremity PICC line in place  Skin:    General: Skin is warm and dry.  Neurological:     General: No focal deficit present.     Mental Status: He is alert and oriented to person, place, and time.  Psychiatric:        Mood and Affect: Mood normal.        Behavior: Behavior normal.      Lab Results: Lab Results  Component Value Date   WBC 6.3 10/29/2020    HGB 7.8 (L) 10/29/2020   HCT 23.8 (L) 10/29/2020   MCV 89.8 10/29/2020   PLT 121 (L) 10/29/2020    Lab Results  Component Value Date   NA 135 10/30/2020   K 4.1 10/30/2020   CO2 26 10/30/2020   GLUCOSE 179 (H) 10/30/2020   BUN 26 (H) 10/30/2020   CREATININE 0.64 10/30/2020   CALCIUM 6.7 (L) 10/30/2020   GFRNONAA >60 10/30/2020   GFRAA >60 08/21/2019    Lab Results  Component Value Date   ALT 7 10/26/2020   AST 11 (L) 10/26/2020   ALKPHOS 107 10/26/2020   BILITOT 0.7 10/26/2020       Component Value Date/Time   CRP 9.0 (H) 10/29/2020 0300    No results found for: ESRSEDRATE   I have reviewed the micro and lab results in Epic.  Imaging: No results found.      Vedia Coffer for Infectious Disease [facility] [phone] pager 10/31/2020, 9:07 AM  I spent greater than 35 minutes with the patient including greater than 50% of time in face to face counsel of the patient and in coordination of their care.

## 2020-10-31 NOTE — Progress Notes (Signed)
With VAC change distal thigh still with significant purulent drainage. Will take back Tuesday 5 PM for repeat surgery, will change VAC and likely open area over distal medial knee and PES bursa .  THIS MAY NEED A 3rd VAC.   

## 2020-10-31 NOTE — Progress Notes (Signed)
PHARMACY NOTE -  Ancef  Pharmacy has been assisting with dosing of cefazolin for bacteremia.  Dosage remains stable at 2g IV q8 hr and further renal adjustments per institutional Pharmacy antibiotic protocol  Pharmacy will sign off, following peripherally for culture results or dose adjustments. Please reconsult if a change in clinical status warrants re-evaluation of dosage.  Bernadene Person, PharmD, BCPS [phone] 10/31/2020, 8:31 AM

## 2020-10-31 NOTE — TOC Progression Note (Signed)
Transition of Care [facility]) - Progression Note    Patient Details  Name: Ronald Brady MRN: [id number] Date of Birth: 1961/05/14  Transition of Care [facility]) CM/SW Contact  Golda Acre, RN Phone Number: 10/31/2020, 7:47 AM  Clinical Narrative:      Significant Hospital Events: Including procedures, antibiotic start and stop dates in addition to other pertinent events    4/02 presented to ER in shock due to multiple abscess (right thigh, left wrist), DKA. CT femur found elongated gas and fluid collection in the right hamstring c/w abscess. No evidence of osteomyelitis. Treated with clinda, meropenem, cultured, central line placed. I&D in ER.   4/03 Wrist CT with gas throughout the SQ tissues in the anterior distal forearm. Ortho consulted > taken to OR for debridement of right posterior hamstring and left forearm. Intraoperative cultures obtained. Cultures growing staph aureus (sensitivities pending). Vanco added but abx narrowed to cefazolin late pm  4/04 Ortho note reflects concern for poor wound healing, may need hip disarticulation. On levophed 1-2 mcg, BCID with staph aureus, UC 100k staph aureus.  ECHO 60-65% EF, no evidence of vegetation mentioned  4/5 CT Femur w/o contrast with multilocular fluid collection in the lower hamstrings muscles at the level of the mid to distal femur with foci of SQ emphysema  4/6 GIB overnight, Hgb down to 5.9, tx 2 units PRBC's, 1 unit FFP, PPI gtt initiated + IVF.  Follow up Hgb 7.3, remains on low dose pressors. GI consulted.   4/8 Repeat I&D of left wrist and right thigh, extensive purulent material with new abscess seen near pelvis   4/9 weaning pressors, decreased meal time insulin  4/10 intermittent hypotension, stop diuresis  PLAN: unable to determine at this time due to recent GIB,and ortho problems stated above.  Expected Discharge Plan: Home w Home Health Services Barriers to Discharge: Continued Medical Work up  Expected  Discharge Plan and Services Expected Discharge Plan: Home w Home Health Services   Discharge Planning Services: CM Consult   Living arrangements for the past 2 months: Single Family Home                                       Social Determinants of Health (SDOH) Interventions    Readmission Risk Interventions No flowsheet data found.

## 2020-10-31 NOTE — H&P (View-Only) (Signed)
With VAC change distal thigh still with significant purulent drainage. Will take back Tuesday 5 PM for repeat surgery, will change VAC and likely open area over distal medial knee and PES bursa .  THIS MAY NEED A 3rd VAC.

## 2020-11-01 ENCOUNTER — Inpatient Hospital Stay (HOSPITAL_COMMUNITY): Payer: Medicaid Other | Admitting: Certified Registered Nurse Anesthetist

## 2020-11-01 ENCOUNTER — Encounter (HOSPITAL_COMMUNITY): Admission: EM | Disposition: E | Payer: Self-pay | Source: Home / Self Care | Attending: Pulmonary Disease

## 2020-11-01 DIAGNOSIS — R6521 Severe sepsis with septic shock: Secondary | ICD-10-CM | POA: Diagnosis not present

## 2020-11-01 DIAGNOSIS — A4101 Sepsis due to Methicillin susceptible Staphylococcus aureus: Secondary | ICD-10-CM | POA: Diagnosis not present

## 2020-11-01 DIAGNOSIS — L02415 Cutaneous abscess of right lower limb: Secondary | ICD-10-CM

## 2020-11-01 DIAGNOSIS — L02414 Cutaneous abscess of left upper limb: Secondary | ICD-10-CM | POA: Diagnosis not present

## 2020-11-01 DIAGNOSIS — E1169 Type 2 diabetes mellitus with other specified complication: Secondary | ICD-10-CM | POA: Diagnosis not present

## 2020-11-01 DIAGNOSIS — A419 Sepsis, unspecified organism: Secondary | ICD-10-CM | POA: Diagnosis not present

## 2020-11-01 HISTORY — PX: I & D EXTREMITY: SHX5045

## 2020-11-01 HISTORY — PX: DRESSING CHANGE UNDER ANESTHESIA: SHX5237

## 2020-11-01 LAB — COMPREHENSIVE METABOLIC PANEL
ALT: <5 U/L (ref 0–44)
AST: 11 U/L — ABNORMAL LOW (ref 15–41)
Albumin: 1.2 g/dL — ABNORMAL LOW (ref 3.5–5.0)
Alkaline Phosphatase: 109 U/L (ref 38–126)
Anion gap: 6 (ref 5–15)
BUN: 25 mg/dL — ABNORMAL HIGH (ref 6–20)
CO2: 25 mmol/L (ref 22–32)
Calcium: 7.5 mg/dL — ABNORMAL LOW (ref 8.9–10.3)
Chloride: 108 mmol/L (ref 98–111)
Creatinine, Ser: 0.62 mg/dL (ref 0.61–1.24)
GFR, Estimated: >60 mL/min (ref >60)
Glucose, Bld: 124 mg/dL — ABNORMAL HIGH (ref 70–99)
Potassium: 3.1 mmol/L — ABNORMAL LOW (ref 3.5–5.1)
Sodium: 139 mmol/L (ref 135–145)
Total Bilirubin: <0.1 mg/dL — ABNORMAL LOW (ref 0.3–1.2)
Total Protein: 4.4 g/dL — ABNORMAL LOW (ref 6.5–8.1)

## 2020-11-01 LAB — PREPARE RBC (CROSSMATCH)

## 2020-11-01 LAB — CBC WITH DIFFERENTIAL/PLATELET
Abs Immature Granulocytes: 0.03 10*3/uL (ref 0.00–0.07)
Basophils Absolute: 0 10*3/uL (ref 0.0–0.1)
Basophils Relative: 0 %
Eosinophils Absolute: 0 10*3/uL (ref 0.0–0.5)
Eosinophils Relative: 0 %
HCT: 14.8 % — ABNORMAL LOW (ref 39.0–52.0)
Hemoglobin: 4.6 g/dL — CL (ref 13.0–17.0)
Immature Granulocytes: 1 %
Lymphocytes Relative: 34 %
Lymphs Abs: 1.7 10*3/uL (ref 0.7–4.0)
MCH: 28.8 pg (ref 26.0–34.0)
MCHC: 31.1 g/dL (ref 30.0–36.0)
MCV: 92.5 fL (ref 80.0–100.0)
Monocytes Absolute: 0.2 10*3/uL (ref 0.1–1.0)
Monocytes Relative: 5 %
Neutro Abs: 3.1 10*3/uL (ref 1.7–7.7)
Neutrophils Relative %: 60 %
Platelets: 169 10*3/uL (ref 150–400)
RBC: 1.6 MIL/uL — ABNORMAL LOW (ref 4.22–5.81)
RDW: 16.3 % — ABNORMAL HIGH (ref 11.5–15.5)
WBC: 5.1 10*3/uL (ref 4.0–10.5)
nRBC: 0 % (ref 0.0–0.2)

## 2020-11-01 LAB — GLUCOSE, CAPILLARY
Glucose-Capillary: 163 mg/dL — ABNORMAL HIGH (ref 70–99)
Glucose-Capillary: 81 mg/dL (ref 70–99)
Glucose-Capillary: 84 mg/dL (ref 70–99)
Glucose-Capillary: 82 mg/dL (ref 70–99)
Glucose-Capillary: 117 mg/dL — ABNORMAL HIGH (ref 70–99)
Glucose-Capillary: 146 mg/dL — ABNORMAL HIGH (ref 70–99)

## 2020-11-01 LAB — CBC
HCT: 22.2 % — ABNORMAL LOW (ref 39.0–52.0)
Hemoglobin: 7.2 g/dL — ABNORMAL LOW (ref 13.0–17.0)
MCH: 28.8 pg (ref 26.0–34.0)
MCHC: 32.4 g/dL (ref 30.0–36.0)
MCV: 88.8 fL (ref 80.0–100.0)
Platelets: 198 10*3/uL (ref 150–400)
RBC: 2.5 MIL/uL — ABNORMAL LOW (ref 4.22–5.81)
RDW: 16.2 % — ABNORMAL HIGH (ref 11.5–15.5)
WBC: 6 10*3/uL (ref 4.0–10.5)
nRBC: 0 % (ref 0.0–0.2)

## 2020-11-01 LAB — HEMOGLOBIN AND HEMATOCRIT, BLOOD
HCT: 12.4 % — ABNORMAL LOW (ref 39.0–52.0)
Hemoglobin: 3.8 g/dL — CL (ref 13.0–17.0)

## 2020-11-01 LAB — C-REACTIVE PROTEIN: CRP: 4.3 mg/dL — ABNORMAL HIGH (ref <1.0)

## 2020-11-01 SURGERY — IRRIGATION AND DEBRIDEMENT EXTREMITY
Anesthesia: General | Laterality: Right

## 2020-11-01 MED ORDER — SODIUM CHLORIDE 0.9 % IR SOLN
Status: DC | PRN
  Administered 2020-11-01: 1000 mL

## 2020-11-01 MED ORDER — PHENYLEPHRINE HCL (PRESSORS) 10 MG/ML IV SOLN
INTRAVENOUS | Status: AC
  Filled 2020-11-01: qty 1

## 2020-11-01 MED ORDER — MIDAZOLAM HCL 2 MG/2ML IJ SOLN
INTRAMUSCULAR | Status: AC
  Filled 2020-11-01: qty 2

## 2020-11-01 MED ORDER — SODIUM CHLORIDE 0.9% IV SOLUTION: Freq: Once | INTRAVENOUS | Status: AC

## 2020-11-01 MED ORDER — SUCCINYLCHOLINE CHLORIDE 200 MG/10ML IV SOSY
PREFILLED_SYRINGE | INTRAVENOUS | Status: DC | PRN
  Administered 2020-11-01: 100 mg via INTRAVENOUS

## 2020-11-01 MED ORDER — PROPOFOL 10 MG/ML IV BOLUS
INTRAVENOUS | Status: DC | PRN
  Administered 2020-11-01: 100 mg via INTRAVENOUS

## 2020-11-01 MED ORDER — LACTATED RINGERS IV SOLN: INTRAVENOUS | Status: DC

## 2020-11-01 MED ORDER — INSULIN ASPART 100 UNIT/ML ~~LOC~~ SOLN
6.0000 [IU] | Freq: Three times a day (TID) | SUBCUTANEOUS | Status: DC
  Administered 2020-11-02 (×2): 6 [IU] via SUBCUTANEOUS

## 2020-11-01 MED ORDER — SODIUM CHLORIDE 0.9% IV SOLUTION
Freq: Once | INTRAVENOUS | Status: AC
Start: 2020-11-01 — End: 2020-11-01
  Administered 2020-11-01: 500 mL via INTRAVENOUS

## 2020-11-01 MED ORDER — INSULIN GLARGINE 100 UNIT/ML ~~LOC~~ SOLN
20.0000 [IU] | Freq: Every day | SUBCUTANEOUS | Status: DC
  Filled 2020-11-01: qty 0.2

## 2020-11-01 MED ORDER — INSULIN ASPART 100 UNIT/ML ~~LOC~~ SOLN
0.0000 [IU] | Freq: Three times a day (TID) | SUBCUTANEOUS | Status: DC
  Administered 2020-11-02: 2 [IU] via SUBCUTANEOUS
  Administered 2020-11-02: 5 [IU] via SUBCUTANEOUS
  Administered 2020-11-02: 8 [IU] via SUBCUTANEOUS

## 2020-11-01 MED ORDER — SUGAMMADEX SODIUM 200 MG/2ML IV SOLN
INTRAVENOUS | Status: DC | PRN
  Administered 2020-11-01: 151.4 mg via INTRAVENOUS

## 2020-11-01 MED ORDER — POTASSIUM CHLORIDE 10 MEQ/50ML IV SOLN
10.0000 meq | INTRAVENOUS | Status: AC
  Administered 2020-11-01 (×6): 10 meq via INTRAVENOUS
  Filled 2020-11-01 (×6): qty 50

## 2020-11-01 MED ORDER — LIDOCAINE 2% (20 MG/ML) 5 ML SYRINGE
INTRAMUSCULAR | Status: DC | PRN
  Administered 2020-11-01: 100 mg via INTRAVENOUS

## 2020-11-01 MED ORDER — PHENYLEPHRINE 40 MCG/ML (10ML) SYRINGE FOR IV PUSH (FOR BLOOD PRESSURE SUPPORT)
PREFILLED_SYRINGE | INTRAVENOUS | Status: AC
  Filled 2020-11-01: qty 10

## 2020-11-01 MED ORDER — VASOPRESSIN 20 UNITS/100 ML INFUSION FOR SHOCK
0.0000 [IU]/min | INTRAVENOUS | Status: AC
Start: 2020-11-02 — End: 2020-11-01
  Administered 2020-11-01: 0.03 [IU]/min via INTRAVENOUS
  Filled 2020-11-01: qty 100

## 2020-11-01 MED ORDER — MIDAZOLAM HCL 2 MG/2ML IJ SOLN
INTRAMUSCULAR | Status: AC
  Administered 2020-11-01: 2 mg
  Filled 2020-11-01: qty 2

## 2020-11-01 MED ORDER — PROPOFOL 10 MG/ML IV BOLUS
INTRAVENOUS | Status: AC
  Filled 2020-11-01: qty 20

## 2020-11-01 MED ORDER — ONDANSETRON HCL 4 MG/2ML IJ SOLN
INTRAMUSCULAR | Status: DC | PRN
  Administered 2020-11-01: 4 mg via INTRAVENOUS

## 2020-11-01 MED ORDER — VASOPRESSIN 20 UNIT/ML IV SOLN
0.0000 [IU]/min | INTRAVENOUS | Status: DC
  Administered 2020-11-02: 0.03 [IU]/min via INTRAVENOUS
  Filled 2020-11-01 (×2): qty 2.5

## 2020-11-01 MED ORDER — FENTANYL CITRATE (PF) 100 MCG/2ML IJ SOLN
INTRAMUSCULAR | Status: AC
  Filled 2020-11-01: qty 2

## 2020-11-01 MED ORDER — STERILE WATER FOR INJECTION IJ SOLN
INTRAMUSCULAR | Status: AC
  Filled 2020-11-01: qty 10

## 2020-11-01 MED ORDER — MIDAZOLAM HCL 2 MG/2ML IJ SOLN
INTRAMUSCULAR | Status: AC
  Administered 2020-11-01: 2 mg
  Filled 2020-11-01: qty 6

## 2020-11-01 MED ORDER — ROCURONIUM BROMIDE 10 MG/ML (PF) SYRINGE
PREFILLED_SYRINGE | INTRAVENOUS | Status: DC | PRN
  Administered 2020-11-01: 20 mg via INTRAVENOUS

## 2020-11-01 MED ORDER — VASOPRESSIN 20 UNIT/ML IV SOLN
0.0000 [IU]/min | INTRAVENOUS | Status: DC
  Filled 2020-11-01: qty 2.5

## 2020-11-01 MED ORDER — PHENYLEPHRINE 40 MCG/ML (10ML) SYRINGE FOR IV PUSH (FOR BLOOD PRESSURE SUPPORT)
PREFILLED_SYRINGE | INTRAVENOUS | Status: AC
  Filled 2020-11-01: qty 20

## 2020-11-01 MED ORDER — POVIDONE-IODINE 10 % EX SWAB
2.0000 "application " | Freq: Once | CUTANEOUS | Status: AC
  Administered 2020-11-01: 2 via TOPICAL

## 2020-11-01 MED ORDER — FENTANYL CITRATE (PF) 100 MCG/2ML IJ SOLN
INTRAMUSCULAR | Status: DC | PRN
  Administered 2020-11-01 (×3): 50 ug via INTRAVENOUS

## 2020-11-01 MED ORDER — LIDOCAINE HCL (CARDIAC) PF 100 MG/5ML IV SOSY
PREFILLED_SYRINGE | INTRAVENOUS | Status: AC
  Filled 2020-11-01: qty 5

## 2020-11-01 MED ORDER — PHENYLEPHRINE 40 MCG/ML (10ML) SYRINGE FOR IV PUSH (FOR BLOOD PRESSURE SUPPORT)
PREFILLED_SYRINGE | INTRAVENOUS | Status: DC | PRN
  Administered 2020-11-01 (×4): 120 ug via INTRAVENOUS

## 2020-11-01 MED ORDER — OXYCODONE HCL 5 MG PO TABS: 5.0000 mg | ORAL_TABLET | Freq: Once | ORAL | Status: DC | PRN

## 2020-11-01 MED ORDER — PROPOFOL 500 MG/50ML IV EMUL
INTRAVENOUS | Status: AC
  Filled 2020-11-01: qty 50

## 2020-11-01 MED ORDER — OXYCODONE HCL 5 MG/5ML PO SOLN
5.0000 mg | Freq: Once | ORAL | Status: DC | PRN
Start: 2020-11-01 — End: 2020-11-01

## 2020-11-01 MED ORDER — ROCURONIUM BROMIDE 10 MG/ML (PF) SYRINGE
PREFILLED_SYRINGE | INTRAVENOUS | Status: AC
  Filled 2020-11-01: qty 10

## 2020-11-01 MED ORDER — ONDANSETRON HCL 4 MG/2ML IJ SOLN: 4.0000 mg | Freq: Four times a day (QID) | INTRAMUSCULAR | Status: DC | PRN

## 2020-11-01 MED ORDER — FENTANYL CITRATE (PF) 250 MCG/5ML IJ SOLN
INTRAMUSCULAR | Status: AC
  Filled 2020-11-01: qty 5

## 2020-11-01 MED ORDER — LACTATED RINGERS IV SOLN: INTRAVENOUS | Status: DC | PRN

## 2020-11-01 MED ORDER — FENTANYL CITRATE (PF) 100 MCG/2ML IJ SOLN: 25.0000 ug | INTRAMUSCULAR | Status: DC | PRN

## 2020-11-01 MED ORDER — MIDAZOLAM HCL 5 MG/5ML IJ SOLN
INTRAMUSCULAR | Status: DC | PRN
  Administered 2020-11-01: 2 mg via INTRAVENOUS

## 2020-11-01 MED ORDER — SUCCINYLCHOLINE CHLORIDE 200 MG/10ML IV SOSY
PREFILLED_SYRINGE | INTRAVENOUS | Status: AC
  Filled 2020-11-01: qty 10

## 2020-11-01 MED ORDER — SODIUM CHLORIDE 0.9 % IV BOLUS
1000.0000 mL | Freq: Once | INTRAVENOUS | Status: AC
  Administered 2020-11-01: 1000 mL via INTRAVENOUS

## 2020-11-01 MED ORDER — VECURONIUM BROMIDE 10 MG IV SOLR
INTRAVENOUS | Status: AC
  Filled 2020-11-01: qty 10

## 2020-11-01 MED ORDER — ETOMIDATE 2 MG/ML IV SOLN
INTRAVENOUS | Status: AC
  Administered 2020-11-01: 20 mg
  Filled 2020-11-01: qty 20

## 2020-11-01 MED ORDER — LIDOCAINE 2% (20 MG/ML) 5 ML SYRINGE
INTRAMUSCULAR | Status: AC
  Filled 2020-11-01: qty 5

## 2020-11-01 SURGICAL SUPPLY — 79 items
ADH SKN CLS APL DERMABOND .7 (GAUZE/BANDAGES/DRESSINGS)
APL PRP STRL LF DISP 70% ISPRP (MISCELLANEOUS)
APL SKNCLS STERI-STRIP NONHPOA (GAUZE/BANDAGES/DRESSINGS) ×6
BANDAGE ESMARK 6X9 LF (GAUZE/BANDAGES/DRESSINGS) IMPLANT
BENZOIN TINCTURE PRP APPL 2/3 (GAUZE/BANDAGES/DRESSINGS) ×3 IMPLANT
BLADE HEX COATED 2.75 (ELECTRODE) ×3 IMPLANT
BLADE SURG SZ10 CARB STEEL (BLADE) ×2 IMPLANT
BNDG CMPR 9X6 STRL LF SNTH (GAUZE/BANDAGES/DRESSINGS)
BNDG CMPR MED 10X6 ELC LF (GAUZE/BANDAGES/DRESSINGS) ×2
BNDG ELASTIC 3X5.8 VLCR STR LF (GAUZE/BANDAGES/DRESSINGS) ×1 IMPLANT
BNDG ELASTIC 4X5.8 VLCR STR LF (GAUZE/BANDAGES/DRESSINGS) IMPLANT
BNDG ELASTIC 6X10 VLCR STRL LF (GAUZE/BANDAGES/DRESSINGS) ×1 IMPLANT
BNDG ESMARK 6X9 LF (GAUZE/BANDAGES/DRESSINGS)
BNDG GAUZE ELAST 4 BULKY (GAUZE/BANDAGES/DRESSINGS) IMPLANT
CANISTER WOUNDNEG PRESSURE 500 (CANNISTER) ×3 IMPLANT
CHLORAPREP W/TINT 26 (MISCELLANEOUS) ×2 IMPLANT
COVER SURGICAL LIGHT HANDLE (MISCELLANEOUS) ×3 IMPLANT
COVER WAND RF STERILE (DRAPES) IMPLANT
CUFF TOURN SGL QUICK 18X4 (TOURNIQUET CUFF) IMPLANT
CUFF TOURN SGL QUICK 24 (TOURNIQUET CUFF)
CUFF TOURN SGL QUICK 34 (TOURNIQUET CUFF)
CUFF TRNQT CYL 24X4X16.5-23 (TOURNIQUET CUFF) IMPLANT
CUFF TRNQT CYL 34X4.125X (TOURNIQUET CUFF) IMPLANT
DECANTER SPIKE VIAL GLASS SM (MISCELLANEOUS) IMPLANT
DERMABOND ADVANCED (GAUZE/BANDAGES/DRESSINGS)
DERMABOND ADVANCED .7 DNX12 (GAUZE/BANDAGES/DRESSINGS) IMPLANT
DRAIN PENROSE 0.5X18 (DRAIN) IMPLANT
DRAPE IMP U-DRAPE 54X76 (DRAPES) ×2 IMPLANT
DRAPE INCISE IOBAN 66X45 STRL (DRAPES) ×1 IMPLANT
DRAPE LAPAROSCOPIC ABDOMINAL (DRAPES) IMPLANT
DRAPE LAPAROTOMY T 102X78X121 (DRAPES) IMPLANT
DRAPE LAPAROTOMY TRNSV 102X78 (DRAPES) IMPLANT
DRAPE ORTHO SPLIT 77X108 STRL (DRAPES) ×6
DRAPE SHEET LG 3/4 BI-LAMINATE (DRAPES) IMPLANT
DRAPE SURG ORHT 6 SPLT 77X108 (DRAPES) IMPLANT
DRAPE U-SHAPE 47X51 STRL (DRAPES) ×3 IMPLANT
DRSG MEPILEX SACRM 8.7X9.8 (GAUZE/BANDAGES/DRESSINGS) ×1 IMPLANT
DRSG PAD ABDOMINAL 8X10 ST (GAUZE/BANDAGES/DRESSINGS) ×4 IMPLANT
DRSG VAC ATS SM SENSATRAC (GAUZE/BANDAGES/DRESSINGS) ×3 IMPLANT
ELECT REM PT RETURN 15FT ADLT (MISCELLANEOUS) ×3 IMPLANT
EVACUATOR 1/8 PVC DRAIN (DRAIN) IMPLANT
GAUZE PACKING IODOFORM 1/4X15 (PACKING) ×3 IMPLANT
GAUZE SPONGE 4X4 12PLY STRL (GAUZE/BANDAGES/DRESSINGS) ×3 IMPLANT
GAUZE XEROFORM 5X9 LF (GAUZE/BANDAGES/DRESSINGS) IMPLANT
GLOVE ORTHO TXT STRL SZ7.5 (GLOVE) ×3 IMPLANT
GLOVE SRG 8 PF TXTR STRL LF DI (GLOVE) ×2 IMPLANT
GLOVE SURG ENC MOIS LTX SZ7 (GLOVE) ×9 IMPLANT
GLOVE SURG UNDER POLY LF SZ7 (GLOVE) ×3 IMPLANT
GLOVE SURG UNDER POLY LF SZ7.5 (GLOVE) ×8 IMPLANT
GLOVE SURG UNDER POLY LF SZ8 (GLOVE) ×3
GOWN STRL REUS W/ TWL XL LVL3 (GOWN DISPOSABLE) ×2 IMPLANT
GOWN STRL REUS W/TWL LRG LVL3 (GOWN DISPOSABLE) ×6 IMPLANT
GOWN STRL REUS W/TWL XL LVL3 (GOWN DISPOSABLE) ×2 IMPLANT
HANDPIECE INTERPULSE COAX TIP (DISPOSABLE)
KIT BASIN OR (CUSTOM PROCEDURE TRAY) ×4 IMPLANT
KIT TURNOVER KIT A (KITS) ×3 IMPLANT
MARKER SKIN DUAL TIP RULER LAB (MISCELLANEOUS) ×2 IMPLANT
NDL HYPO 25X1 1.5 SAFETY (NEEDLE) ×2 IMPLANT
NEEDLE HYPO 25X1 1.5 SAFETY (NEEDLE) IMPLANT
NS IRRIG 1000ML POUR BTL (IV SOLUTION) ×3 IMPLANT
PACK BASIC VI WITH GOWN DISP (CUSTOM PROCEDURE TRAY) ×3 IMPLANT
PACK GENERAL/GYN (CUSTOM PROCEDURE TRAY) ×1 IMPLANT
PACK ORTHO EXTREMITY (CUSTOM PROCEDURE TRAY) ×3 IMPLANT
PACK UNIVERSAL I (CUSTOM PROCEDURE TRAY) ×1 IMPLANT
PAD CAST 4YDX4 CTTN HI CHSV (CAST SUPPLIES) IMPLANT
PADDING CAST COTTON 4X4 STRL (CAST SUPPLIES)
PENCIL SMOKE EVACUATOR (MISCELLANEOUS) IMPLANT
PROTECTOR NERVE ULNAR (MISCELLANEOUS) IMPLANT
SET HNDPC FAN SPRY TIP SCT (DISPOSABLE) IMPLANT
SPONGE LAP 18X18 RF (DISPOSABLE) IMPLANT
SPONGE LAP 4X18 RFD (DISPOSABLE) ×2 IMPLANT
STAPLER VISISTAT 35W (STAPLE) IMPLANT
SUT ETHILON 2 0 PS N (SUTURE) IMPLANT
SUT MNCRL AB 4-0 PS2 18 (SUTURE) IMPLANT
SUT VIC AB 3-0 SH 18 (SUTURE) IMPLANT
SYR CONTROL 10ML LL (SYRINGE) ×3 IMPLANT
TOWEL OR 17X26 10 PK STRL BLUE (TOWEL DISPOSABLE) ×4 IMPLANT
TOWEL OR NON WOVEN STRL DISP B (DISPOSABLE) ×3 IMPLANT
TRAY PREP A LATEX SAFE STRL (SET/KITS/TRAYS/PACK) ×1 IMPLANT

## 2020-11-01 NOTE — Anesthesia Postprocedure Evaluation (Signed)
Anesthesia Post Note  Patient: Ronald Brady [facility]  Procedure(s) Performed: IRRIGATION AND DEBRIDEMENT EXTREMITY (Right ) LEFT VOLAR FOREARM DRESSING CHANGE (Left )     Patient location during evaluation: PACU Anesthesia Type: General Level of consciousness: awake and alert Pain management: pain level controlled Vital Signs Assessment: post-procedure vital signs reviewed and stable Respiratory status: spontaneous breathing, nonlabored ventilation, respiratory function stable and patient connected to nasal cannula oxygen Cardiovascular status: blood pressure returned to baseline and stable Postop Assessment: no apparent nausea or vomiting Anesthetic complications: no   No complications documented.  Last Vitals:  Vitals:   11/15/2020 1916 11/08/2020 2000  BP: 108/68 (!) 76/49  Pulse: (!) 106 (!) 115  Resp: (!) 23 18  Temp: 36.7 C   SpO2: 100% 100%    Last Pain:  Vitals:   11/16/2020 1916  TempSrc:   PainSc: 2                  Genova Kiner

## 2020-11-01 NOTE — Progress Notes (Signed)
[facility] ADULT ICU REPLACEMENT PROTOCOL   The patient does apply for the [facility] Adult ICU Electrolyte Replacment Protocol based on the criteria listed below:   1. Is GFR >/= 30 ml/min? Yes.    Patient's GFR today is >60 2. Is SCr </= 2? Yes.   Patient's SCr is 0.62 ml/kg/hr 3. Did SCr increase >/= 0.5 in 24 hours? No. 4. Abnormal electrolyte(s): K+3.1 5. Ordered repletion with: protocol 6. If a panic level lab has been reported, has the CCM MD in charge been notified? Yes.  .   Physician:  Dr. Cecile Sheerer, Lilia Argue 2020/11/18 6:17 AM

## 2020-11-01 NOTE — Progress Notes (Signed)
Pt's lower wound vac is giving error message (blockage detected). All clamps are open and the line is clear from notable obstructions. Pt is refusing any more investigation of why the wound vac is giving error message.  Pt states "their doing surgery on it tomorrow, just leave it alone and let me sleep".   RN will continue to monitor to best of ability for remaining of shift.

## 2020-11-01 NOTE — H&P (View-Only) (Signed)
[facility]  Date of Admission:  2020/11/03           Reason for visit: Follow up on MSSA bacteremia  Current antibiotics: Day 10 cefazolin (4/3--present)   ASSESSMENT:    1. MSSA bacteremia: In the setting of right lower extremity and left upper extremity abscesses.  Status post I&D 10/23/2020, 11/12/2020, and planning for repeat I&D this afternoon.  Repeat blood cultures negative from 10/24/2020 and TTE w/o evidence of vegetation.  TEE planned for Thursday 2. Septic shock: secondary to above 3. Type 2 DM:  A1c of 12.9 4. Recent GI bleed: suspected diverticular bleed and hemoglobin low this AM however no new reports of bleeding.  Getting blood transfusion now  PLAN:    . Continue cefazolin 2gm q8h IV . TEE planned . Source control per ortho . Vasopressors, blood products per ICU . Glycemic control, wound care   Principal Problem:   Severe sepsis with septic shock [facility]) Active Problems:   Abscess of right thigh   Abscess of left forearm   Diabetes mellitus (HCC)   Hyperosmolar hyperglycemic state (HHS) (HCC)   AKI (acute kidney injury) (HCC)   Acute lower UTI   Emphysema lung (HCC)   Abscess of right lower extremity   MSSA bacteremia   Pressure injury of skin   Malnutrition of moderate degree    MEDICATIONS:    Scheduled Meds: . (feeding supplement) PROSource Plus  30 mL Oral BID BM  . chlorhexidine  60 mL Topical Once  . chlorhexidine  15 mL Mouth Rinse BID  . Chlorhexidine Gluconate Cloth  6 each Topical Daily  . feeding supplement (GLUCERNA SHAKE)  237 mL Oral BID BM  . insulin aspart  0-5 Units Subcutaneous QHS  . insulin aspart  0-9 Units Subcutaneous TID WC  . insulin aspart  2 Units Subcutaneous TID WC  . insulin glargine  10 Units Subcutaneous QHS  . ipratropium-albuterol  3 mL Nebulization BID  . mouth rinse  15 mL Mouth Rinse q12n4p  . midodrine  10 mg Oral TID WC  . mometasone-formoterol  2 puff Inhalation BID  .  multivitamin with minerals  1 tablet Oral Daily  . nicotine  21 mg Transdermal Daily  . pantoprazole  40 mg Intravenous Q12H  . povidone-iodine  2 application Topical Once  . sodium chloride flush  10-40 mL Intracatheter Q12H   Continuous Infusions: .  ceFAZolin (ANCEF) IV Stopped (10/31/2020 9758)  . norepinephrine (LEVOPHED) Adult infusion 2 mcg/min (11/04/2020 1148)  . potassium chloride 10 mEq (11/19/2020 1248)   PRN Meds:.albuterol, ALPRAZolam, bisacodyl, dextrose, metoCLOPramide **OR** metoCLOPramide (REGLAN) injection, morphine injection, ondansetron **OR** ondansetron (ZOFRAN) IV, oxyCODONE-acetaminophen, phenol, sodium chloride flush  SUBJECTIVE:   24 hour events:  Significant purulent drainage during VAC change last night.  Plan to go back to the OR this afternoon with Dr. Ophelia Charter CBC this morning with hemoglobin 4.6 compared to 7.83 days prior Receiving 2 units of packed red blood cells Potassium 3.1  Patient reports no acute complaints.  Denies any bleeding.  Endorses feeling tired, however, is anxious to have surgery because he is hungry.  Review of Systems  Constitutional: Positive for malaise/fatigue. Negative for chills and fever.  Respiratory: Negative.   Cardiovascular: Negative.   Gastrointestinal: Negative for abdominal pain, blood in stool, melena, nausea and vomiting.  All other systems reviewed and are negative.     OBJECTIVE:   Blood pressure 131/71, pulse 97, temperature (!) 97.4  F (36.3 C), temperature source Axillary, resp. rate 19, height 5\' 5"  (1.651 m), weight 75.7 kg, SpO2 100 %. Body mass index is 27.77 kg/m.  Physical Exam Constitutional:      Comments: Alert, fatigued appearing, receiving blood transfusion  HENT:     Head: Normocephalic and atraumatic.  Pulmonary:     Effort: Pulmonary effort is normal. No respiratory distress.  Musculoskeletal:     Comments: Right upper extremity PICC line in place  Skin:    Coloration: Skin is pale.   Neurological:     General: No focal deficit present.     Mental Status: He is oriented to person, place, and time.  Psychiatric:        Mood and Affect: Mood normal.        Behavior: Behavior normal.      Lab Results: Lab Results  Component Value Date   WBC 5.1 10/24/2020   HGB 4.6 (LL) 10/30/2020   HCT 14.8 (L) 11/17/2020   MCV 92.5 11/19/2020   PLT 169 11/04/2020    Lab Results  Component Value Date   NA 139 11/16/2020   K 3.1 (L) 11/15/2020   CO2 25 11/16/2020   GLUCOSE 124 (H) 11/19/2020   BUN 25 (H) 11/04/2020   CREATININE 0.62 10/30/2020   CALCIUM 7.5 (L) 11/14/2020   GFRNONAA >60 11/04/2020   GFRAA >60 08/21/2019    Lab Results  Component Value Date   ALT <5 11/07/2020   AST 11 (L) 11/14/2020   ALKPHOS 109 11/15/2020   BILITOT <0.1 (L) 11/04/2020       Component Value Date/Time   CRP 4.3 (H) 10/21/2020 1053    No results found for: ESRSEDRATE   I have reviewed the micro and lab results in Epic.  Imaging: No results found.   Imaging  independently reviewed in Epic.    01/01/2021 for Infectious Disease [facility] [phone] pager 10/26/2020, 1:20 PM

## 2020-11-01 NOTE — Progress Notes (Signed)
Pt arrived from PACU. Pt hypotensive. Levo gtt titrated up.

## 2020-11-01 NOTE — Progress Notes (Signed)
IVT: Responded to stat call; Pt being intubated when I arrived at bedside; Pt with PICC and PIV access. RN stated MD to obtain CVC access.

## 2020-11-01 NOTE — Progress Notes (Signed)
eLink Physician-Brief Progress Note Patient Name: Ronald Brady DOB: Nov 27, 1960 MRN: [id number]   Date of Service  11/08/2020  HPI/Events of Note  Hemoglobin 4.6 gm / dl.  eICU Interventions  Order entered to transfuse 2 units PRBC.        [name] 10/21/2020, 6:06 AM

## 2020-11-01 NOTE — Anesthesia Preprocedure Evaluation (Signed)
Anesthesia Evaluation  Patient identified by MRN, date of birth, ID band Patient awake    Reviewed: Allergy & Precautions, H&P , NPO status , Patient's Chart, lab work & pertinent test results  Airway Mallampati: II   Neck ROM: full    Dental  (+) Edentulous Upper, Edentulous Lower   Pulmonary COPD,  COPD inhaler, Current Smoker,    breath sounds clear to auscultation       Cardiovascular hypertension, + CAD   Rhythm:regular Rate:Normal  10/24/2020 ECHO: EF 60-65%, no significant valvular abnormalities  Pt is currently on levo gtt   Neuro/Psych    GI/Hepatic   Endo/Other  diabetes, Insulin Dependent, Oral Hypoglycemic Agents  Renal/GU      Musculoskeletal   Abdominal   Peds  Hematology  (+) Blood dyscrasia (Hb 6.2, INR 1.4), anemia ,   Anesthesia Other Findings Sepsis: thigh, wrist infections  Reproductive/Obstetrics                             Anesthesia Physical  Anesthesia Plan  ASA: IV  Anesthesia Plan: General   Post-op Pain Management:    Induction: Intravenous  PONV Risk Score and Plan: 1 and Ondansetron and Treatment may vary due to age or medical condition  Airway Management Planned: Oral ETT and LMA  Additional Equipment: Arterial line  Intra-op Plan:   Post-operative Plan: Extubation in OR  Informed Consent: I have reviewed the patients History and Physical, chart, labs and discussed the procedure including the risks, benefits and alternatives for the proposed anesthesia with the patient or authorized representative who has indicated his/her understanding and acceptance.     Dental advisory given  Plan Discussed with: CRNA, Anesthesiologist and Surgeon  Anesthesia Plan Comments:         Anesthesia Quick Evaluation

## 2020-11-01 NOTE — Progress Notes (Signed)
[facility]  Date of Admission:  2020/11/03           Reason for visit: Follow up on MSSA bacteremia  Current antibiotics: Day 10 cefazolin (4/3--present)   ASSESSMENT:    1. MSSA bacteremia: In the setting of right lower extremity and left upper extremity abscesses.  Status post I&D 11/09/2020, 11/12/2020, and planning for repeat I&D this afternoon.  Repeat blood cultures negative from 10/24/2020 and TTE w/o evidence of vegetation.  TEE planned for Thursday 2. Septic shock: secondary to above 3. Type 2 DM:  A1c of 12.9 4. Recent GI bleed: suspected diverticular bleed and hemoglobin low this AM however no new reports of bleeding.  Getting blood transfusion now  PLAN:    . Continue cefazolin 2gm q8h IV . TEE planned . Source control per ortho . Vasopressors, blood products per ICU . Glycemic control, wound care   Principal Problem:   Severe sepsis with septic shock [facility]) Active Problems:   Abscess of right thigh   Abscess of left forearm   Diabetes mellitus (HCC)   Hyperosmolar hyperglycemic state (HHS) (HCC)   AKI (acute kidney injury) (HCC)   Acute lower UTI   Emphysema lung (HCC)   Abscess of right lower extremity   MSSA bacteremia   Pressure injury of skin   Malnutrition of moderate degree    MEDICATIONS:    Scheduled Meds: . (feeding supplement) PROSource Plus  30 mL Oral BID BM  . chlorhexidine  60 mL Topical Once  . chlorhexidine  15 mL Mouth Rinse BID  . Chlorhexidine Gluconate Cloth  6 each Topical Daily  . feeding supplement (GLUCERNA SHAKE)  237 mL Oral BID BM  . insulin aspart  0-5 Units Subcutaneous QHS  . insulin aspart  0-9 Units Subcutaneous TID WC  . insulin aspart  2 Units Subcutaneous TID WC  . insulin glargine  10 Units Subcutaneous QHS  . ipratropium-albuterol  3 mL Nebulization BID  . mouth rinse  15 mL Mouth Rinse q12n4p  . midodrine  10 mg Oral TID WC  . mometasone-formoterol  2 puff Inhalation BID  .  multivitamin with minerals  1 tablet Oral Daily  . nicotine  21 mg Transdermal Daily  . pantoprazole  40 mg Intravenous Q12H  . povidone-iodine  2 application Topical Once  . sodium chloride flush  10-40 mL Intracatheter Q12H   Continuous Infusions: .  ceFAZolin (ANCEF) IV Stopped (10/31/2020 9758)  . norepinephrine (LEVOPHED) Adult infusion 2 mcg/min (11/04/2020 1148)  . potassium chloride 10 mEq (11/19/2020 1248)   PRN Meds:.albuterol, ALPRAZolam, bisacodyl, dextrose, metoCLOPramide **OR** metoCLOPramide (REGLAN) injection, morphine injection, ondansetron **OR** ondansetron (ZOFRAN) IV, oxyCODONE-acetaminophen, phenol, sodium chloride flush  SUBJECTIVE:   24 hour events:  Significant purulent drainage during VAC change last night.  Plan to go back to the OR this afternoon with Dr. Ophelia Charter CBC this morning with hemoglobin 4.6 compared to 7.83 days prior Receiving 2 units of packed red blood cells Potassium 3.1  Patient reports no acute complaints.  Denies any bleeding.  Endorses feeling tired, however, is anxious to have surgery because he is hungry.  Review of Systems  Constitutional: Positive for malaise/fatigue. Negative for chills and fever.  Respiratory: Negative.   Cardiovascular: Negative.   Gastrointestinal: Negative for abdominal pain, blood in stool, melena, nausea and vomiting.  All other systems reviewed and are negative.     OBJECTIVE:   Blood pressure 131/71, pulse 97, temperature (!) 97.4  F (36.3 C), temperature source Axillary, resp. rate 19, height 5' 5" (1.651 m), weight 75.7 kg, SpO2 100 %. Body mass index is 27.77 kg/m.  Physical Exam Constitutional:      Comments: Alert, fatigued appearing, receiving blood transfusion  HENT:     Head: Normocephalic and atraumatic.  Pulmonary:     Effort: Pulmonary effort is normal. No respiratory distress.  Musculoskeletal:     Comments: Right upper extremity PICC line in place  Skin:    Coloration: Skin is pale.   Neurological:     General: No focal deficit present.     Mental Status: He is oriented to person, place, and time.  Psychiatric:        Mood and Affect: Mood normal.        Behavior: Behavior normal.      Lab Results: Lab Results  Component Value Date   WBC 5.1 10/24/2020   HGB 4.6 (LL) 11/14/2020   HCT 14.8 (L) 10/22/2020   MCV 92.5 11/10/2020   PLT 169 10/31/2020    Lab Results  Component Value Date   NA 139 10/24/2020   K 3.1 (L) 11/12/2020   CO2 25 11/11/2020   GLUCOSE 124 (H) 11/12/2020   BUN 25 (H) 10/28/2020   CREATININE 0.62 11/19/2020   CALCIUM 7.5 (L) 11/13/2020   GFRNONAA >60 11/14/2020   GFRAA >60 08/21/2019    Lab Results  Component Value Date   ALT <5 11/04/2020   AST 11 (L) 11/16/2020   ALKPHOS 109 11/04/2020   BILITOT <0.1 (L) 11/04/2020       Component Value Date/Time   CRP 4.3 (H) 11/16/2020 1053    No results found for: ESRSEDRATE   I have reviewed the micro and lab results in Epic.  Imaging: No results found.   Imaging  independently reviewed in Epic.    Ronald Brady N Daysie Helf [facility] [phone] pager 11/10/2020, 1:20 PM    

## 2020-11-01 NOTE — Interval H&P Note (Signed)
History and Physical Interval Note:  10/31/2020 5:40 PM  Ronald Brady  has presented today for surgery, with the diagnosis of INCISION AND DRAINAGE RIGHT ABSCESS, DISTAL HAMSTRING AND MEDIAL KNEE. LEFT VOLAR FOREARM DRESSING CHANGE.Marland Kitchen  The various methods of treatment have been discussed with the patient and family. After consideration of risks, benefits and other options for treatment, the patient has consented to  Procedure(s): IRRIGATION AND DEBRIDEMENT EXTREMITY (Right) LEFT VOLAR FOREARM DRESSING CHANGE (Left) as a surgical intervention.  The patient's history has been reviewed, patient examined, no change in status, stable for surgery.  I have reviewed the patient's chart and labs.  Questions were answered to the patient's satisfaction.     Eldred Manges

## 2020-11-01 NOTE — Op Note (Addendum)
Preop diagnosis: Right posterior thigh and right medial knee abscess.  Left volar subcutaneous forearm abscess with packing.  Postop diagnosis: Same  Procedure: VAC removal x2 right posterior thigh.  Sharp excisional debridement of skin and subcutaneous tissue and tendon.  Sharp excisional debridement of medial right subcutaneous knee with excision of subcutaneous tissue and necrotic tendon.  Packing removal dressing change left volar forearm. Sharp excisional debridement of skin subtenons tissue.  Surgeon: Ophelia Charter MD  Anesthesia General.  Procedure: After induction general anesthesia patient was placed prone on chest rolls.  Prepping with Betadine solution after removal of the VAC right posterior thigh x2.  Patient had pain granulation tissue proximally just below the gluteal fold but the more distal mid to distal thigh back still have purulent drainage.  There was a separate area that had trace drainage medially which was prominent over the medial knee which was fluctuant.  This followed the course of the semitendinosis which had previously been debrided and was necrotic.  Using suction copious irrigation 1 L of saline solution was irrigated in the proximal and mid right posterior thigh abscess areas.  Distal aspect had more necrotic tissue and pieces of necrotic tendon were graft suctioned pulled up along with necrotic fascia and sharply excised with pickups scalpel rondure and Mayo scissors.  Once no remaining necrotic tissue was present scalpel was used to make incision over the medial aspect of the knee worse fluctuant small amount of bleeding was present subtendinous tissue was sharply excised with a scalpel and then some necrotic tendon was grasped pulled up and resected.  Copious irrigation was then used with bulb syringe 1 L.  I was able to pass the normal tonsil sucker tip from the mid distal thigh down to the subcutaneous medial knee that connected following the course of the semitendinosis  which had been necrotic after rupturing and then developing abscess formation.  Continued the debridement sharply was performed with pickups and scissors until not necrotic tissue was left.  No purulence was able to be expressed from the mid to distal thigh opening and I proceeded to apply 3 vacs 1 proximal thigh one mid to distal thigh and then 1 over the medial knee and additional pieces of strip were applied until there was no leak.  Next attention was then turned to the  volar forearm on the left.  Iodoform packing was removed a small amount of necrotic tissue was pulled out which was volar fascia excised with a 10 scalpel blade.  Bulb syringe irrigation was performed.  There was some volar tendon visualized but it was not necrotic and this was to the ring and long flexor.  Repeat goes copious irrigation followed by repacking with outer form quarter-inch strips 4 x 4's 2020 fingers and then an Ace wrap.  Continue daily dressing changes will be full performed by the floor RN.  VAC changes will be done by the [facility] team 3 times a week for the thigh.  The distalmost VAC near the knee may not require continued use of the [facility] for very long.  Mid thigh and proximal thigh will likely take a few weeks.  Debridement type: Excisional Debridement  Side: bilaterally  Body Location: right post thigh left volar forearm   Tools used for debridement: scalpel, scissors and rongeur  Pre-debridement Wound size (cm):   Length: same as before last op note        Width:      Depth:   Post-debridement Wound size (cm):  Length:        Width:      Depth:   Debridement depth beyond dead/damaged tissue down to healthy viable tissue: yes  Tissue layer involved: skin, subcutaneous tissue and skin, subcutaneous tissue, muscle / fascia  Nature of tissue removed: Necrotic, Devitalized Tissue and Non-viable tissue purulence  Irrigation volume:  2L     Irrigation fluid type: Normal Saline

## 2020-11-01 NOTE — Interval H&P Note (Signed)
History and Physical Interval Note:  11/14/2020 5:41 PM  Ronald Brady [facility]  has presented today for surgery, with the diagnosis of INCISION AND DRAINAGE RIGHT ABSCESS, DISTAL HAMSTRING AND MEDIAL KNEE. LEFT VOLAR FOREARM DRESSING CHANGE.Marland Kitchen  The various methods of treatment have been discussed with the patient and family. After consideration of risks, benefits and other options for treatment, the patient has consented to  Procedure(s): IRRIGATION AND DEBRIDEMENT EXTREMITY (Right) LEFT VOLAR FOREARM DRESSING CHANGE (Left) as a surgical intervention.  The patient's history has been reviewed, patient examined, no change in status, stable for surgery.  I have reviewed the patient's chart and labs.  Questions were answered to the patient's satisfaction.     Eldred Manges

## 2020-11-01 NOTE — Transfer of Care (Signed)
Immediate Anesthesia Transfer of Care Note  Patient: Ronald Brady [facility]  Procedure(s) Performed: IRRIGATION AND DEBRIDEMENT EXTREMITY (Right ) LEFT VOLAR FOREARM DRESSING CHANGE (Left )  Patient Location: PACU  Anesthesia Type:General  Level of Consciousness: drowsy and patient cooperative  Airway & Oxygen Therapy: Patient Spontanous Breathing and Patient connected to face mask oxygen  Post-op Assessment: Report given to RN and Post -op Vital signs reviewed and stable  Post vital signs: Reviewed and stable  Last Vitals:  Vitals Value Taken Time  BP    Temp    Pulse    Resp 22 11/09/2020 1924  SpO2    Vitals shown include unvalidated device data.  Last Pain:  Vitals:   11/11/2020 1730  TempSrc:   PainSc: 0-No pain      Patients Stated Pain Goal: 0 (10/31/20 1130)  Complications: No complications documented.

## 2020-11-01 NOTE — Anesthesia Procedure Notes (Signed)
Procedure Name: Intubation Date/Time: 11/18/2020 6:10 PM Performed by: Montel Clock, CRNA Pre-anesthesia Checklist: Patient identified, Emergency Drugs available, Suction available, Patient being monitored and Timeout performed Patient Re-evaluated:Patient Re-evaluated prior to induction Oxygen Delivery Method: Circle system utilized Preoxygenation: Pre-oxygenation with 100% oxygen Induction Type: IV induction and Rapid sequence Laryngoscope Size: Mac and 3 Grade View: Grade I Tube type: Oral Tube size: 7.5 mm Number of attempts: 1 Airway Equipment and Method: Stylet Placement Confirmation: ETT inserted through vocal cords under direct vision,  positive ETCO2 and breath sounds checked- equal and bilateral Secured at: 23 cm Tube secured with: Tape Dental Injury: Teeth and Oropharynx as per pre-operative assessment

## 2020-11-01 NOTE — Progress Notes (Addendum)
eLink Physician-Brief Progress Note Patient Name: Ronald Brady DOB: 1961/02/02 MRN: [id number]   Date of Service  11/12/2020  HPI/Events of Note  Hypotension - BP = 88/35 with MAP = 43. CVP = 6. Last Hgb prior to bleeding = 7.2. Now on Norepinephrine IV infusion at 28 mcg/min.   eICU Interventions  Plan: 1. Transfuse 2 units PRBC emergently.  2. Bolus with 0.9 NaCl 1 liter IV over 1 hour now. 3. Keep 2 units PRBC in blood bank at all times.  4. Place A-line. 5. PT/INR and PTT STAT.  6. Will request that the PCCM ground team evaluate him at bedside.         [name] 11/16/2020, 10:26 PM

## 2020-11-01 NOTE — Progress Notes (Signed)
OT Cancellation Note  Patient Details Name: Ronald Brady MRN: [id number] DOB: 03-06-1961   Cancelled Treatment:    Reason Eval/Treat Not Completed: Medical issues which prohibited therapy:  Noted Hgb 4.6. Will hold per rehab protocol, requiring Hgb at least 7.0 to consider therapy.  Will continue efforts.   Theodoro Clock 11/19/2020, 11:44 AM

## 2020-11-01 NOTE — Progress Notes (Signed)
eLink Physician-Brief Progress Note Patient Name: Ronald Brady DOB: [birth year] MRN: [id number]   Date of Service  Nov 15, 2020  HPI/Events of Note  Nursing request for H/H.   eICU Interventions  Will order H/H STAT.     Intervention Category Major Interventions: Other:  Lenell Antu 11-15-20, 10:05 PM

## 2020-11-01 NOTE — Progress Notes (Signed)
NAME:  Ronald Brady, MRN:  [id number], DOB:  [birth year], LOS: 10 ADMISSION DATE:  10/21/2020, CONSULTATION DATE:  4/3 REFERRING MD:  [facility], CHIEF COMPLAINT:  sepsis   History of Present Illness:  60 y/o M, smoker, with poorly controlled DM who presented to [facility] 4/2 after being found at home in a chair covered in urine & feces.  Seen with multiple wounds   Pertinent  Medical History  Tobacco Abuse - began smoking at 36 Worked as a logger  Poorly controlled DM - Hgb A1c 11.8 in 07/2019  HTN   Significant Hospital Events: Including procedures, antibiotic start and stop dates in addition to other pertinent events    4/02 presented to ER in shock due to multiple abscess (right thigh, left wrist), DKA. CT femur found elongated gas and fluid collection in the right hamstring c/w abscess. No evidence of osteomyelitis. Treated with clinda, meropenem, cultured, central line placed. I&D in ER.   4/03 Wrist CT with gas throughout the SQ tissues in the anterior distal forearm. Ortho consulted > taken to OR for debridement of right posterior hamstring and left forearm. Intraoperative cultures obtained. Cultures growing staph aureus (sensitivities pending). Vanco added but abx narrowed to cefazolin late pm  4/04 Ortho note reflects concern for poor wound healing, may need hip disarticulation. On levophed 1-2 mcg, BCID with staph aureus, UC 100k staph aureus.  ECHO 60-65% EF, no evidence of vegetation mentioned  4/5 CT Femur w/o contrast with multilocular fluid collection in the lower hamstrings muscles at the level of the mid to distal femur with foci of SQ emphysema  4/6 GIB overnight, Hgb down to 5.9, tx 2 units PRBC's, 1 unit FFP, PPI gtt initiated + IVF.  Follow up Hgb 7.3, remains on low dose pressors. GI consulted.   4/8 Repeat I&D of left wrist and right thigh, extensive purulent material with new abscess seen near pelvis   4/9 weaning pressors, decreased meal time insulin  4/10  intermittent hypotension, stop diuresis  4/11 on pressors, MAP above goal, wean  4/12 almost off NE, plan for OR later today  Interim History / Subjective:  No acute issues overnight  On NE, weaning Upset he is NPO for OR later today  Objective   Blood pressure 127/67, pulse 98, temperature (!) 97.5 F (36.4 C), temperature source Axillary, resp. rate 13, height 5\' 5"  (1.651 m), weight 75.7 kg, SpO2 100 %.        Intake/Output Summary (Last 24 hours) at 11/09/2020 1017 Last data filed at 11/10/2020 01/01/2021 Gross per 24 hour  Intake 566.33 ml  Output 1000 ml  Net -433.67 ml   Filed Weights   11/12/2020 1510 10/24/20 0515 10/29/2020 1740  Weight: 70 kg 75.7 kg 75.7 kg    Examination: General: Chronically ill appearing deconditioned middle aged male lying in bed in NAD CV: s1s2 regular rate and rhythm, no murmur, rubs, or gallops,  PULM:  Clear to ascultation, no added breath sounds, no increased work of breathing GI: soft, bowel sounds active in all 4 quadrants, non-tender, non-distended, tolerating diet, no further signs of GIB Extremities: warm/dry, no edema  Skin: no rashes or lesions  Labs/imaging that I have   4/2 blood cultures > Positive for Staph aureus   4/2 CT femur > Elongated gas and fluid collection noted along the surface of the hamstring muscles, in some areas extending deep into the hamstring muscles compatible with intramuscular abscess. This extends over an extensive area in length  extending from the soft tissue defect in the proximal posteromedial right thigh distally to just above the knee.  4/3 wound culture > Positive Staph aureus   4/3 CT wrist > Edema and locules of gas throughout the subcutaneous soft tissues in the anterior distal forearm. No well-defined focal abscess. This could be further evaluated with ultrasound if felt clinically indicated.  4/5 CT femur > There remains a multilocular fluid collection within the lower hamstrings muscles at the level  of the mid to distal femur with foci of subcutaneous emphysema measuring 5.4 x 3.7 x 3 x 6.0 cm.  4/6 CTA ABD/Pelvis > No evidence for active arterial GI bleeding. Diffuse gastric wall thickening and duodenal wall thickening, suggestive of gastritis and duodenitis. Incidentally noted small bowel malrotation. Small bilateral pleural effusions with adjacent atelectasis.  Resolved Hospital Problem list     Assessment & Plan:   Septic Shock secondary MSSA Abscess of R Thigh, L Wrist with MSSA, MSSA bacteremia,  component of hypovolemia -TTE w/o evidence of endocarditis -TEE arranged 4/14 (NPO Wednesday night) -Plan back to OR 4/12 P: ID and Ortho following, appreciate assistance Midodrine TID Remains on low dose Levo, weaning MAP goal > 65 Continue IV Cefazolin Pain control  Continue wound vacs   Poorly Controlled DM with Hyperglycemia  -Hyperglycemia on admit with glucose >900, small ketones in urine.  Hgb A1c 12.9 -BG better with insulin P: Continue SSI, Lantus, and meal coverage - decreased meal coverage with hypoglycemia overnight 4/9 Discontinue home metformin - refusing  GIB, improved  -BRBPR, suspect diverticular bleed  -CTA ABD/pevis with no arterial bleed  P: GI assess and have now signed off as of 4/7 Conservative measures, due to critical illness Supportive care  Hgb stable PPI  Hypokalemia, in setting of lasix, poor PO intake P: Trend Bmet  Supplement as needed   Anemia  P: Trend CBC  Transfuse per protocol Hgb goal greater than 7  Active Smoker, Suspected COPD / Asthmatic Bronchitis P: Continue BDs Encourage pulmonary hygiene  Smoking cessation encouraged   Best practice   Diet:  Oral Pain/Anxiety/Delirium protocol (if indicated): No VAP protocol (if indicated): Not indicated DVT prophylaxis: SCD given recent GI bleed and need for pressors GI prophylaxis: PPI Glucose control:  SSI Yes and Basal insulin Yes Central venous access:  Yes, and it is  still needed Arterial line:  N/A Foley:  Yes, and it is still needed Mobility:  OOB  PT consulted: N/A Last date of multidisciplinary goals of care discussion n/a Code Status:  full code Disposition: ICU    CRITICAL CARE Performed by: Karren Burly  Total critical care time: 38 minutes  Critical care time was exclusive of separately billable procedures and treating other patients.  Critical care was necessary to treat or prevent imminent or life-threatening deterioration.  Critical care was time spent personally by me on the following activities: development of treatment plan with patient and/or surrogate as well as nursing, discussions with consultants, evaluation of patient's response to treatment, examination of patient, obtaining history from patient or surrogate, ordering and performing treatments and interventions, ordering and review of laboratory studies, ordering and review of radiographic studies, pulse oximetry and re-evaluation of patient's condition.  Karren Burly, MD [facility] Personal contact information can be found on AMION, please see AMION for correct PCCM contact after 7p 11-24-20, 10:17 AM

## 2020-11-02 ENCOUNTER — Inpatient Hospital Stay (HOSPITAL_COMMUNITY): Payer: Medicaid Other

## 2020-11-02 ENCOUNTER — Inpatient Hospital Stay: Payer: Self-pay

## 2020-11-02 ENCOUNTER — Encounter (HOSPITAL_COMMUNITY): Admission: EM | Disposition: E | Payer: Self-pay | Source: Home / Self Care | Attending: Pulmonary Disease

## 2020-11-02 ENCOUNTER — Encounter (HOSPITAL_COMMUNITY): Payer: Self-pay | Admitting: Family Medicine

## 2020-11-02 DIAGNOSIS — R6521 Severe sepsis with septic shock: Secondary | ICD-10-CM | POA: Diagnosis not present

## 2020-11-02 DIAGNOSIS — J9690 Respiratory failure, unspecified, unspecified whether with hypoxia or hypercapnia: Secondary | ICD-10-CM

## 2020-11-02 DIAGNOSIS — L02415 Cutaneous abscess of right lower limb: Secondary | ICD-10-CM | POA: Diagnosis not present

## 2020-11-02 DIAGNOSIS — E1169 Type 2 diabetes mellitus with other specified complication: Secondary | ICD-10-CM | POA: Diagnosis not present

## 2020-11-02 DIAGNOSIS — L02414 Cutaneous abscess of left upper limb: Secondary | ICD-10-CM | POA: Diagnosis not present

## 2020-11-02 DIAGNOSIS — A419 Sepsis, unspecified organism: Secondary | ICD-10-CM | POA: Diagnosis not present

## 2020-11-02 HISTORY — PX: IR EMBO ART  VEN HEMORR LYMPH EXTRAV  INC GUIDE ROADMAPPING: IMG5450

## 2020-11-02 HISTORY — PX: IR ANGIOGRAM VISCERAL SELECTIVE: IMG657

## 2020-11-02 HISTORY — PX: HEMOSTASIS CONTROL: SHX6838

## 2020-11-02 HISTORY — PX: SUBMUCOSAL INJECTION: SHX5543

## 2020-11-02 HISTORY — PX: ESOPHAGOGASTRODUODENOSCOPY: SHX5428

## 2020-11-02 HISTORY — PX: IR ANGIOGRAM SELECTIVE EACH ADDITIONAL VESSEL: IMG667

## 2020-11-02 HISTORY — PX: IR US GUIDE VASC ACCESS RIGHT: IMG2390

## 2020-11-02 LAB — COMPREHENSIVE METABOLIC PANEL
ALT: 37 U/L (ref 0–44)
AST: 140 U/L — ABNORMAL HIGH (ref 15–41)
Albumin: 1.9 g/dL — ABNORMAL LOW (ref 3.5–5.0)
Alkaline Phosphatase: 75 U/L (ref 38–126)
Anion gap: 12 (ref 5–15)
BUN: 23 mg/dL — ABNORMAL HIGH (ref 6–20)
CO2: 18 mmol/L — ABNORMAL LOW (ref 22–32)
Calcium: 6.5 mg/dL — ABNORMAL LOW (ref 8.9–10.3)
Chloride: 108 mmol/L (ref 98–111)
Creatinine, Ser: 0.82 mg/dL (ref 0.61–1.24)
GFR, Estimated: >60 mL/min (ref >60)
Glucose, Bld: 312 mg/dL — ABNORMAL HIGH (ref 70–99)
Potassium: 4.5 mmol/L (ref 3.5–5.1)
Sodium: 138 mmol/L (ref 135–145)
Total Bilirubin: 0.9 mg/dL (ref 0.3–1.2)
Total Protein: 3.8 g/dL — ABNORMAL LOW (ref 6.5–8.1)

## 2020-11-02 LAB — BLOOD GAS, ARTERIAL
Acid-base deficit: 11.9 mmol/L — ABNORMAL HIGH (ref 0.0–2.0)
Acid-base deficit: 12.5 mmol/L — ABNORMAL HIGH (ref 0.0–2.0)
Bicarbonate: 14.8 mmol/L — ABNORMAL LOW (ref 20.0–28.0)
Bicarbonate: 15.1 mmol/L — ABNORMAL LOW (ref 20.0–28.0)
FIO2: 100
FIO2: 80
MECHVT: 480 mL
O2 Saturation: 99.8 %
O2 Saturation: 99.8 %
PEEP: 5 cmH2O
Patient temperature: 33.1
Patient temperature: 98.6
RATE: 24 resp/min
pCO2 arterial: 33.5 mmHg (ref 32.0–48.0)
pCO2 arterial: 41.5 mmHg (ref 32.0–48.0)
pH, Arterial: 7.176 — CL (ref 7.350–7.450)
pH, Arterial: 7.249 — ABNORMAL LOW (ref 7.350–7.450)
pO2, Arterial: 306 mmHg — ABNORMAL HIGH (ref 83.0–108.0)
pO2, Arterial: 421 mmHg — ABNORMAL HIGH (ref 83.0–108.0)

## 2020-11-02 LAB — MAGNESIUM: Magnesium: 1.5 mg/dL — ABNORMAL LOW (ref 1.7–2.4)

## 2020-11-02 LAB — PREPARE PLATELET PHERESIS: Unit division: 0

## 2020-11-02 LAB — CBC
HCT: 21.8 % — ABNORMAL LOW (ref 39.0–52.0)
HCT: 39 % (ref 39.0–52.0)
HCT: 38.7 % — ABNORMAL LOW (ref 39.0–52.0)
Hemoglobin: 6.9 g/dL — CL (ref 13.0–17.0)
Hemoglobin: 13.5 g/dL (ref 13.0–17.0)
Hemoglobin: 13.6 g/dL (ref 13.0–17.0)
MCH: 30.9 pg (ref 26.0–34.0)
MCH: 30 pg (ref 26.0–34.0)
MCH: 30.1 pg (ref 26.0–34.0)
MCHC: 31.7 g/dL (ref 30.0–36.0)
MCHC: 34.6 g/dL (ref 30.0–36.0)
MCHC: 35.1 g/dL (ref 30.0–36.0)
MCV: 97.8 fL (ref 80.0–100.0)
MCV: 86.7 fL (ref 80.0–100.0)
MCV: 85.6 fL (ref 80.0–100.0)
Platelets: 140 10*3/uL — ABNORMAL LOW (ref 150–400)
Platelets: 61 10*3/uL — ABNORMAL LOW (ref 150–400)
Platelets: 74 10*3/uL — ABNORMAL LOW (ref 150–400)
RBC: 2.23 MIL/uL — ABNORMAL LOW (ref 4.22–5.81)
RBC: 4.5 MIL/uL (ref 4.22–5.81)
RBC: 4.52 MIL/uL (ref 4.22–5.81)
RDW: 13.6 % (ref 11.5–15.5)
RDW: 14.1 % (ref 11.5–15.5)
RDW: 14.4 % (ref 11.5–15.5)
WBC: 20.1 10*3/uL — ABNORMAL HIGH (ref 4.0–10.5)
WBC: 11.4 10*3/uL — ABNORMAL HIGH (ref 4.0–10.5)
WBC: 10.8 10*3/uL — ABNORMAL HIGH (ref 4.0–10.5)
nRBC: 0.1 % (ref 0.0–0.2)
nRBC: 1 % — ABNORMAL HIGH (ref 0.0–0.2)
nRBC: 1.1 % — ABNORMAL HIGH (ref 0.0–0.2)

## 2020-11-02 LAB — GLUCOSE, CAPILLARY
Glucose-Capillary: 103 mg/dL — ABNORMAL HIGH (ref 70–99)
Glucose-Capillary: 150 mg/dL — ABNORMAL HIGH (ref 70–99)
Glucose-Capillary: 201 mg/dL — ABNORMAL HIGH (ref 70–99)
Glucose-Capillary: 218 mg/dL — ABNORMAL HIGH (ref 70–99)
Glucose-Capillary: 287 mg/dL — ABNORMAL HIGH (ref 70–99)
Glucose-Capillary: 88 mg/dL (ref 70–99)

## 2020-11-02 LAB — PHOSPHORUS: Phosphorus: 6.9 mg/dL — ABNORMAL HIGH (ref 2.5–4.6)

## 2020-11-02 LAB — BPAM PLATELET PHERESIS
Blood Product Expiration Date: 202204142359
ISSUE DATE / TIME: 202204122316
Unit Type and Rh: 6200

## 2020-11-02 LAB — PROTIME-INR
INR: 2.3 — ABNORMAL HIGH (ref 0.8–1.2)
INR: 1.6 — ABNORMAL HIGH (ref 0.8–1.2)
Prothrombin Time: 25.4 seconds — ABNORMAL HIGH (ref 11.4–15.2)
Prothrombin Time: 19.4 seconds — ABNORMAL HIGH (ref 11.4–15.2)

## 2020-11-02 LAB — APTT: aPTT: 33 seconds (ref 24–36)

## 2020-11-02 LAB — LACTIC ACID, PLASMA
Lactic Acid, Venous: 4.8 mmol/L (ref 0.5–1.9)
Lactic Acid, Venous: 3.5 mmol/L (ref 0.5–1.9)

## 2020-11-02 SURGERY — EGD (ESOPHAGOGASTRODUODENOSCOPY)
Anesthesia: Moderate Sedation

## 2020-11-02 MED ORDER — SODIUM CHLORIDE 0.9 % IV SOLN
50.0000 ug/h | INTRAVENOUS | Status: DC
  Administered 2020-11-02 (×2): 50 ug/h via INTRAVENOUS
  Filled 2020-11-02 (×2): qty 1

## 2020-11-02 MED ORDER — FENTANYL BOLUS VIA INFUSION
50.0000 ug | INTRAVENOUS | Status: DC | PRN
  Administered 2020-11-02: 50 ug via INTRAVENOUS
  Administered 2020-11-02 (×2): 100 ug via INTRAVENOUS
  Administered 2020-11-02: 75 ug via INTRAVENOUS
  Administered 2020-11-02 – 2020-11-03 (×4): 100 ug via INTRAVENOUS
  Administered 2020-11-04 (×2): 50 ug via INTRAVENOUS
  Administered 2020-11-04: 100 ug via INTRAVENOUS
  Filled 2020-11-02: qty 100

## 2020-11-02 MED ORDER — SODIUM CHLORIDE 0.9 % IV SOLN
80.0000 mg | Freq: Once | INTRAVENOUS | Status: AC
  Administered 2020-11-02: 80 mg via INTRAVENOUS
  Filled 2020-11-02: qty 80

## 2020-11-02 MED ORDER — DOCUSATE SODIUM 50 MG/5ML PO LIQD
100.0000 mg | Freq: Two times a day (BID) | ORAL | Status: DC
  Administered 2020-11-04: 100 mg
  Filled 2020-11-02 (×2): qty 10

## 2020-11-02 MED ORDER — MIDAZOLAM HCL (PF) 5 MG/ML IJ SOLN
INTRAMUSCULAR | Status: AC
  Filled 2020-11-02: qty 2

## 2020-11-02 MED ORDER — MIDAZOLAM HCL 2 MG/2ML IJ SOLN
INTRAMUSCULAR | Status: AC
  Filled 2020-11-02: qty 2

## 2020-11-02 MED ORDER — OXYCODONE HCL 5 MG/5ML PO SOLN: 10.0000 mg | ORAL | Status: DC | PRN

## 2020-11-02 MED ORDER — LIDOCAINE HCL (PF) 1 % IJ SOLN
INTRAMUSCULAR | Status: AC
  Filled 2020-11-02: qty 30

## 2020-11-02 MED ORDER — PHENYLEPHRINE HCL-NACL 10-0.9 MG/250ML-% IV SOLN
0.0000 ug/min | INTRAVENOUS | Status: DC
  Administered 2020-11-02: 20 ug/min via INTRAVENOUS
  Filled 2020-11-02: qty 250

## 2020-11-02 MED ORDER — ALBUMIN HUMAN 25 % IV SOLN: 12.5000 g | Freq: Once | INTRAVENOUS | Status: DC

## 2020-11-02 MED ORDER — ORAL CARE MOUTH RINSE
15.0000 mL | OROMUCOSAL | Status: DC
  Administered 2020-11-02 – 2020-11-05 (×22): 15 mL via OROMUCOSAL

## 2020-11-02 MED ORDER — SODIUM CHLORIDE 0.9% IV SOLUTION: Freq: Once | INTRAVENOUS | Status: AC

## 2020-11-02 MED ORDER — IOHEXOL 300 MG/ML  SOLN
100.0000 mL | Freq: Once | INTRAMUSCULAR | Status: AC | PRN
  Administered 2020-11-02: 15 mL via INTRA_ARTERIAL

## 2020-11-02 MED ORDER — ACETAMINOPHEN 160 MG/5ML PO SOLN: 650.0000 mg | ORAL | Status: DC | PRN

## 2020-11-02 MED ORDER — MIDAZOLAM HCL 2 MG/2ML IJ SOLN
2.0000 mg | INTRAMUSCULAR | Status: DC | PRN
  Administered 2020-11-03 – 2020-11-04 (×2): 2 mg via INTRAVENOUS
  Filled 2020-11-02 (×2): qty 2

## 2020-11-02 MED ORDER — FENTANYL CITRATE (PF) 100 MCG/2ML IJ SOLN
INTRAMUSCULAR | Status: AC
  Filled 2020-11-02: qty 2

## 2020-11-02 MED ORDER — LACTATED RINGERS IV BOLUS
1000.0000 mL | Freq: Once | INTRAVENOUS | Status: AC
  Administered 2020-11-02: 1000 mL via INTRAVENOUS

## 2020-11-02 MED ORDER — SODIUM CHLORIDE (PF) 0.9 % IJ SOLN
PREFILLED_SYRINGE | INTRAMUSCULAR | Status: DC | PRN
  Administered 2020-11-02: 15 mL

## 2020-11-02 MED ORDER — INSULIN GLARGINE 100 UNIT/ML ~~LOC~~ SOLN
10.0000 [IU] | Freq: Every day | SUBCUTANEOUS | Status: DC
  Filled 2020-11-02: qty 0.1

## 2020-11-02 MED ORDER — SODIUM CHLORIDE 0.9 % IV SOLN
2.0000 g | INTRAVENOUS | Status: DC
  Administered 2020-11-02: 2 g via INTRAVENOUS
  Filled 2020-11-02: qty 2

## 2020-11-02 MED ORDER — METOCLOPRAMIDE HCL 5 MG/5ML PO SOLN
5.0000 mg | Freq: Three times a day (TID) | ORAL | Status: DC | PRN
  Filled 2020-11-02: qty 10

## 2020-11-02 MED ORDER — DIPHENHYDRAMINE HCL 50 MG/ML IJ SOLN
INTRAMUSCULAR | Status: AC
  Filled 2020-11-02: qty 1

## 2020-11-02 MED ORDER — ALBUMIN HUMAN 25 % IV SOLN
50.0000 g | Freq: Once | INTRAVENOUS | Status: AC
  Administered 2020-11-02: 50 g via INTRAVENOUS
  Filled 2020-11-02: qty 200

## 2020-11-02 MED ORDER — MIDAZOLAM HCL 2 MG/2ML IJ SOLN
2.0000 mg | INTRAMUSCULAR | Status: AC | PRN
  Administered 2020-11-02 – 2020-11-04 (×3): 2 mg via INTRAVENOUS
  Filled 2020-11-02 (×3): qty 2

## 2020-11-02 MED ORDER — FENTANYL CITRATE (PF) 100 MCG/2ML IJ SOLN
INTRAMUSCULAR | Status: AC
  Filled 2020-11-02: qty 4

## 2020-11-02 MED ORDER — FENTANYL CITRATE (PF) 100 MCG/2ML IJ SOLN: 50.0000 ug | Freq: Once | INTRAMUSCULAR | Status: DC

## 2020-11-02 MED ORDER — CHLORHEXIDINE GLUCONATE 0.12% ORAL RINSE (MEDLINE KIT)
15.0000 mL | Freq: Two times a day (BID) | OROMUCOSAL | Status: DC
  Administered 2020-11-02 – 2020-11-04 (×7): 15 mL via OROMUCOSAL

## 2020-11-02 MED ORDER — POLYETHYLENE GLYCOL 3350 17 G PO PACK
17.0000 g | PACK | Freq: Every day | ORAL | Status: DC
  Administered 2020-11-04: 17 g
  Filled 2020-11-02 (×2): qty 1

## 2020-11-02 MED ORDER — INSULIN ASPART 100 UNIT/ML ~~LOC~~ SOLN
0.0000 [IU] | SUBCUTANEOUS | Status: DC
  Administered 2020-11-03: 3 [IU] via SUBCUTANEOUS
  Administered 2020-11-03: 2 [IU] via SUBCUTANEOUS
  Administered 2020-11-03 – 2020-11-04 (×7): 3 [IU] via SUBCUTANEOUS
  Administered 2020-11-05: 2 [IU] via SUBCUTANEOUS

## 2020-11-02 MED ORDER — SODIUM CHLORIDE 0.9 % IV SOLN
8.0000 mg/h | INTRAVENOUS | Status: DC
  Administered 2020-11-02 – 2020-11-03 (×4): 8 mg/h via INTRAVENOUS
  Filled 2020-11-02 (×5): qty 80

## 2020-11-02 MED ORDER — HYDROCORTISONE NA SUCCINATE PF 100 MG IJ SOLR
100.0000 mg | Freq: Three times a day (TID) | INTRAMUSCULAR | Status: DC
  Administered 2020-11-02: 100 mg via INTRAVENOUS
  Filled 2020-11-02 (×2): qty 2

## 2020-11-02 MED ORDER — INSULIN ASPART 100 UNIT/ML ~~LOC~~ SOLN: 3.0000 [IU] | SUBCUTANEOUS | Status: DC

## 2020-11-02 MED ORDER — ONDANSETRON 4 MG PO TBDP: 4.0000 mg | ORAL_TABLET | Freq: Four times a day (QID) | ORAL | Status: DC | PRN

## 2020-11-02 MED ORDER — IOHEXOL 300 MG/ML  SOLN
100.0000 mL | Freq: Once | INTRAMUSCULAR | Status: AC | PRN
  Administered 2020-11-02: 60 mL via INTRA_ARTERIAL

## 2020-11-02 MED ORDER — ADULT MULTIVITAMIN LIQUID CH
15.0000 mL | Freq: Every day | ORAL | Status: DC
  Administered 2020-11-03 – 2020-11-04 (×2): 15 mL
  Filled 2020-11-02 (×2): qty 15

## 2020-11-02 MED ORDER — SODIUM CHLORIDE 0.9 % IV BOLUS
1000.0000 mL | Freq: Once | INTRAVENOUS | Status: AC
  Administered 2020-11-02: 1000 mL via INTRAVENOUS

## 2020-11-02 MED ORDER — VITAMIN K1 10 MG/ML IJ SOLN
10.0000 mg | Freq: Once | INTRAVENOUS | Status: AC
  Administered 2020-11-02: 10 mg via INTRAVENOUS
  Filled 2020-11-02: qty 1

## 2020-11-02 MED ORDER — SODIUM CHLORIDE 0.9% IV SOLUTION
Freq: Once | INTRAVENOUS | Status: AC
Start: 2020-11-02 — End: 2020-11-02

## 2020-11-02 MED ORDER — FENTANYL 2500MCG IN NS 250ML (10MCG/ML) PREMIX INFUSION
50.0000 ug/h | INTRAVENOUS | Status: DC
  Administered 2020-11-02: 50 ug/h via INTRAVENOUS
  Administered 2020-11-03 (×3): 150 ug/h via INTRAVENOUS
  Administered 2020-11-04: 200 ug/h via INTRAVENOUS
  Filled 2020-11-02 (×4): qty 250

## 2020-11-02 MED ORDER — MIDODRINE HCL 5 MG PO TABS
10.0000 mg | ORAL_TABLET | Freq: Three times a day (TID) | ORAL | Status: DC
  Administered 2020-11-03 – 2020-11-04 (×4): 10 mg
  Filled 2020-11-02 (×4): qty 2

## 2020-11-02 MED ORDER — CEFAZOLIN SODIUM-DEXTROSE 2-4 GM/100ML-% IV SOLN
2.0000 g | Freq: Three times a day (TID) | INTRAVENOUS | Status: DC
  Administered 2020-11-02 – 2020-11-05 (×8): 2 g via INTRAVENOUS
  Filled 2020-11-02 (×9): qty 100

## 2020-11-02 MED ORDER — EPINEPHRINE 1 MG/10ML IJ SOSY
PREFILLED_SYRINGE | INTRAMUSCULAR | Status: AC
  Filled 2020-11-02: qty 20

## 2020-11-02 MED FILL — Medication: Qty: 1 | Status: AC

## 2020-11-02 NOTE — Progress Notes (Signed)
Pt received a total of 4 Units PRBC, 1 unit of platelets, and 1 unit of plasma all emergently. Dr. Francine Graven at bedside

## 2020-11-02 NOTE — Consult Note (Signed)
Chief Complaint: Patient was seen in consultation today for  Chief Complaint  Patient presents with  . Weakness   at the request of Dewald,Jonathan B  Referring Physician(s): Dewald,Jonathan B  Patient Status: [facility] - In-pt  History of Present Illness: Ronald Brady is a 60 y.o. male who was initially admitted on 10/31/2020 for treatment of severe sepsis related to right thigh and left forearm abscess, has developed acute life threatening GI bleed.  Upper endoscopy demonstrated blood in the esophagus, stomach and duodenum with multiple duodenal ulcers.  Interventional radiology consulted for emergent embolization.  The patient could not be interviewed due to intubated status.    Past Medical History:  Diagnosis Date  . Diabetes mellitus without complication (HCC)   . Emphysema lung (HCC)   . Hypertension     Past Surgical History:  Procedure Laterality Date  . ABDOMINAL SURGERY    . DRESSING CHANGE UNDER ANESTHESIA Left 10/29/2020   Procedure: REAPPLICATION OF LEFT WRIST DRESSING, PACKED;  Surgeon: Eldred Manges, MD;  Location: [facility];  Service: Orthopedics;  Laterality: Left;  . I & D EXTREMITY Right 11/15/2020   Procedure: IRRIGATION AND DEBRIDEMENT DISTAL THIGH WITH WOUND VAC REMOVAL & APPLICATION;  Surgeon: Eldred Manges, MD;  Location: [facility];  Service: Orthopedics;  Laterality: Right;  . INCISION AND DRAINAGE Left 19-Nov-2020   Procedure: INCISION AND DRAINAGE  AND DEBRIDEMENT LEFT WRIST AND RIGHT POSTERIOR THIGH APPLICATION OF WOUND VACS TO LEFT WRIST WOUND AND RIGHT POSTERIOR THIGH WOUND;  Surgeon: Eldred Manges, MD;  Location: [facility];  Service: Orthopedics;  Laterality: Left;    Allergies: Codeine and Penicillins  Medications: Prior to Admission medications   Medication Sig Start Date End Date Taking? Authorizing Provider  albuterol (PROAIR HFA) 108 (90 Base) MCG/ACT inhaler Inhale 1 puff into the lungs every 6 (six) hours as needed for wheezing or shortness of breath.    Yes [provider]  budesonide-formoterol (SYMBICORT) 160-4.5 MCG/ACT inhaler Inhale 2 puffs into the lungs 2 (two) times daily as needed (shortness of breath/wheezing).   Yes [provider]  insulin isophane & regular human (HUMULIN 70/30 KWIKPEN) (70-30) 100 UNIT/ML KwikPen Inject 67 Units into the skin 3 (three) times daily.   Yes [provider]  metFORMIN (GLUCOPHAGE) 1000 MG tablet Take 1,000 mg by mouth 2 (two) times daily. 09/21/20  Yes [provider]  pravastatin (PRAVACHOL) 20 MG tablet Take 20 mg by mouth at bedtime. 05/26/19  Yes [provider]  HYDROcodone-acetaminophen (NORCO) 5-325 MG tablet Take 1 tablet by mouth every 6 (six) hours as needed. Patient not taking: No sig reported 09/28/20   Adonis Huguenin, NP  ibuprofen (ADVIL) 600 MG tablet Take 1 tablet (600 mg total) by mouth every 8 (eight) hours as needed for moderate pain. Patient not taking: No sig reported 09/05/20   Terrilee Files, MD  meloxicam (MOBIC) 15 MG tablet Take 1 tablet (15 mg total) by mouth daily. Patient not taking: No sig reported 09/28/20   Adonis Huguenin, NP  nicotine (NICODERM CQ - DOSED IN MG/24 HOURS) 14 mg/24hr patch Place 1 patch (14 mg total) onto the skin daily. Patient not taking: No sig reported 08/23/19   Joycelyn Das, MD    History reviewed. No pertinent family history.  Social History   Socioeconomic History  . Marital status: Single    Spouse name: Not on file  . Number of children: Not on file  . Years of education:  Not on file  . Highest education level: Not on file  Occupational History  . Not on file  Tobacco Use  . Smoking status: Current Every Day Smoker    Packs/day: 1.50  . Smokeless tobacco: Never Used  Substance and Sexual Activity  . Alcohol use: Never  . Drug use: Never  . Sexual activity: Not on file  Other Topics Concern  . Not on file  Social History Narrative  . Not on file   Social Determinants of Health    Financial Resource Strain: Not on file  Food Insecurity: Not on file  Transportation Needs: Not on file  Physical Activity: Not on file  Stress: Not on file  Social Connections: Not on file   Review of Systems: A 12 point ROS discussed and pertinent positives are indicated in the HPI above.  All other systems are negative.  Review of Systems  Vital Signs: BP (!) 55/35   Pulse (!) 118   Temp (!) 95.72 F (35.4 C)   Resp (!) 24   Ht 5\' 5"  (1.651 m)   Wt 75.7 kg   SpO2 94%   BMI 27.77 kg/m   Physical Exam  Imaging: DG Forearm Left  Result Date: 11/11/2020 CLINICAL DATA:  Infected wound to the distal left forearm. EXAM: LEFT FOREARM - 2 VIEW COMPARISON:  09/05/2020 FINDINGS: Intravenous catheter in the antecubital fossa region. Decreased soft tissue swelling since previous study. No soft tissue gas. No evidence of acute fracture or dislocation. No focal bone lesion or bone destruction. Degenerative changes in the wrist. IMPRESSION: Decreased soft tissue swelling since previous study. No acute bony abnormalities. Electronically Signed   By: 09/07/2020 M.D.   On: 11/10/2020 20:09   CT WRIST LEFT WO CONTRAST  Result Date: 10/30/2020 CLINICAL DATA:  Left wrist draining abscess.  Sepsis. EXAM: CT OF THE LEFT WRIST WITHOUT CONTRAST TECHNIQUE: Multidetector CT imaging was performed according to the standard protocol. Multiplanar CT image reconstructions were also generated. COMPARISON:  Plain films 11/12/2020 FINDINGS: There is soft tissue swelling noted along the anterior aspect of the wrist and distal forearm. Edema noted throughout the tissues without well-defined focal abscess. There are locules of gas within the soft tissues. Findings most suggestive of cellulitis. No acute bony abnormality. No bone destruction to suggest osteomyelitis. IMPRESSION: Edema and locules of gas throughout the subcutaneous soft tissues in the anterior distal forearm. No well-defined focal abscess. This  could be further evaluated with ultrasound if felt clinically indicated. Electronically Signed   By: 12/22/2020 M.D.   On: 11/04/2020 00:36   CT FEMUR RIGHT WO CONTRAST  Result Date: 10/25/2020 CLINICAL DATA:  Hamstrings abscess assess collection EXAM: CT OF THE LOWER RIGHT EXTREMITY WITHOUT CONTRAST TECHNIQUE: Multidetector CT imaging of the right lower extremity was performed according to the standard protocol. COMPARISON:  October 22, 2020 FINDINGS: Bones/Joint/Cartilage No areas of periosteal reaction or cortical destruction is seen. No fracture or dislocation. The articular surfaces appear to be maintained. Ligaments Suboptimally assessed by CT. Muscles and Tendons Within the hamstrings musculature the of the level of the mid to distal fever there remains a multilocular collection containing foci of gas measuring approximately 5.4 x 3.7 x 6.0 cm. Overlying subcutaneous emphysema and postsurgical changes are noted along the posterior upper thigh. Soft tissues As described above there is postsurgical changes seen within the posterior upper thigh. IMPRESSION: There remains a multilocular fluid collection within the lower hamstrings muscles at the level of the mid to distal  femur with foci of subcutaneous emphysema measuring 5.4 x 3.7 x 3 x 6.0 cm. Electronically Signed   By: Jonna ClarkBindu  Avutu M.D.   On: 10/25/2020 19:22   CT FEMUR RIGHT WO CONTRAST  Result Date: 11/03/2020 CLINICAL DATA:  Pain and swelling in right thigh. Soft tissue infection suspected. EXAM: CT OF THE LOWER RIGHT EXTREMITY WITHOUT CONTRAST TECHNIQUE: Multidetector CT imaging of the right lower extremity was performed according to the standard protocol. COMPARISON:  None. FINDINGS: Foley catheter present in the bladder. Large stool burden in the visualized rectum raising the possibility of fecal impaction. Recommend clinical correlation. Mild prostate enlargement with calcifications. Large soft tissue defect noted in the posteromedial upper right  thigh. The defect extends down to the muscles. Gas and fluid noted within the soft tissues along the surface of the hamstring muscles extending from the soft tissue defect in the proximal thigh distally to just above the knee concerning for abscess. It is difficult to determine if this is within the hamstring muscles or along the surface of the muscle. Largest collection of gas and fluid seen on axial image 187 of series 7 measures approximately 3.3 x 2.1 cm. There are some locules of gas which extend deep into the musculature (image 166, 154, 188) compatible with intramuscular abscesses. Extensive vascular calcifications. No acute bony abnormality. No fracture. No bone destruction to suggest osteomyelitis. IMPRESSION: Elongated gas and fluid collection noted along the surface of the hamstring muscles, in some areas extending deep into the hamstring muscles compatible with intramuscular abscess. This extends over an extensive area in length extending from the soft tissue defect in the proximal posteromedial right thigh distally to just above the knee. No evidence of osteomyelitis. Electronically Signed   By: Charlett NoseKevin  Dover M.D.   On: 11/13/2020 20:25   DG CHEST PORT 1 VIEW  Result Date: 10/31/2020 CLINICAL DATA:  Intubated EXAM: PORTABLE CHEST 1 VIEW COMPARISON:  10/29/2020, 10/21/2020 FINDINGS: Interval intubation, tip of the endotracheal tube about a cm superior to the carina. Right upper extremity central venous catheter tip over the SVC. Esophageal tube tip below the diaphragm but incompletely visualized. Slightly improved aeration at the bases. Hazy left lung base opacity may reflect pleural fluid, atelectasis or minimal infiltrate. IMPRESSION: Interval intubation with tip of the endotracheal tube about a cm superior to the carina. Slightly improved aeration at the bases. Electronically Signed   By: Jasmine PangKim  Fujinaga M.D.   On: 10/30/2020 00:43   DG CHEST PORT 1 VIEW  Result Date: 11/19/2020 CLINICAL DATA:   Bacteremia. EXAM: PORTABLE CHEST 1 VIEW COMPARISON:  Chest x-ray 10/25/2020. FINDINGS: Right IJ line in stable position. Heart size normal. Low lung volumes. Bibasilar infiltrates. Small left pleural effusion cannot be excluded. No pneumothorax. IMPRESSION: 1.  Right IJ line stable position. 2. Low lung volumes with bibasilar infiltrates. Small left pleural effusion cannot be excluded. Electronically Signed   By: Maisie Fushomas  Register   On: 10/26/2020 05:21   DG Chest Portable 1 View  Result Date: 10/28/2020 CLINICAL DATA:  Dyspnea. EXAM: PORTABLE CHEST 1 VIEW COMPARISON:  10/26/2020 FINDINGS: Right IJ central venous catheter unchanged. Lungs are adequately inflated without focal airspace consolidation, effusion or pneumothorax. Cardiomediastinal silhouette and remainder of the exam is unchanged. IMPRESSION: No active disease. Electronically Signed   By: Elberta Fortisaniel  Boyle M.D.   On: 11/10/2020 14:05   DG Chest Portable 1 View  Result Date: 11/15/2020 CLINICAL DATA:  Central line placement EXAM: PORTABLE CHEST 1 VIEW COMPARISON:  Chest radiograph from  one day prior. FINDINGS: Right internal jugular central venous catheter terminates at the cavoatrial junction. Stable cardiomediastinal silhouette with normal heart size. No pneumothorax. No pleural effusion. Lungs appear clear, with no acute consolidative airspace disease and no pulmonary edema. Healed deformities in multiple posterior right mid ribs. IMPRESSION: Right internal jugular central venous catheter terminates at the cavoatrial junction. No pneumothorax. No active cardiopulmonary disease. Electronically Signed   By: Delbert Phenix M.D.   On: 10/31/2020 06:31   DG Chest Port 1 View  Result Date: 11/13/2020 CLINICAL DATA:  60 year old male with weakness. EXAM: PORTABLE CHEST 1 VIEW COMPARISON:  Chest radiograph dated 08/18/2019 FINDINGS: Background of chronic interstitial coarsening and bronchitic changes. No focal consolidation, pleural effusion, or  pneumothorax. The cardiac silhouette is within limits. No acute osseous pathology. IMPRESSION: No acute cardiopulmonary process. Electronically Signed   By: Elgie Collard M.D.   On: 11/10/2020 17:09   ECHOCARDIOGRAM COMPLETE  Result Date: 10/24/2020    ECHOCARDIOGRAM REPORT   Patient Name:   LAKE CINQUEMANI [facility] Date of Exam: 10/24/2020 Medical Rec #:  [id number]         Height:       65.0 in Accession #:    [id number]        Weight:       166.9 lb Date of Birth:  [birth year]         BSA:          1.832 m Patient Age:    60 years          BP:           93/66 mmHg Patient Gender: M                 HR:           100 bpm. Exam Location:  Inpatient Procedure: 2D Echo, Color Doppler and Cardiac Doppler Indications:    Bacteremia R78.81  History:        Patient has no prior history of Echocardiogram examinations.                 Emphysema. Severe sepsis. Soft tissue infection-abscess. MSSA                 bacteremia.  Sonographer:    Leta Jungling RDCS Referring Phys: [phone] MARK C YATES IMPRESSIONS  1. Left ventricular ejection fraction, by estimation, is 60 to 65%. The left ventricle has normal function. The left ventricle has no regional wall motion abnormalities. Left ventricular diastolic parameters were normal.  2. Right ventricular systolic function is normal. The right ventricular size is normal. There is normal pulmonary artery systolic pressure.  3. The mitral valve is abnormal. No evidence of mitral valve regurgitation. No evidence of mitral stenosis.  4. The aortic valve is tricuspid. There is mild calcification of the aortic valve. Aortic valve regurgitation is not visualized. Mild to moderate aortic valve sclerosis/calcification is present, without any evidence of aortic stenosis.  5. The inferior vena cava is normal in size with greater than 50% respiratory variability, suggesting right atrial pressure of 3 mmHg. FINDINGS  Left Ventricle: Left ventricular ejection fraction, by estimation, is 60 to 65%. The  left ventricle has normal function. The left ventricle has no regional wall motion abnormalities. The left ventricular internal cavity size was normal in size. There is  no left ventricular hypertrophy. Left ventricular diastolic parameters were normal. Right Ventricle: The right ventricular size is normal. No increase in right ventricular wall thickness. Right  ventricular systolic function is normal. There is normal pulmonary artery systolic pressure. The tricuspid regurgitant velocity is 2.33 m/s, and  with an assumed right atrial pressure of 3 mmHg, the estimated right ventricular systolic pressure is 24.7 mmHg. Left Atrium: Left atrial size was normal in size. Right Atrium: Right atrial size was normal in size. Pericardium: There is no evidence of pericardial effusion. Mitral Valve: The mitral valve is abnormal. There is mild thickening of the mitral valve leaflet(s). There is mild calcification of the mitral valve leaflet(s). No evidence of mitral valve regurgitation. No evidence of mitral valve stenosis. Tricuspid Valve: The tricuspid valve is normal in structure. Tricuspid valve regurgitation is trivial. No evidence of tricuspid stenosis. Aortic Valve: The aortic valve is tricuspid. There is mild calcification of the aortic valve. Aortic valve regurgitation is not visualized. Mild to moderate aortic valve sclerosis/calcification is present, without any evidence of aortic stenosis. Pulmonic Valve: The pulmonic valve was not well visualized. Pulmonic valve regurgitation is not visualized. No evidence of pulmonic stenosis. Aorta: The aortic root is normal in size and structure. Venous: The inferior vena cava is normal in size with greater than 50% respiratory variability, suggesting right atrial pressure of 3 mmHg. IAS/Shunts: No atrial level shunt detected by color flow Doppler.  LEFT VENTRICLE PLAX 2D LVIDd:         4.50 cm  Diastology LVIDs:         3.20 cm  LV e' medial:    9.90 cm/s LV PW:         0.90 cm   LV E/e' medial:  8.8 LV IVS:        0.80 cm  LV e' lateral:   12.60 cm/s LVOT diam:     2.00 cm  LV E/e' lateral: 6.9 LV SV:         71 LV SV Index:   39 LVOT Area:     3.14 cm  RIGHT VENTRICLE RV S prime:     14.60 cm/s TAPSE (M-mode): 2.2 cm LEFT ATRIUM             Index       RIGHT ATRIUM           Index LA diam:        3.50 cm 1.91 cm/m  RA Area:     11.30 cm LA Vol (A2C):   19.6 ml 10.70 ml/m RA Volume:   23.50 ml  12.83 ml/m LA Vol (A4C):   17.0 ml 9.28 ml/m LA Biplane Vol: 18.2 ml 9.94 ml/m  AORTIC VALVE LVOT Vmax:   131.00 cm/s LVOT Vmean:  87.700 cm/s LVOT VTI:    0.225 m  AORTA Ao Root diam: 3.30 cm Ao Asc diam:  2.50 cm MITRAL VALVE               TRICUSPID VALVE MV Area (PHT): 3.27 cm    TR Peak grad:   21.7 mmHg MV Decel Time: 232 msec    TR Vmax:        233.00 cm/s MV E velocity: 87.00 cm/s MV A velocity: 71.10 cm/s  SHUNTS MV E/A ratio:  1.22        Systemic VTI:  0.22 m                            Systemic Diam: 2.00 cm Charlton Haws MD Electronically signed by Charlton Haws MD Signature Date/Time: 10/24/2020/3:25:23 PM  Final    [place] EKG SITE RITE  Result Date: 10/28/2020 If Site Rite image not attached, placement could not be confirmed due to current cardiac rhythm.  CT Angio Abd/Pel w/ and/or w/o  Result Date: 10/26/2020 CLINICAL DATA:  GI bleed. Abdominal pain. Severe sepsis and septic shock. EXAM: CTA ABDOMEN AND PELVIS WITHOUT AND WITH CONTRAST TECHNIQUE: Multidetector CT imaging of the abdomen and pelvis was performed using the standard protocol during bolus administration of intravenous contrast. Multiplanar reconstructed images and MIPs were obtained and reviewed to evaluate the vascular anatomy. CONTRAST:  OMNIPAQUE IOHEXOL 350 MG/ML SOLN COMPARISON:  None. FINDINGS: VASCULAR Aorta: Normal caliber aorta without aneurysm, dissection, vasculitis or significant stenosis. Celiac: Patent without evidence of aneurysm, dissection, vasculitis or significant stenosis. SMA: Patent  without evidence of aneurysm, dissection, vasculitis or significant stenosis. Renals: Both renal arteries are patent without evidence of aneurysm, dissection, vasculitis, fibromuscular dysplasia or significant stenosis. IMA: Patent without evidence of aneurysm, dissection, vasculitis or significant stenosis. Inflow: Patent without evidence of aneurysm, dissection, vasculitis or significant stenosis. Proximal Outflow: Bilateral common femoral and visualized portions of the superficial and profunda femoral arteries are patent without evidence of aneurysm, dissection, vasculitis or significant stenosis. Veins: No obvious venous abnormality within the limitations of this arterial phase study. Review of the MIP images confirms the above findings. NON-VASCULAR Lower chest: There is small bilateral pleural effusions. There is adjacent atelectasis.The heart size is normal. The intracardiac blood pool is hypodense relative to the adjacent myocardium consistent with anemia. Hepatobiliary: The liver is normal. Normal gallbladder.There is no biliary ductal dilation. Pancreas: Normal contours without ductal dilatation. No peripancreatic fluid collection. Spleen: Unremarkable. Adrenals/Urinary Tract: --Adrenal glands: Unremarkable. --Right kidney/ureter: No hydronephrosis or radiopaque kidney stones. --Left kidney/ureter: No hydronephrosis or radiopaque kidney stones. --Urinary bladder: The urinary bladder is significantly distended. Stomach/Bowel: --Stomach/Duodenum: There is suggestion of diffuse gastric wall thickening. The distal esophagus appears to be thickened with intraluminal fluid. 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 There appears to be diffuse wall thickening of the duodenum. --Colon: There is mild wall thickening of the rectum. There is no evidence for active arterial GI bleeding. --Appendix: Normal. Vascular/Lymphatic: Atherosclerotic calcification is present within the non-aneurysmal  abdominal aorta, without hemodynamically significant stenosis. --No retroperitoneal lymphadenopathy. --No mesenteric lymphadenopathy. --No pelvic or inguinal lymphadenopathy. Reproductive: Unremarkable Other: There is a small volume of free fluid in the abdomen. There is anasarca. There is a small bowel containing umbilical hernia without evidence for obstruction. Musculoskeletal. 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 IMPRESSION: 1. No evidence for active arterial GI bleeding. 2. Diffuse gastric wall thickening and duodenal wall thickening, suggestive of gastritis and duodenitis. Incidentally noted small bowel malrotation. 3. Small bilateral pleural effusions with adjacent atelectasis. 4. Anasarca. 5. Distended urinary bladder. Aortic Atherosclerosis (ICD10-I70.0). Electronically Signed   By: Katherine Mantle M.D.   On: 10/26/2020 23:27    Labs:  CBC: Recent Labs    10/29/20 0300 11/10/2020 0456 11/19/2020 1511 11/17/2020 2205 11/04/2020 0105  WBC 6.3 5.1 6.0  --  20.1*  HGB 7.8* 4.6* 7.2* 3.8* 6.9*  HCT 23.8* 14.8* 22.2* 12.4* 21.8*  PLT 121* 169 198  --  140*    COAGS: Recent Labs    10/26/20 0227 10/27/2020 0105  INR 1.4* 2.3*  APTT 29  --     BMP: Recent Labs    10/28/20 0700 10/29/20 0300 10/30/20 0606 11/04/2020 0456  NA 136  137 135 139  K 3.9 2.8* 4.1 3.1*  CL 105 104 104 108  CO2 GLUCOSE 223* 127* 179* 124*  BUN 18 21* 26* 25*  CALCIUM 7.1* 6.8* 6.7* 7.5*  CREATININE 0.66 0.62 0.64 0.62  GFRNONAA >60 >60 >60 >60    LIVER FUNCTION TESTS: Recent Labs    10/25/2020 1612 10/26/20 0353 11/16/2020 0456  BILITOT 1.1 0.7 <0.1*  AST 14* 11* 11*  ALT 13 7 <5  ALKPHOS 91 107 109  PROT 7.1 3.6* 4.4*  ALBUMIN 2.1* 1.2* 1.2*    TUMOR MARKERS: No results for input(s): AFPTM, CEA, CA199, CHROMGRNA in the last 8760 hours.  Assessment and Plan:  60 year old male with acute life threatening upper GI bleed.  IR  consulted for angiogram and possible embolization.  Upper endoscopy shows blood within the esophagus, stomach, and duodenum with multiple duodenal ulcers.  We will plan on performing angiogram of the Celiac and Superior Mesenteric Arteries and embolize any sites of active bleeding.  If no active site of bleeding is identified, I will embolize the Gastroduodenal artery.  Risks and benefits of the procedure were discussed with his sister Marlowe Aschoff including, but not limited to bleeding, infection, vascular injury or contrast induced renal failure.  This interventional procedure involves the use of X-rays and because of the nature of the planned procedure, it is possible that we will have prolonged use of X-ray fluoroscopy.  Potential radiation risks to you include (but are not limited to) the following: - A slightly elevated risk for cancer  several years later in life. This risk is typically less than 0.5% percent. This risk is low in comparison to the normal incidence of human cancer, which is 33% for women and 50% for men according to the [employer] Society. - Radiation induced injury can include skin redness, resembling a rash, tissue breakdown / ulcers and hair loss (which can be temporary or permanent).   The likelihood of either of these occurring depends on the difficulty of the procedure and whether you are sensitive to radiation due to previous procedures, disease, or genetic conditions.   IF your procedure requires a prolonged use of radiation, you will be notified and given written instructions for further action.  It is your responsibility to monitor the irradiated area for the 2 weeks following the procedure and to notify your physician if you are concerned that you have suffered a radiation induced injury.    All of the patient's sister's questions were answered, and she is agreeable to proceed.  Consent signed and in chart.   Thank you for this interesting consult.  I  greatly enjoyed meeting Asar Evilsizer [facility] and look forward to participating in their care.  A copy of this report was sent to the requesting provider on this date.  Electronically Signed: Al Corpus Korena Nass, MD 11/04/2020, 3:10 AM   I spent a total of 20 Minutes in face to face in clinical consultation, greater than 50% of which was counseling/coordinating care for acute life threatening GI bleed.

## 2020-11-02 NOTE — Procedures (Signed)
Interventional Radiology Procedure Note  Procedure: Celiac and Superior Mesenteric Angiogram and Embolization of active extravasation from GDA branch.  Indication: Acute upper GI bleed  Findings: Active extravasation seen from GDA branch.  Selected coil embolization of the this branch of the GDA was performed.  No extravasation seen post coiling.    Complications: None immediate  EBL: < 10 mL  Acquanetta Belling, MD [phone]

## 2020-11-02 NOTE — Procedures (Signed)
Arterial Catheter Insertion Procedure Note  Ronald Brady  [id number]  1960/07/28  Date:10/23/2020  Time:12:08 AM    Provider Performing: Martina Sinner    Procedure: Insertion of Arterial Line (91478) with US guidance (29562)   Indication(s) Blood pressure monitoring and/or need for frequent ABGs  Consent Unable to obtain consent due to emergent nature of procedure.  Anesthesia None   Time Out Verified patient identification, verified procedure, site/side was marked, verified correct patient position, special equipment/implants available, medications/allergies/relevant history reviewed, required imaging and test results available.   Sterile Technique Maximal sterile technique including full sterile barrier drape, hand hygiene, sterile gown, sterile gloves, mask, hair covering, sterile ultrasound probe cover (if used).   Procedure Description Area of catheter insertion was cleaned with chlorhexidine and draped in sterile fashion. With real-time ultrasound guidance an arterial catheter was placed into the left femoral artery.  Appropriate arterial tracings confirmed on monitor.     Complications/Tolerance None; patient tolerated the procedure well.   EBL Minimal   Specimen(s) None

## 2020-11-02 NOTE — Progress Notes (Signed)
NAME:  Ronald Brady, MRN:  [id number], DOB:  18-Oct-1960, LOS: 11 ADMISSION DATE:  11/10/2020, CONSULTATION DATE:  4/3 REFERRING MD:  [facility], CHIEF COMPLAINT:  sepsis   History of Present Illness:  61 y/o M, smoker, with poorly controlled DM who presented to [facility] 4/2 after being found at home in a chair covered in urine & feces.  Seen with multiple wounds. Remains in the [facility] ICU in critical condition.  Pertinent  Medical History  Tobacco Abuse - began smoking at 64 Worked as a logger  Poorly controlled DM - Hgb A1c 11.8 in 07/2019  HTN   Significant Hospital Events: Including procedures, antibiotic start and stop dates in addition to other pertinent events   4/02 presented to ER in shock due to multiple abscess (right thigh, left wrist), DKA. CT femur found elongated gas and fluid collection in the right hamstring c/w abscess. No evidence of osteomyelitis. Treated with clinda, meropenem, cultured, central line placed. I&D in ER.  4/03 Wrist CT with gas throughout the SQ tissues in the anterior distal forearm. Ortho consulted > taken to OR for debridement of right posterior hamstring and left forearm. Intraoperative cultures obtained. Cultures growing staph aureus (sensitivities pending). Vanco added but abx narrowed to cefazolin late pm 4/04 Ortho note reflects concern for poor wound healing, may need hip disarticulation. On levophed 1-2 mcg, BCID with staph aureus, UC 100k staph aureus.  ECHO 60-65% EF, no evidence of vegetation mentioned 4/5 CT Femur w/o contrast with multilocular fluid collection in the lower hamstrings muscles at the level of the mid to distal femur with foci of SQ emphysema 4/6 GIB overnight, Hgb down to 5.9, tx 2 units PRBC's, 1 unit FFP, PPI gtt initiated + IVF.  Follow up Hgb 7.3, remains on low dose pressors. GI consulted.  4/8 Repeat I&D of left wrist and right thigh, extensive purulent material with new abscess seen near pelvis  4/9 weaning pressors, decreased  meal time insulin 4/10 intermittent hypotension, stop diuresis 4/11 on pressors, MAP above goal, wean 4/12 almost off NE, I&D of abscess rt knee and hamstring, L FA dressing change PM 4/12-4/13: Hypotensive, increased NE, HGB drop to 3.8, large melanotic stool, intubated, fem CVC placement, aline placement, OGT bright red blood. Total 10 PRBC, 4 FFP, 2 plt per chart. 1 dose ceftriaxone. EGD Marca Ancona MD) Non bleeding duodenal ulcer, injected, suctioned 1350 of fresh blood with cont bleeding. IR Active extrav from Gastroduodenal artery branch, coil and embo completed. Large volume resuscitation.   Interim History / Subjective:  Went to OR in the afternoon with Ortho/ID for RT knee and hamstring abscess I&D and L FA dressing change. Post procedure became hypotensive with increased levophed requirements. HGB found to be 3.8 with large melanotic stool and BRB in OGT. Blood started. Dr. Francine Graven placed ETT, CVC, ALINE. GI completed EGD, found non bleeding duodenal ulcer and injected. IR consulted, angiogram of celiac and SMA completed, active extrav found in GDA branch, coiled and embolized.   Total 9 PRBC, 3 FFP, 1PLT given per chart. 16 total per nursing 10 PRBC, 4 FFP, 2 PLT.  Remains intubated  Neo 20 mcg, Levo 14 mcg, Vaso 0.03  24 Hour documented vitals: 54/23 to 147/86, HR 46 to 118, RR 13 to 25, T Max 98.1  Objective   Blood pressure 121/75, pulse 87, temperature (!) 91.58 F (33.1 C), resp. rate (!) 26, height 5\' 5"  (1.651 m), weight 75.7 kg, SpO2 100 %. CVP:  [6 mmHg] 6  mmHg  Vent Mode: PRVC FiO2 (%):  [60 %-100 %] 60 % Set Rate:  [18 bmp-24 bmp] 24 bmp Vt Set:  [480 mL] 480 mL PEEP:  [5 cmH20-8 cmH20] 8 cmH20 Plateau Pressure:  [13 cmH20] 13 cmH20   Intake/Output Summary (Last 24 hours) at 11/12/2020 0758 Last data filed at 10/26/2020 0734 Gross per 24 hour  Intake 3667.92 ml  Output 50 ml  Net 3617.92 ml   Filed Weights   11/22/20 1510 10/24/20 0515 11/10/2020 1740  Weight: 70 kg  75.7 kg 75.7 kg    Examination: General: Intubated, ill appearing, uncomfortable HEENT: MM pink/moist, atraumatic, scleral edema, anicteric, ETT, OGT Neuro: Tracking, MAE, Purposeful, eyes open spontaneously, RASS 0  CV: s1s2, sinus tach, no m/r/g apreciated PULM: Upper lobes clear, diminished in bases, chest expansion symmetric, air movement throughout GI: soft, bsx4 hypoactive, rounded, clear by bright red tinged OGT output Extremities: Dry, distal extremities cool, 2+ pretibial edema, cap refill less than 3 seconds,   Skin: X3 wound vacs to RLE, serosanguinous output, ace bandage to FA.  Labs/imaging that I have  ABG, CBC, INR  Resolved Hospital Problem list     Assessment & Plan:   Hemorraghic Shock, secondary to GIB Duodenal Ulcer Acute Blood Loss Anemia  Active extrav from Gastroduodenal artery branch. HGB 7.2>3.8>6.9 -Follow up post transfusion CBC and INR, goal HGB greater than seven, will consider if new active bleeding with further HD instability -Will follow up Calcium level after multiple transfusions, replace as indicated -Continue Vasopressors, titrate to goal MAP of 65 -Currently hypothermic, goal normothermia, continue rewarming with bair hugger, rewarm 1 degree an hour. -Appreciate IR and GI assistance, will follow recommendations -Continue octreotide, vasopressin, and protonix -Stop stress dose steroids.   Septic Shock secondary MSSA Abscess of R Thigh, L Wrist with MSSA, MSSA bacteremia,  component of hypovolemia -Ortho planned VAC changes 3 times a week for thigh, knee wound vac -Antibiotics broadened to Ceftriaxone overnight during instability, ID switched back to cefazolin this AM. Appreciate ID assistance.  -TEE planned for Thursday, unsure if will be able to complete -Continue vasopressors as above  Acute Metabolic Encephalopathy Suspect hypoxia secondary to anemia  -Start PAD bundle with Fentanyl gtt and versed pushes. Goal rass 0 to -1. Wean sedation  to goal. -Daily SAT  Acute Respiratory Failure with Hypoxia Acute anemia and need for airway protection -Continue mechanical ventilation with low tidal volume strategy -Daily SAT/SBT, wean ventilator as able -Continue PAD bundle as discussed -Continue VAP bundle with pulmonary hygiene and oral care  Poorly Controlled DM with Hyperglycemia Hyperglycemic on admit with glucose >900, small ketones in urine, A1C 12.9 -Continue SSI -Will transition lantus back to 10U at night while patient is NPO -Continue monitoring BG q6H -Solu-Cortef discontinued.    Hypokalemia, in setting of lasix, poor PO intake -Follow up morning BMP, will replete and replace as indicated   Active Smoker, Suspected COPD / Asthmatic Bronchitis -Continue Duoneb and ICS  Best practice   Diet:  NPO Pain/Anxiety/Delirium protocol (if indicated): Yes (RASS goal 0) VAP protocol (if indicated): Yes DVT prophylaxis: SCD given recent GI bleed and need for pressors GI prophylaxis: PPI Glucose control:  SSI Yes and Basal insulin Yes Central venous access:  Yes, and it is still needed Arterial line:  Yes, and it is still needed Foley:  Yes, and it is still needed Mobility:  bed rest  PT consulted: Yes Last date of multidisciplinary goals of care discussion: Spoke with Robin at bedside  4/13 Code Status:  full code Disposition: ICU    CRITICAL CARE Performed by: Eliezer Champagne Total critical care time: 87 Minutes  Lynnwood Beckford Haywood Lasso., MSN, APRN, AGACNP-BC [facility]  10/21/2020 , 7:58 AM   Please see [email or web address] for pager details  From 7a-7p if no response, please call [phone] After hours, please call Elink at [phone]

## 2020-11-02 NOTE — Progress Notes (Signed)
[place] for Infectious Disease  Date of Admission:  11/06/2020           Reason for visit: Follow up on MSSA bacteremia  Current antibiotics: Day 11 cefazolin (4/3--present)   ASSESSMENT:    MSSA bacteremia: In the setting of right lower extremity and left upper extremity abscesses.  Status post I&D 10/25/2020, 10/31/2020, and 11/10/2020.  Operative note yesterday reports continued purulent fluid with right lower extremity abscess.  Also underwent excisional debridement of left forearm tissue. Repeat blood cultures negative from 10/24/2020 and TTE w/o evidence of vegetation.  TEE planned for Thursday 11/03/2020 Multifactorial shock: Related to GI bleed and sepsis GI bleed: Related to bleeding duodenal ulcers.  Status post emergent EGD with epi injection and hemospray.  Status post subsequent embolization to GDA branch by interventional radiology 11/13/2020 Type 2 DM:  A1c of 12.9 Respiratory failure: Intubated overnight 11/06/2020  PLAN:    Discontinue ceftriaxone and resume cefazolin 2 g every 8 hours for more targeted MSSA coverage TEE tentatively planned for tomorrow but may be delayed due to overnight events Vasopressors, blood products, GI bleed management per ICU and GI Glycemic control, wound care   Principal Problem:   Severe sepsis with septic shock [facility]) Active Problems:   Abscess of right thigh   Abscess of left forearm   Diabetes mellitus ([place])   Hyperosmolar hyperglycemic state (HHS) ([place])   AKI (acute kidney injury) ([place])   Acute lower UTI   Emphysema lung ([place])   Abscess of right lower extremity   MSSA bacteremia   Pressure injury of skin   Malnutrition of moderate degree    MEDICATIONS:    Scheduled Meds: . (feeding supplement) PROSource Plus  30 mL Oral BID BM  . chlorhexidine gluconate (MEDLINE KIT)  15 mL Mouth Rinse BID  . Chlorhexidine Gluconate Cloth  6 each Topical Daily  . docusate  100 mg Per Tube BID  . feeding supplement (GLUCERNA SHAKE)   237 mL Oral BID BM  . fentaNYL (SUBLIMAZE) injection  50 mcg Intravenous Once  . hydrocortisone sod succinate (SOLU-CORTEF) inj  100 mg Intravenous Q8H  . insulin aspart  0-15 Units Subcutaneous TID WC  . insulin aspart  6 Units Subcutaneous TID WC  . insulin glargine  20 Units Subcutaneous QHS  . ipratropium-albuterol  3 mL Nebulization BID  . lidocaine (PF)      . mouth rinse  15 mL Mouth Rinse 10 times per day  . midodrine  10 mg Oral TID WC  . mometasone-formoterol  2 puff Inhalation BID  . multivitamin with minerals  1 tablet Oral Daily  . nicotine  21 mg Transdermal Daily  . phenylephrine      . polyethylene glycol  17 g Per Tube Daily  . sodium chloride flush  10-40 mL Intracatheter Q12H   Continuous Infusions: .  ceFAZolin (ANCEF) IV    . fentaNYL infusion INTRAVENOUS 100 mcg/hr (11/14/2020 1103)  . norepinephrine (LEVOPHED) Adult infusion 40 mcg/min (11/07/2020 0126)  . octreotide  (SANDOSTATIN)    IV infusion 50 mcg/hr (11/15/2020 1103)  . pantoprozole (PROTONIX) infusion 8 mg/hr (10/21/2020 1103)  . phenylephrine (NEO-SYNEPHRINE) Adult infusion 0 mcg/min (11/18/2020 0730)  . vasopressin (PITRESSIN) 50 Units in sodium chloride 0.9 % 250 mL infusion - *FOR GI BLEED* 0.03 Units/min (11/15/2020 1103)   PRN Meds:.albuterol, bisacodyl, dextrose, fentaNYL, metoCLOPramide **OR** metoCLOPramide (REGLAN) injection, midazolam, midazolam, ondansetron **OR** ondansetron (ZOFRAN) IV, oxyCODONE-acetaminophen, phenol, sodium chloride flush  SUBJECTIVE:  24 hour events:  Status post OR yesterday for repeat incision and drainage of right thigh abscess as well as excisional debridement of left upper extremity tissue. Bleeding duodenal ulcer overnight requiring urgent GI and interventional radiology intervention, intubation, and increasing requirement of vasopressors  Patient currently intubated and sedated.  Minimally responsive but opens eyes to voice.  Review of Systems  Unable to perform ROS:  Intubated      OBJECTIVE:   Blood pressure 121/75, pulse 87, temperature (!) 92.48 F (33.6 C), resp. rate (!) 24, height 5' 5"  (1.651 m), weight 75.7 kg, SpO2 100 %. Body mass index is 27.77 kg/m.  Physical Exam Constitutional:      Comments: Intubated, sedated, opens eyes to voice, minimally responsive  HENT:     Head: Normocephalic and atraumatic.     Mouth/Throat:     Comments: Endotracheal tube in place Eyes:     General: No scleral icterus. Cardiovascular:     Rate and Rhythm: Normal rate and regular rhythm.     Heart sounds: No murmur heard.   Pulmonary:     Comments: Ventilated, coarse breath sounds Abdominal:     General: There is no distension.     Palpations: Abdomen is soft.     Tenderness: There is no abdominal tenderness.  Musculoskeletal:     Cervical back: Normal range of motion and neck supple.     Comments: Right upper extremity PICC line Femoral line  Skin:    Coloration: Skin is pale.  Neurological:     Comments: Minimally responsive, opens eyes to voice  Psychiatric:     Comments: Unable to assess      Lab Results: Lab Results  Component Value Date   WBC 11.4 (H) 11/17/2020   HGB 13.5 11/10/2020   HCT 39.0 11/09/2020   MCV 86.7 10/30/2020   PLT 61 (L) 11/18/2020    Lab Results  Component Value Date   NA 138 10/31/2020   K 4.5 10/31/2020   CO2 18 (L) 10/30/2020   GLUCOSE 312 (H) 10/21/2020   BUN 23 (H) 11/19/2020   CREATININE 0.82 11/17/2020   CALCIUM 6.5 (L) 11/06/2020   GFRNONAA >60 10/30/2020   GFRAA >60 08/21/2019    Lab Results  Component Value Date   ALT 37 11/09/2020   AST 140 (H) 10/21/2020   ALKPHOS 75 11/06/2020   BILITOT 0.9 10/28/2020       Component Value Date/Time   CRP 4.3 (H) 11/12/2020 1053    No results found for: ESRSEDRATE   I have reviewed the micro and lab results in Epic.  Imaging: DG CHEST PORT 1 VIEW  Result Date: 11/11/2020 CLINICAL DATA:  Intubated EXAM: PORTABLE CHEST 1 VIEW  COMPARISON:  10/21/2020, 11/15/2020 FINDINGS: Interval intubation, tip of the endotracheal tube about a cm superior to the carina. Right upper extremity central venous catheter tip over the SVC. Esophageal tube tip below the diaphragm but incompletely visualized. Slightly improved aeration at the bases. Hazy left lung base opacity may reflect pleural fluid, atelectasis or minimal infiltrate. IMPRESSION: Interval intubation with tip of the endotracheal tube about a cm superior to the carina. Slightly improved aeration at the bases. Electronically Signed   By: Donavan Foil M.D.   On: 11/19/2020 00:43     Imaging  independently reviewed in Epic.    [facility] for Infectious Disease [place] [phone] pager 11/13/2020, 11:47 AM  I spent greater than 35 minutes with the patient including  greater than 50% of time in face to face counsel of the patient and in coordination of their care.

## 2020-11-02 NOTE — Progress Notes (Signed)
Elink aware of patient's condition. GI paged and notified as well as orthopedics.

## 2020-11-02 NOTE — Progress Notes (Signed)
Occupational Therapy Discharge Patient Details Name: ODEAN FESTER MRN: [id number] DOB: [birth year] Today's Date: 10/29/2020 Time:  -     Patient discharged from OT services secondary to medical decline - will need to re-order OT to resume therapy services. Patient requiring emergent bedside endoscopy and intubation last night 4/12. Patient still with low hemoglobin 6.9. Per critical care note patient in grave medical condition. OT will sign off at this time. Please re-consult when patient medically stable.   Please see latest therapy progress note for current level of functioning and progress toward goals.    Marlyce Huge OT OT pager: [phone]   GO     Carmelia Roller 11/19/2020, 6:20 AM

## 2020-11-02 NOTE — Procedures (Signed)
Intubation Procedure Note  Ronald Brady  [id number]  09-28-1960  Date:11/04/2020  Time:12:06 AM   Provider Performing:Claudell Rhody [name]    Procedure: Intubation (31500)  Indication(s) Respiratory Failure  Consent Unable to obtain consent due to emergent nature of procedure.   Anesthesia Etomidate, Versed and Rocuronium   Time Out Verified patient identification, verified procedure, site/side was marked, verified correct patient position, special equipment/implants available, medications/allergies/relevant history reviewed, required imaging and test results available.   Sterile Technique Usual hand hygeine, masks, and gloves were used   Procedure Description Patient positioned in bed supine.  Sedation given as noted above.  Patient was intubated with endotracheal tube using Glidescope.  View was Grade 2 only posterior commissure .  Number of attempts was 1.  Colorimetric CO2 detector was consistent with tracheal placement.   Complications/Tolerance None; patient tolerated the procedure well. Chest X-ray is ordered to verify placement.   EBL Minimal   Specimen(s) None

## 2020-11-02 NOTE — Procedures (Signed)
Central Venous Catheter Insertion Procedure Note  ASIA FAVATA  [id number]  05/15/1961  Date:11/20/2020  Time:12:07 AM   Provider Performing:[place] Frieden B Essynce Munsch   Procedure: Insertion of Non-tunneled Central Venous Catheter(36556) with US guidance (79892)   Indication(s) Medication administration and Difficult access  Consent Unable to obtain consent due to emergent nature of procedure.  Anesthesia None  Timeout Verified patient identification, verified procedure, site/side was marked, verified correct patient position, special equipment/implants available, medications/allergies/relevant history reviewed, required imaging and test results available.  Sterile Technique Maximal sterile technique including full sterile barrier drape, hand hygiene, sterile gown, sterile gloves, mask, hair covering, sterile ultrasound probe cover (if used).  Procedure Description Area of catheter insertion was cleaned with chlorhexidine and draped in sterile fashion.  With real-time ultrasound guidance a central venous catheter was placed into the left femoral vein. Nonpulsatile blood flow and easy flushing noted in all ports.  The catheter was sutured in place and sterile dressing applied.  Complications/Tolerance None; patient tolerated the procedure well. Chest X-ray is ordered to verify placement for internal jugular or subclavian cannulation.   Chest x-ray is not ordered for femoral cannulation.  EBL Minimal  Specimen(s) None

## 2020-11-02 NOTE — Progress Notes (Signed)
Pt received a total of 16 blood products last night. Critical Care aware.

## 2020-11-02 NOTE — Procedures (Signed)
Bedside endoscopy was performed emergently. Patient was intubated but not sedated. Despite using 2 IV pressors, receiving 4 units of PRBC, 1 unit of FFP and 1 unit of platelet, patient systolic blood pressure was between 50 to 60 mmHg.  As the scope was advanced into the esophagus, blood was noted in the esophagus. Large amount of clots and fresh blood was noted in the gastric cavity. On retroflexion the cardia and fundus could not be evaluated well. Around 1350 mL of fresh blood was suctioned via gastroscope. As the scope was advanced in the duodenal bulb, a large cratered ulcer was noted in the duodenal sweep, this was treated with 6 cc of epinephrine and hemospray. Another 2 cm cratered ulcer with a visible vessel was noted in first portion of duodenum, this was treated with 9 cc of epinephrine and hemospray.  Recommendations: It is unclear whether the source of bleeding has been identified, but it is likely in the small bowel as patient was noted to have duodenal ulcers. Recommend stat IR consultation for embolization. This was discussed with critical care attending Dr. Melody Comas who was present at bedside.  Patient's prognosis is extremely guarded. He has a PT of 25.4 and INR of 2.3. His hemoglobin post 4 unit PRBC transfusion was 6.9, I anticipate that to be lower after suctioning 1350 cc of fresh blood and continued bleeding noted. Patient will benefit from vitamin K 10 mg IV x1 dose. Continue Protonix 8 mg/h. For now continue octreotide, until patient stabilizes and a repeat endoscopy can be performed.  Kerin Salen, MD

## 2020-11-02 NOTE — Progress Notes (Signed)
Chaplain engaged in an initial visit with Ronald Brady (Ronald Brady's) sister, Ronald Brady.  Ronald Brady drove from [place] to see her brother.  She is the one who initially called EMS when she came to visit her brother.  She noticed that he was not well.  Ronald Brady is thankful that she listened to her instincts to visit her brother.  Ronald Brady noted that she and Ronald Brady are very close.  They are about two years apart.  They also raised their children to to be very close.  She noted that Ronald Brady is now estranged from his daughter.    Chaplain offered listening and support.    2020-11-28 1300  Clinical Encounter Type  Visited With Patient and family together  Visit Type Follow-up

## 2020-11-02 NOTE — Progress Notes (Addendum)
PCCM Night Update:  Called to the bedside by eLink provider for unstable GI bleeding. Patient was starting to receive his first unit of blood and a bolus of normal saline. Hemoglobin was 3.8 after large melanotic stool. He was on levophed when I arrived and I added vasopressin. He was then intubated due to shock. A central line and arterial line were placed.   Patient will have received a total of 4 units PRBCs, 1 unit platelets and 1 unit FFP. Nursing has been instructed to check a cbc, cmp and pt/inr after all products have been transfused.   An OG was placed with bright red blood noted on suction. OG tube was left to gravity.  I spoke with Dr. Marca Ancona of GI and informed her of concern for upper GI bleed given melena and bright red blood per OG tube. She will be working with the endoscopy team to perform EGD as soon as possible.   I attempted to call patient's sister but she did not answer.   Total critical care time excluding procedures was 80 minutes  Melody Comas, MD [facility] Office: [phone]   See Amion for personal pager PCCM on call pager [phone] until 7pm. Please call Elink 7p-7a. [phone]

## 2020-11-02 NOTE — Progress Notes (Signed)
ETT pulled back 1cm per MD

## 2020-11-02 NOTE — Progress Notes (Signed)
[facility] Gastroenterology Progress Note  Ronald Brady 60 y.o. 17-Mar-1961  CC:  Upper GI bleeding  Subjective: Patient intubated and thus unable to provide subjective data.  ROS : Review of Systems  Unable to perform ROS: Intubated   Objective: Vital signs in last 24 hours: Vitals:   11/19/2020 0745 11/06/2020 0800  BP: 121/75   Pulse: 87   Resp: (!) 26 (!) 24  Temp:  (!) 92.48 F (33.6 C)  SpO2: 100%     Physical Exam:  General:  Ill-appearing, intubated  Head:  Normocephalic, without obvious abnormality, atraumatic  Eyes:  Conjunctival pallor, EOMs intact  Lungs:   Clear to auscultation bilaterally, respirations unlabored  Heart:  Regular rate and rhythm, S1, S2 normal  Abdomen:   Soft, non-distended, sluggish bowel sounds      Lab Results: Recent Labs    10/28/2020 0456  NA 139  K 3.1*  CL 108  CO2 25  GLUCOSE 124*  BUN 25*  CREATININE 0.62  CALCIUM 7.5*   Recent Labs    10/26/2020 0456  AST 11*  ALT <5  ALKPHOS 109  BILITOT <0.1*  PROT 4.4*  ALBUMIN 1.2*   Recent Labs    11/13/2020 0456 11/04/2020 1511 11/06/2020 2205 10/30/2020 0105  WBC 5.1 6.0  --  20.1*  NEUTROABS 3.1  --   --   --   HGB 4.6* 7.2* 3.8* 6.9*  HCT 14.8* 22.2* 12.4* 21.8*  MCV 92.5 88.8  --  97.8  PLT 169 198  --  140*   Recent Labs    11/04/2020 0105  LABPROT 25.4*  INR 2.3*     Assessment: Upper GI bleeding s/p gastroduodenal artery embolization 11/03/19 and EGD 11/03/19: non-bleeding duodenal ulcer with a visible vessel, injected, hemostatic spray applied.  Non-bleeding duodenal ulcer with adherent clot, Injected, hemostatic spray applied. -Hgb 6.9, post-transfusion Hgb pending -INR 2.3, Vitamin K 10 mg given, repeat pending  Plan: Continue IV Protonix drip.  Continue to monitor H&H with transfusion as needed to maintain Hgb >7.  Continue supportive care.  [facility] will follow.  Edrick Kins PA-C 11/14/2020, 9:12 AM  Contact #  [phone]

## 2020-11-02 NOTE — TOC Progression Note (Signed)
Transition of Care [facility]) - Progression Note    Patient Details  Name: Ronald Brady MRN: [id number] Date of Birth: 1960/12/19  Transition of Care [facility]) CM/SW Contact  Golda Acre, RN Phone Number: 10/31/2020, 7:45 AM  Clinical Narrative:    unstable GI bleeding. Patient was starting to receive his first unit of blood and a bolus of normal saline. Hemoglobin was 3.8 after large melanotic stool. He was on levophed when I arrived and I added vasopressin. He was then intubated due to shock. A central line and arterial line were placed.   Patient will have received a total of 4 units PRBCs, 1 unit platelets and 1 unit FFP. Nursing has been instructed to check a cbc, cmp and pt/inr after all products have been transfused.   An OG was placed with bright red blood noted on suction. OG tube was left to gravity.  I spoke with Dr. Marca Ancona of GI and informed her of concern for upper GI bleed given melena and bright red blood per OG tube. She will be working with the endoscopy team to perform EGD as soon as possible.  Sister has been contacted and made aware that patient may not survive. Still wants aggressive measures. PLAN: following for progression.  Expected Discharge Plan: Home w Home Health Services Barriers to Discharge: Continued Medical Work up  Expected Discharge Plan and Services Expected Discharge Plan: Home w Home Health Services   Discharge Planning Services: CM Consult   Living arrangements for the past 2 months: Single Family Home                                       Social Determinants of Health (SDOH) Interventions    Readmission Risk Interventions No flowsheet data found.

## 2020-11-02 NOTE — Progress Notes (Signed)
PT Cancellation Note  Patient Details Name: Ronald Brady MRN: [id number] DOB: 1961-05-19   Cancelled Treatment:    Reason Eval/Treat Not Completed: Medical issues which prohibited therapy;Patient not medically ready. Patient with new bed rest orders. Patient requiring emergent bedside endoscopy and intubation last night 4/12. Patient received total of 4 Units PRBC, 1 unit of platelets, and 1 unit of plasma all emergently and still with low hemoglobin 6.9. Per critical care note patient in grave medical condition. PT will sign off at this time. Please re-consult when patient medically stable.   Please see latest therapy progress note for current level of functioning and progress toward goals.     Wynn Maudlin, DPT Acute Rehabilitation Services Office [phone] Pager [phone]

## 2020-11-02 NOTE — Progress Notes (Signed)
SLP Cancellation Note  Patient Details Name: Ronald Brady MRN: [id number] DOB: 1961/05/30   Cancelled treatment:       Reason Eval/Treat Not Completed: Other (comment);Medical issues which prohibited therapy (per PT note "Medical issues which prohibited therapy;Patient not medically ready. Patient with new bed rest orders. Patient requiring emergent bedside endoscopy and intubation last night 4/12. Please reorder if pt becomes appropriate.)  Rolena Infante, MS [facility] SLP Acute Rehab Services Office [phone] Pager [phone]   Chales Abrahams 11/16/2020, 3:07 PM

## 2020-11-02 NOTE — Progress Notes (Signed)
Peripherally Inserted Central Catheter Placement  The IV Nurse has discussed with the patient and/or persons authorized to consent for the patient, the purpose of this procedure and the potential benefits and risks involved with this procedure.  The benefits include less needle sticks, lab draws from the catheter, and the patient may be discharged home with the catheter. Risks include, but not limited to, infection, bleeding, blood clot (thrombus formation), and puncture of an artery; nerve damage and irregular heartbeat and possibility to perform a PICC exchange if needed/ordered by physician.  Alternatives to this procedure were also discussed.  Bard Power PICC patient education guide, fact sheet on infection prevention and patient information card has been provided to patient /or left at bedside. PICC exchanged without difficulty.     PICC Placement Documentation  PICC Triple Lumen 11/09/2020 PICC Right Basilic 41 cm 0 cm (Active)  Indication for Insertion or Continuance of Line Vasoactive infusions 11/04/2020 2110  Exposed Catheter (cm) 0 cm 11/03/2020 2110  Site Assessment Clean;Dry;Intact 10/28/2020 2110  Lumen #1 Status Blood return noted;Flushed;Saline locked 11/06/2020 2110  Lumen #2 Status Blood return noted;Flushed;Saline locked 11/15/2020 2110  Lumen #3 Status Blood return noted;Saline locked 10/21/2020 2110  Dressing Type Transparent;Occlusive;Securing device 11/09/2020 2110  Dressing Status Clean;Dry;Intact 10/31/2020 2110  Antimicrobial disc in place? Yes 11/08/2020 2110  Safety Lock Not Applicable 11/13/2020 2110  Line Adjustment (NICU/IV Team Only) No 10/31/2020 2110  Dressing Intervention New dressing 11/11/2020 2110  Dressing Change Due 11/09/20 10/26/2020 2110       Christeen Douglas 10/31/2020, 9:21 PM

## 2020-11-02 NOTE — Progress Notes (Signed)
PCCM Update:  I spoke with patient's sister, Zella Ball, and provided an update on the night's events. She wished to continue aggressive measures. She was informed that her brother may not survive the night and she understands the gravity of the situation he is in.   Melody Comas, MD [facility] Office: [phone]   See Amion for personal pager PCCM on call pager [phone] until 7pm. Please call Elink 7p-7a. [phone]

## 2020-11-02 NOTE — Progress Notes (Signed)
Nutrition Follow-up  DOCUMENTATION CODES:   Non-severe (moderate) malnutrition in context of chronic illness  INTERVENTION:  - weigh patient today.  - monitor for ability to start TF.    NUTRITION DIAGNOSIS:   Moderate Malnutrition related to chronic illness (uncontrolled DM) as evidenced by mild fat depletion,mild muscle depletion. -ongoing  GOAL:   Patient will meet greater than or equal to 90% of their needs -unmet/unable to meet at this time  MONITOR:   Vent status,Labs,Weight trends,I & O's,Other (Comment) (TF initiation)  REASON FOR ASSESSMENT:   Ventilator  ASSESSMENT:   60 y.o. male with medical history of DM, HTN, and emphysema. He presented to the ED via EMS after being found by sister and was noted to have been in the same chair x4 days with inability to get up. He reported to ED staff that he had torn his hamstring which prevented him from getting up and that injury occurred 3 weeks PTA. He did not eat or take his insulin in the 4 days PTA.  Patient discussed in rounds this AM. Plan to hold on TF today and re-assess for TF to start 4/14. OGT in place and to LIS with scant output.   Patient was intubated shortly after midnight, had upper endoscopy earlier this AM, and is POD #0 celiac and superior mesenteric angiogram and embolization of active extravasation from GDA branch d/t UGIB.   He has not been weighed since 4/7. Mild pitting edema to BUE and BLE and moderate pitting edema to perineal area documented in the edema section of flow sheet.      Patient is currently intubated on ventilator support MV: 13.3 L/min Temp (24hrs), Avg:94.9 F (34.9 C), Min:91.58 F (33.1 C), Max:98.1 F (36.7 C) Propofol: none BP: 101/59 and MAP: 73  Labs reviewed; CBGs: 287 mg/dl, BUN: 23 mg/dl, Ca: 6.5 mg/dl, Phos: 6.9 mg/dl, Mg: 1.5 mg/dl, AST elevated. Medications reviewed; 100 mg colace BID, 100 mg sol-cortef TID, sliding scale novolog, 6 units novolog TID, 20 units  lantus/day, 1 tablet multivitamin with minerals/day, 8 mg/hr protonix, 10 mg IV vitamin K x1 run 4/13, 10 mEq IV KCl x6 runs 4/12. Drips; fentanyl @ 100 mcg/hr, levo @ 16 mcg/min, vase @ 0.03 units/min.    Diet Order:   Diet Order            Diet NPO time specified  Diet effective now                 EDUCATION NEEDS:   Not appropriate for education at this time  Skin:  Skin Assessment: Skin Integrity Issues: Skin Integrity Issues:: Stage II,Incisions,Wound VAC Stage II: L buttocks Wound Vac: R leg x3 Incisions: R thigh (4/7); L wrist (4/7); bilateral legs (4/12); sacrum (4/12); L hand (4/12); R groin (4/13)  Last BM:  4/13 (type 6 x1)  Height:   Ht Readings from Last 1 Encounters:  10/26/2020 5\' 5"  (1.651 m)    Weight:   Wt Readings from Last 1 Encounters:  10/21/2020 75.7 kg    Estimated Nutritional Needs:  Kcal:  1722 kcal Protein:  105-125 grams Fluid:  >/= 2 L/day     12/27/20, MS, RD, LDN, CNSC Inpatient Clinical Dietitian RD pager # available in AMION  After hours/weekend pager # available in [facility]

## 2020-11-02 NOTE — Op Note (Signed)
[facility] Patient Name: Ronald Brady Procedure Date: 02-Dec-2020 MRN: [id number] Attending MD: Kerin Salen , MD Date of Birth: Jun 18, 1961 CSN: [id number] Age: 60 Admit Type: Inpatient Procedure:                Upper GI endoscopy Indications:              Fresh blood return on NG tube, Melena,                            hemodynamically unstable despite IV pressors and 4                            units PRBC tranfusion Providers:                Kerin Salen, MD, Clearnce Sorrel, RN, Rosilyn Mings,                            Technician Referring MD:             Denver Faster, Critical Care Medicines:                None Complications:            No immediate complications. Estimated Blood Loss:     1350 ml of fresh blood was suctioned. It is unclear                            if the bleeding is ongoing.Due to presence of large                            amount of clot in the stomach(fundus and body) and                            duodenum, it is unclear if there is another source                            of bleeding. Procedure:                Pre-Anesthesia Assessment:                           - Prior to the procedure, a History and Physical                            was performed, and patient medications and                            allergies were reviewed. The patient's tolerance of                            previous anesthesia was also reviewed. The risks                            and benefits of the procedure and the sedation  options and risks were discussed with the patient.                            All questions were answered, and informed consent                            was obtained. Prior Anticoagulants: The patient has                            taken no previous anticoagulant or antiplatelet                            agents. ASA Grade Assessment: IV to V - A moribund                            patient who is not  expected to survive without the                            operation. After reviewing the risks and benefits,                            the patient was deemed in satisfactory condition to                            undergo the procedure.                           After obtaining informed consent, the endoscope was                            passed under direct vision. Throughout the                            procedure, the patient's blood pressure, pulse, and                            oxygen saturations were monitored continuously. The                            GIF-H190 ([id number] Olympus gastroscope was                            introduced through the mouth, and advanced to the                            second part of duodenum. The upper GI endoscopy was                            accomplished without difficulty. The patient                            tolerated the procedure well. Scope In: Scope Out: Findings:      Red blood and clot was found in the entire  esophagus.      Red blood and clot was found in the cardia, in the gastric fundus, in       the gastric body, at the incisura, in the gastric antrum and at the       pylorus.      Red blood and clot was found in the duodenal bulb, in the first portion       of the duodenum and in the second portion of the duodenum.      One non-bleeding cratered duodenal ulcer with a visible vessel was found       in the first portion of the duodenum. The lesion was 20 mm in largest       dimension. Area was successfully injected with 9 mL of a 1:10,000       solution of epinephrine for drug delivery. To stop active bleeding,       hemostatic spray was deployed. A single spray was applied.      One non-bleeding cratered duodenal ulcer with adherent clot/pigmentation       was found in the duodenal bulb. The lesion was thirty mm by thirty mm in       largest dimension. Area was successfully injected with 6 mL of a       1:10,000 solution of  epinephrine for drug delivery. For hemostasis,       hemostatic spray was deployed. A single spray was applied. Impression:               - Red blood in the esophagus.                           - Red blood in the incisura, in the gastric antrum,                            in the pylorus, in the cardia, in the gastric                            fundus and in the gastric body.                           - Blood in the duodenal bulb and in the second                            portion of the duodenum.                           - Non-bleeding duodenal ulcer with a visible                            vessel. Injected. hemostatic spray applied.                           - Non-bleeding duodenal ulcer with adherent clot.                            Injected. hemostatic spray applied.                           - No specimens collected.  Moderate Sedation:      None. Recommendation:           - IR consult stat for embolization.                           - Continue resuscitative efforts with PRBC                            transfusion, Protonix infusion, vitamin K 10 mg x 1                            dose.                           - Very poor prognosis. Procedure Code(s):        --- Professional ---                           [phone], Esophagogastroduodenoscopy, flexible,                            transoral; with control of bleeding, any method                           43236, 59, Esophagogastroduodenoscopy, flexible,                            transoral; with directed submucosal injection(s),                            any substance Diagnosis Code(s):        --- Professional ---                           K22.8, Other specified diseases of esophagus                           K92.2, Gastrointestinal hemorrhage, unspecified                           K26.4, Chronic or unspecified duodenal ulcer with                            hemorrhage                           K92.0, Hematemesis                            K92.1, Melena (includes Hematochezia) CPT copyright 2019 American Medical Association. All rights reserved. The codes documented in this report are preliminary and upon coder review may  be revised to meet current compliance requirements. Kerin Salen, MD 11/13/2020 2:34:50 AM This report has been signed electronically. Number of Addenda: 0

## 2020-11-03 ENCOUNTER — Encounter (HOSPITAL_COMMUNITY): Payer: Self-pay | Admitting: Gastroenterology

## 2020-11-03 DIAGNOSIS — A419 Sepsis, unspecified organism: Secondary | ICD-10-CM | POA: Diagnosis not present

## 2020-11-03 DIAGNOSIS — L02415 Cutaneous abscess of right lower limb: Secondary | ICD-10-CM | POA: Diagnosis not present

## 2020-11-03 DIAGNOSIS — L02414 Cutaneous abscess of left upper limb: Secondary | ICD-10-CM | POA: Diagnosis not present

## 2020-11-03 DIAGNOSIS — R7881 Bacteremia: Secondary | ICD-10-CM | POA: Diagnosis not present

## 2020-11-03 DIAGNOSIS — R6521 Severe sepsis with septic shock: Secondary | ICD-10-CM | POA: Diagnosis not present

## 2020-11-03 LAB — PREPARE FRESH FROZEN PLASMA
Unit division: 0
Unit division: 0
Unit division: 0
Unit division: 0
Unit division: 0
Unit division: 0

## 2020-11-03 LAB — BPAM FFP
Blood Product Expiration Date: 202204172359
Blood Product Expiration Date: 202204172359
Blood Product Expiration Date: 202204140321
Blood Product Expiration Date: 202204140423
Blood Product Expiration Date: 202204182359
Blood Product Expiration Date: 202204182359
Blood Product Expiration Date: 202204182359
Blood Product Expiration Date: 202204182359
ISSUE DATE / TIME: 202204122340
ISSUE DATE / TIME: 202204130222
ISSUE DATE / TIME: 202204130442
ISSUE DATE / TIME: 202204130620
ISSUE DATE / TIME: 202204130645
ISSUE DATE / TIME: 202204130645
Unit Type and Rh: 5100
Unit Type and Rh: 5100
Unit Type and Rh: 5100
Unit Type and Rh: 5100
Unit Type and Rh: 5100
Unit Type and Rh: 5100
Unit Type and Rh: 5100
Unit Type and Rh: 5100

## 2020-11-03 LAB — BASIC METABOLIC PANEL
Anion gap: 7 (ref 5–15)
BUN: 34 mg/dL — ABNORMAL HIGH (ref 6–20)
CO2: 23 mmol/L (ref 22–32)
Calcium: 7 mg/dL — ABNORMAL LOW (ref 8.9–10.3)
Chloride: 110 mmol/L (ref 98–111)
Creatinine, Ser: 1.04 mg/dL (ref 0.61–1.24)
GFR, Estimated: >60 mL/min (ref >60)
Glucose, Bld: 177 mg/dL — ABNORMAL HIGH (ref 70–99)
Potassium: 3.7 mmol/L (ref 3.5–5.1)
Sodium: 140 mmol/L (ref 135–145)

## 2020-11-03 LAB — PHOSPHORUS: Phosphorus: 4.8 mg/dL — ABNORMAL HIGH (ref 2.5–4.6)

## 2020-11-03 LAB — GLUCOSE, CAPILLARY
Glucose-Capillary: 142 mg/dL — ABNORMAL HIGH (ref 70–99)
Glucose-Capillary: 59 mg/dL — ABNORMAL LOW (ref 70–99)
Glucose-Capillary: 148 mg/dL — ABNORMAL HIGH (ref 70–99)
Glucose-Capillary: 175 mg/dL — ABNORMAL HIGH (ref 70–99)
Glucose-Capillary: 165 mg/dL — ABNORMAL HIGH (ref 70–99)
Glucose-Capillary: 157 mg/dL — ABNORMAL HIGH (ref 70–99)

## 2020-11-03 LAB — BPAM PLATELET PHERESIS
Blood Product Expiration Date: 202204142359
Blood Product Expiration Date: 202204152359
ISSUE DATE / TIME: 202204130621
Unit Type and Rh: 5100
Unit Type and Rh: 6200

## 2020-11-03 LAB — PREPARE PLATELET PHERESIS
Unit division: 0
Unit division: 0

## 2020-11-03 LAB — CBC
HCT: 29 % — ABNORMAL LOW (ref 39.0–52.0)
Hemoglobin: 10.1 g/dL — ABNORMAL LOW (ref 13.0–17.0)
MCH: 30.1 pg (ref 26.0–34.0)
MCHC: 34.8 g/dL (ref 30.0–36.0)
MCV: 86.3 fL (ref 80.0–100.0)
Platelets: 50 10*3/uL — ABNORMAL LOW (ref 150–400)
RBC: 3.36 MIL/uL — ABNORMAL LOW (ref 4.22–5.81)
RDW: 14.9 % (ref 11.5–15.5)
WBC: 7.3 10*3/uL (ref 4.0–10.5)
nRBC: 0.4 % — ABNORMAL HIGH (ref 0.0–0.2)

## 2020-11-03 LAB — COMPREHENSIVE METABOLIC PANEL
ALT: 19 U/L (ref 0–44)
AST: 62 U/L — ABNORMAL HIGH (ref 15–41)
Albumin: 2.3 g/dL — ABNORMAL LOW (ref 3.5–5.0)
Alkaline Phosphatase: 48 U/L (ref 38–126)
Anion gap: 6 (ref 5–15)
BUN: 31 mg/dL — ABNORMAL HIGH (ref 6–20)
CO2: 22 mmol/L (ref 22–32)
Calcium: 6.7 mg/dL — ABNORMAL LOW (ref 8.9–10.3)
Chloride: 106 mmol/L (ref 98–111)
Creatinine, Ser: 1.03 mg/dL (ref 0.61–1.24)
GFR, Estimated: >60 mL/min (ref >60)
Glucose, Bld: 122 mg/dL — ABNORMAL HIGH (ref 70–99)
Potassium: 4.1 mmol/L (ref 3.5–5.1)
Sodium: 134 mmol/L — ABNORMAL LOW (ref 135–145)
Total Bilirubin: 0.9 mg/dL (ref 0.3–1.2)
Total Protein: 3.9 g/dL — ABNORMAL LOW (ref 6.5–8.1)

## 2020-11-03 LAB — MAGNESIUM: Magnesium: 1.4 mg/dL — ABNORMAL LOW (ref 1.7–2.4)

## 2020-11-03 MED ORDER — POTASSIUM CHLORIDE 20 MEQ PO PACK
20.0000 meq | PACK | Freq: Once | ORAL | Status: AC
  Administered 2020-11-03: 20 meq
  Filled 2020-11-03: qty 1

## 2020-11-03 MED ORDER — PANTOPRAZOLE SODIUM 40 MG IV SOLR
40.0000 mg | Freq: Two times a day (BID) | INTRAVENOUS | Status: DC
  Administered 2020-11-03 – 2020-11-04 (×3): 40 mg via INTRAVENOUS
  Filled 2020-11-03 (×4): qty 40

## 2020-11-03 MED ORDER — POTASSIUM CHLORIDE CRYS ER 20 MEQ PO TBCR: 20.0000 meq | EXTENDED_RELEASE_TABLET | Freq: Once | ORAL | Status: DC

## 2020-11-03 MED ORDER — FUROSEMIDE 10 MG/ML IJ SOLN
40.0000 mg | Freq: Once | INTRAMUSCULAR | Status: AC
  Administered 2020-11-03: 40 mg via INTRAVENOUS
  Filled 2020-11-03: qty 4

## 2020-11-03 MED ORDER — VITAL AF 1.2 CAL PO LIQD
1000.0000 mL | ORAL | Status: DC
  Administered 2020-11-03: 1000 mL

## 2020-11-03 MED ORDER — DEXTROSE 10 % IV SOLN: INTRAVENOUS | Status: DC

## 2020-11-03 MED ORDER — DEXTROSE 10 % IV SOLN
INTRAVENOUS | Status: DC
  Filled 2020-11-03: qty 1000

## 2020-11-03 MED ORDER — MAGNESIUM SULFATE 2 GM/50ML IV SOLN
2.0000 g | Freq: Once | INTRAVENOUS | Status: AC
  Administered 2020-11-03: 2 g via INTRAVENOUS
  Filled 2020-11-03: qty 50

## 2020-11-03 MED ORDER — VITAL HIGH PROTEIN PO LIQD: 1000.0000 mL | ORAL | Status: DC

## 2020-11-03 NOTE — Progress Notes (Signed)
Inpatient Diabetes Program Recommendations  AACE/ADA: New Consensus Statement on Inpatient Glycemic Control (2015)  Target Ranges:  Prepandial:   less than 140 mg/dL      Peak postprandial:   less than 180 mg/dL (1-2 hours)      Critically ill patients:  140 - 180 mg/dL   Lab Results  Component Value Date   GLUCAP 148 (H) 11/03/2020   HGBA1C 12.9 (H) 10/24/2020    Review of Glycemic Control Results for KEEVON, HENNEY (MRN [id number]) as of 11/03/2020 09:35  Ref. Range 10/24/2020 07:56 10/31/2020 11:57 11/15/2020 15:39 11/07/2020 19:50 11/18/2020 23:00 11/03/2020 03:40 11/03/2020 04:23 11/03/2020 07:37  Glucose-Capillary Latest Ref Range: 70 - 99 mg/dL 758 (H) 832 (H) 549 (H) 103 (H) 88 59 (L) 142 (H) 148 (H)    Current orders for Inpatient glycemic control:  Lantus 10 units qpm Novolog 0-15 units Q4  Inpatient Diabetes Program Recommendations:     Hydrocortisone Sod Succinate 100 mg given 0625 on 4/13 Note Lantus not given last night, pt had hypoglycemia 59 this am. D 10 infusion started.  -  D/c Lantus for now.  Pt still ordered Novolog 0-15 Q4 hours will watch for now on correction.  Thanks,  Christena Deem RN, MSN, BC-ADM Inpatient Diabetes Coordinator Team Pager [phone] (8a-5p)

## 2020-11-03 NOTE — Progress Notes (Addendum)
Nutrition Follow-up  DOCUMENTATION CODES:   Non-severe (moderate) malnutrition in context of chronic illness  INTERVENTION:  - will order TF regimen: Vital AF 1.2 @ 20 ml/hr. - once able to advanced, recommend advancing by 10 ml every 8 hours to reach goal rate of 60 ml/hr. - at goal rate, this regimen will provide 1728 kcal (97% kcal need), 108 grams protein, and 1168 ml free water. - free water flush, if desired, to be per CCM given degree of edema.   NUTRITION DIAGNOSIS:   Moderate Malnutrition related to chronic illness (uncontrolled DM) as evidenced by mild fat depletion,mild muscle depletion. -ongoing  GOAL:   Patient will meet greater than or equal to 90% of their needs -unmet/unable to meet at this time  MONITOR:   Vent status,TF tolerance,Labs,Weight trends  REASON FOR ASSESSMENT:   Consult Enteral/tube feeding initiation and management  ASSESSMENT:   60 y.o. male with medical history of DM, HTN, and emphysema. He presented to the ED via EMS after being found by sister and was noted to have been in the same chair x4 days with inability to get up. He reported to ED staff that he had torn his hamstring which prevented him from getting up and that injury occurred 3 weeks PTA. He did not eat or take his insulin in the 4 days PTA.  Patient remains intubated with OGT in place. Patient discussed in rounds this AM and plan for TF initiation today.   No family or visitors present at this time.  Weight significantly up (used weight from 4/3 to re-estimate kcal need) and noted mild pitting edema to RUE, moderate pitting edeam to LUE, and deep pitting edema to BLE and perineal area documented in the edema section of flow sheet.      Patient is currently intubated on ventilator support MV: 9.1 L/min Temp (24hrs), Avg:99.6 F (37.6 C), Min:99.14 F (37.3 C), Max:100.04 F (37.8 C) Propofol: none BP: 118/70 and MAP: 86  Labs reviewed; CBGs: 59, 142, 148, 175 mg/dl, Na:  062 mmol/l, BUN: 31 mg/dl, Ca: 6.7 mg/dl, AST elevated but trending down.  Medications reviewed; 40 mg IV lasix x1 dose 4/14, sliding scale novolog, 15 ml multivitamin/day, 40 mg IV protonix BID, 17 g miralax/day.  IVF; D10 @ 50 ml/hr (408 kcal/24 hours). Drip; fentanyl @ 150 mcg/hr.    Diet Order:   Diet Order            Diet NPO time specified  Diet effective midnight           Diet NPO time specified  Diet effective now                 EDUCATION NEEDS:   Not appropriate for education at this time  Skin:  Skin Assessment: Skin Integrity Issues: Skin Integrity Issues:: Stage II,Incisions,Wound VAC Stage II: L buttocks Wound Vac: R leg x3 Incisions: R thigh (4/7); L wrist (4/7); bilateral legs (4/12); sacrum (4/12); L hand (4/12); R groin (4/13)  Last BM:  4/13 (type 6 x1)  Height:   Ht Readings from Last 1 Encounters:  11/04/2020 5\' 5"  (1.651 m)    Weight:   Wt Readings from Last 1 Encounters:  11/02/20 89.5 kg     Estimated Nutritional Needs:  Kcal:  1775 kcal Protein:  105-125 grams Fluid:  >/= 2 L/day     11/04/20, MS, RD, LDN, CNSC Inpatient Clinical Dietitian RD pager # available in AMION  After hours/weekend pager # available in  AMION

## 2020-11-03 NOTE — Progress Notes (Signed)
eLink Physician-Brief Progress Note Patient Name: Ronald Brady DOB: Oct 23, 1960 MRN: [id number]   Date of Service  11/03/2020  HPI/Events of Note  Hypoglycemia - Blood glucose = 59. Patient is NPO. Currently on Lantus 10 units [place] Q day at 10 PM.   eICU Interventions  Plan: 1. D/C Q 4 hour Novolog [place] tube feeding insulin coverage. 2. D10W IV infusion at 50 mL/hour.  3. Rounding team will need to follow Q 4 hour blood glucose throughout the day and decide on the appropriate 10 PM Lantus dose tomorrow.      Intervention Category Major Interventions: Other:  Lenell Antu 11/03/2020, 3:57 AM

## 2020-11-03 NOTE — Progress Notes (Signed)
[facility]  Date of Admission:  11/09/2020           Reason for visit: Follow up on MSSA bacteremia  Current antibiotics: Day 11 cefazolin (4/3--present)    ASSESSMENT:    MSSA bacteremia: In the setting of left upper extremity abscess and right lower extremity abscess.  Status post incision and drainage 10/21/2020, 11/19/2020, and 10/21/2020.  Most recent operative note reports continued purulent fluid with right lower extremity abscess.  Additionally underwent excisional debridement of left forearm tissue at that time.  His repeat blood cultures are negative from 10/24/2020 and TTE was without evidence of vegetation.  TEE is currently pending Multifactorial shock: Improving related to GI bleed and sepsis GI bleed: Related to bleeding duodenal ulcers.  Status post emergent EGD with epi injection hemospermia.  Status post subsequent embolization to GDA branch by interventional radiology 10/25/2020 Type 2 diabetes: A1c 12.9 Respiratory failure: Remains intubated  PLAN:    Continue cefazolin 2 g every 8 hours for MSSA coverage TEE when able to be performed Vasopressors, blood products, vent management, and GI bleed management per ICU and GI Glycemic control, wound care   Principal Problem:   Severe sepsis with septic shock [facility]) Active Problems:   Abscess of right thigh   Abscess of left forearm   Diabetes mellitus ([place])   Hyperosmolar hyperglycemic state (HHS) ([place])   AKI (acute kidney injury) ([place])   Acute lower UTI   Emphysema lung ([place])   Abscess of right lower extremity   MSSA bacteremia   Pressure injury of skin   Malnutrition of moderate degree    MEDICATIONS:    Scheduled Meds: . chlorhexidine gluconate (MEDLINE KIT)  15 mL Mouth Rinse BID  . Chlorhexidine Gluconate Cloth  6 each Topical Daily  . docusate  100 mg Per Tube BID  . fentaNYL (SUBLIMAZE) injection  50 mcg Intravenous Once  . insulin aspart  0-15 Units Subcutaneous Q4H  .  insulin glargine  10 Units Subcutaneous QHS  . ipratropium-albuterol  3 mL Nebulization BID  . mouth rinse  15 mL Mouth Rinse 10 times per day  . midodrine  10 mg Per Tube TID WC  . mometasone-formoterol  2 puff Inhalation BID  . multivitamin  15 mL Per Tube Daily  . nicotine  21 mg Transdermal Daily  . polyethylene glycol  17 g Per Tube Daily  . sodium chloride flush  10-40 mL Intracatheter Q12H   Continuous Infusions: .  ceFAZolin (ANCEF) IV Stopped (11/03/20 7622)  . dextrose 50 mL/hr at 11/03/20 0642  . fentaNYL infusion INTRAVENOUS 150 mcg/hr (11/03/20 6333)  . norepinephrine (LEVOPHED) Adult infusion 9 mcg/min (11/03/20 5456)  . pantoprozole (PROTONIX) infusion 8 mg/hr (11/03/20 2563)  . phenylephrine (NEO-SYNEPHRINE) Adult infusion 0 mcg/min (10/31/2020 0730)  . vasopressin (PITRESSIN) 50 Units in sodium chloride 0.9 % 250 mL infusion - *FOR GI BLEED* 0.03 Units/min (11/12/2020 2024)   PRN Meds:.oxyCODONE **AND** acetaminophen, albuterol, bisacodyl, dextrose, fentaNYL, metoCLOPramide, [DISCONTINUED] metoCLOPramide **OR** metoCLOPramide (REGLAN) injection, midazolam, midazolam, [DISCONTINUED] ondansetron **OR** ondansetron (ZOFRAN) IV, ondansetron, phenol, sodium chloride flush  SUBJECTIVE:   24 hour events:  Afebrile, T-max 99.6 Creatinine 1.03 WBC 7.3, hemoglobin 10.1 No new cultures No new imaging Improved pressor requirements FiO2 40%, PEEP 5  Awake on vent, denies any complaints or pain.  Nods head yes or no to questions.  Review of Systems  Unable to perform ROS: Intubated      OBJECTIVE:  Blood pressure 140/67, pulse (!) 111, temperature 99.86 F (37.7 C), resp. rate (!) 26, height 5' 5"  (1.651 m), weight 89.5 kg, SpO2 100 %. Body mass index is 32.83 kg/m.  Physical Exam Constitutional:      Comments: Intubated, ill appearing, NAD  HENT:     Head: Normocephalic and atraumatic.     Mouth/Throat:     Comments: ET tube in place Eyes:     Extraocular  Movements: Extraocular movements intact.     Conjunctiva/sclera: Conjunctivae normal.  Cardiovascular:     Rate and Rhythm: Normal rate and regular rhythm.  Pulmonary:     Comments: Ventilated breath sounds, appears comfortable.  Abdominal:     Palpations: Abdomen is soft.     Tenderness: There is no abdominal tenderness.  Skin:    General: Skin is warm and dry.     Comments: UE PICC line in place Femoral line in place  Neurological:     Comments: Awake on vent, following commands.       Lab Results: Lab Results  Component Value Date   WBC 7.3 11/03/2020   HGB 10.1 (L) 11/03/2020   HCT 29.0 (L) 11/03/2020   MCV 86.3 11/03/2020   PLT 50 (L) 11/03/2020    Lab Results  Component Value Date   NA 134 (L) 11/03/2020   K 4.1 11/03/2020   CO2 22 11/03/2020   GLUCOSE 122 (H) 11/03/2020   BUN 31 (H) 11/03/2020   CREATININE 1.03 11/03/2020   CALCIUM 6.7 (L) 11/03/2020   GFRNONAA >60 11/03/2020   GFRAA >60 08/21/2019    Lab Results  Component Value Date   ALT 19 11/03/2020   AST 62 (H) 11/03/2020   ALKPHOS 48 11/03/2020   BILITOT 0.9 11/03/2020       Component Value Date/Time   CRP 4.3 (H) 11/06/2020 1053    No results found for: ESRSEDRATE   I have reviewed the micro and lab results in Epic.  Imaging: IR Angiogram Visceral Selective  Result Date: 10/31/2020 INDICATION: 60 year old gentleman with light threatening acute upper GI bleed presents today measure radiology for angiogram and possible embolization. Upper endoscopy showed multiple duodenal ulcers. EXAM: 1. Ultrasound-guided access of right common femoral artery. 2. Superior mesenteric angiogram 3. Celiac angiogram 4. Gastro duodenal artery angiogram and branch coil embolization MEDICATIONS: None ANESTHESIA/SEDATION: None CONTRAST:  90 mL of Omnipaque 300 intra arterial FLUOROSCOPY TIME:  Fluoroscopy Time: 12 minutes 42 seconds (889 mGy). COMPLICATIONS: None immediate. PROCEDURE: Informed consent was obtained  from the patient's sister following explanation of the procedure, risks, benefits and alternatives. The patient's sister understands, agrees and consents for the procedure. All questions were addressed. The patient was intubated prior to the procedure and could not be consented himself. Given patient's hemorrhage and hypertension, emergent consent was obtained from patient's sister. A time out was performed prior to the initiation of the procedure. Maximal barrier sterile technique utilized including caps, mask, sterile gowns, sterile gloves, large sterile drape, hand hygiene, and chlorhexidine prep. Right groin prepped and draped in usual fashion. Ultrasound image documenting patency of the right common femoral artery was obtained and placed in permanent medical record. Sterile ultrasound gel and probe cover utilized throughout the procedure. Using continuous ultrasound guidance, the right common femoral artery was accessed with a 21 gauge needle. A 0.018 inch guidewire advanced through the 21 gauge needle without resistance. 21 gauge needle exchanged for transitional dilator set over 0.018 inch guidewire. Transitional dilator set exchanged for 5 French sheath over  0.035 inch guidewire. VS 1 catheter utilized to select the superior mesenteric artery. Angiogram demonstrated no active extravasation. VS 1 catheter then utilized to select the celiac artery. Angiogram of the celiac artery demonstrated patent splenic, common hepatic, and left gastric branches. Active extravasation was seen in the region of the gastric duodenal artery. Progreat microcatheter was utilized to access the gastric duodenal artery. Two main branches were identified, 1 of which contributed to the hemorrhage. This branch was selected with the Progreat microcatheter and access was advanced distal to the site of extravasation. Embolization was performed with detachable Ruby coils. One 2 mm, three 3 mm, two 4 mm, and one 5 mm coil was used. Post  embolization angiogram demonstrated no additional extravasation. Additional pancreaticoduodenal branch originating from the GDA was selected and angiograms performed. No extravasation was identified. Progreat microcatheter retracted to the common hepatic artery. Angiogram was performed. No extravasation identified. Progreat and VS 1 catheter were removed and sheath angiogram was performed. Given the diminutive size of the vessels, no closure device was utilized. Right groin sheath was removed and hemostasis achieved with 20 minutes of manual compression. IMPRESSION: Mesenteric angiogram revealed active extravasation from gastric duodenal artery branch, which was treated with coil embolization. Electronically Signed   By: Miachel Roux M.D.   On: 10/25/2020 13:35   IR Angiogram Visceral Selective  Result Date: 11/08/2020 INDICATION: 60 year old gentleman with light threatening acute upper GI bleed presents today measure radiology for angiogram and possible embolization. Upper endoscopy showed multiple duodenal ulcers. EXAM: 1. Ultrasound-guided access of right common femoral artery. 2. Superior mesenteric angiogram 3. Celiac angiogram 4. Gastro duodenal artery angiogram and branch coil embolization MEDICATIONS: None ANESTHESIA/SEDATION: None CONTRAST:  90 mL of Omnipaque 300 intra arterial FLUOROSCOPY TIME:  Fluoroscopy Time: 12 minutes 42 seconds (889 mGy). COMPLICATIONS: None immediate. PROCEDURE: Informed consent was obtained from the patient's sister following explanation of the procedure, risks, benefits and alternatives. The patient's sister understands, agrees and consents for the procedure. All questions were addressed. The patient was intubated prior to the procedure and could not be consented himself. Given patient's hemorrhage and hypertension, emergent consent was obtained from patient's sister. A time out was performed prior to the initiation of the procedure. Maximal barrier sterile technique  utilized including caps, mask, sterile gowns, sterile gloves, large sterile drape, hand hygiene, and chlorhexidine prep. Right groin prepped and draped in usual fashion. Ultrasound image documenting patency of the right common femoral artery was obtained and placed in permanent medical record. Sterile ultrasound gel and probe cover utilized throughout the procedure. Using continuous ultrasound guidance, the right common femoral artery was accessed with a 21 gauge needle. A 0.018 inch guidewire advanced through the 21 gauge needle without resistance. 21 gauge needle exchanged for transitional dilator set over 0.018 inch guidewire. Transitional dilator set exchanged for 5 French sheath over 0.035 inch guidewire. VS 1 catheter utilized to select the superior mesenteric artery. Angiogram demonstrated no active extravasation. VS 1 catheter then utilized to select the celiac artery. Angiogram of the celiac artery demonstrated patent splenic, common hepatic, and left gastric branches. Active extravasation was seen in the region of the gastric duodenal artery. Progreat microcatheter was utilized to access the gastric duodenal artery. Two main branches were identified, 1 of which contributed to the hemorrhage. This branch was selected with the Progreat microcatheter and access was advanced distal to the site of extravasation. Embolization was performed with detachable Ruby coils. One 2 mm, three 3 mm, two 4 mm, and one 5  mm coil was used. Post embolization angiogram demonstrated no additional extravasation. Additional pancreaticoduodenal branch originating from the GDA was selected and angiograms performed. No extravasation was identified. Progreat microcatheter retracted to the common hepatic artery. Angiogram was performed. No extravasation identified. Progreat and VS 1 catheter were removed and sheath angiogram was performed. Given the diminutive size of the vessels, no closure device was utilized. Right groin sheath was  removed and hemostasis achieved with 20 minutes of manual compression. IMPRESSION: Mesenteric angiogram revealed active extravasation from gastric duodenal artery branch, which was treated with coil embolization. Electronically Signed   By: Miachel Roux M.D.   On: 10/31/2020 13:35   IR Angiogram Selective Each Additional Vessel  Result Date: 10/31/2020 INDICATION: 61 year old gentleman with light threatening acute upper GI bleed presents today measure radiology for angiogram and possible embolization. Upper endoscopy showed multiple duodenal ulcers. EXAM: 1. Ultrasound-guided access of right common femoral artery. 2. Superior mesenteric angiogram 3. Celiac angiogram 4. Gastro duodenal artery angiogram and branch coil embolization MEDICATIONS: None ANESTHESIA/SEDATION: None CONTRAST:  90 mL of Omnipaque 300 intra arterial FLUOROSCOPY TIME:  Fluoroscopy Time: 12 minutes 42 seconds (889 mGy). COMPLICATIONS: None immediate. PROCEDURE: Informed consent was obtained from the patient's sister following explanation of the procedure, risks, benefits and alternatives. The patient's sister understands, agrees and consents for the procedure. All questions were addressed. The patient was intubated prior to the procedure and could not be consented himself. Given patient's hemorrhage and hypertension, emergent consent was obtained from patient's sister. A time out was performed prior to the initiation of the procedure. Maximal barrier sterile technique utilized including caps, mask, sterile gowns, sterile gloves, large sterile drape, hand hygiene, and chlorhexidine prep. Right groin prepped and draped in usual fashion. Ultrasound image documenting patency of the right common femoral artery was obtained and placed in permanent medical record. Sterile ultrasound gel and probe cover utilized throughout the procedure. Using continuous ultrasound guidance, the right common femoral artery was accessed with a 21 gauge needle. A 0.018  inch guidewire advanced through the 21 gauge needle without resistance. 21 gauge needle exchanged for transitional dilator set over 0.018 inch guidewire. Transitional dilator set exchanged for 5 French sheath over 0.035 inch guidewire. VS 1 catheter utilized to select the superior mesenteric artery. Angiogram demonstrated no active extravasation. VS 1 catheter then utilized to select the celiac artery. Angiogram of the celiac artery demonstrated patent splenic, common hepatic, and left gastric branches. Active extravasation was seen in the region of the gastric duodenal artery. Progreat microcatheter was utilized to access the gastric duodenal artery. Two main branches were identified, 1 of which contributed to the hemorrhage. This branch was selected with the Progreat microcatheter and access was advanced distal to the site of extravasation. Embolization was performed with detachable Ruby coils. One 2 mm, three 3 mm, two 4 mm, and one 5 mm coil was used. Post embolization angiogram demonstrated no additional extravasation. Additional pancreaticoduodenal branch originating from the GDA was selected and angiograms performed. No extravasation was identified. Progreat microcatheter retracted to the common hepatic artery. Angiogram was performed. No extravasation identified. Progreat and VS 1 catheter were removed and sheath angiogram was performed. Given the diminutive size of the vessels, no closure device was utilized. Right groin sheath was removed and hemostasis achieved with 20 minutes of manual compression. IMPRESSION: Mesenteric angiogram revealed active extravasation from gastric duodenal artery branch, which was treated with coil embolization. Electronically Signed   By: Miachel Roux M.D.   On: 11/12/2020 13:35  IR US Guide Vasc Access Right  Result Date: 10/21/2020 INDICATION: 60 year old gentleman with light threatening acute upper GI bleed presents today measure radiology for angiogram and possible  embolization. Upper endoscopy showed multiple duodenal ulcers. EXAM: 1. Ultrasound-guided access of right common femoral artery. 2. Superior mesenteric angiogram 3. Celiac angiogram 4. Gastro duodenal artery angiogram and branch coil embolization MEDICATIONS: None ANESTHESIA/SEDATION: None CONTRAST:  90 mL of Omnipaque 300 intra arterial FLUOROSCOPY TIME:  Fluoroscopy Time: 12 minutes 42 seconds (889 mGy). COMPLICATIONS: None immediate. PROCEDURE: Informed consent was obtained from the patient's sister following explanation of the procedure, risks, benefits and alternatives. The patient's sister understands, agrees and consents for the procedure. All questions were addressed. The patient was intubated prior to the procedure and could not be consented himself. Given patient's hemorrhage and hypertension, emergent consent was obtained from patient's sister. A time out was performed prior to the initiation of the procedure. Maximal barrier sterile technique utilized including caps, mask, sterile gowns, sterile gloves, large sterile drape, hand hygiene, and chlorhexidine prep. Right groin prepped and draped in usual fashion. Ultrasound image documenting patency of the right common femoral artery was obtained and placed in permanent medical record. Sterile ultrasound gel and probe cover utilized throughout the procedure. Using continuous ultrasound guidance, the right common femoral artery was accessed with a 21 gauge needle. A 0.018 inch guidewire advanced through the 21 gauge needle without resistance. 21 gauge needle exchanged for transitional dilator set over 0.018 inch guidewire. Transitional dilator set exchanged for 5 French sheath over 0.035 inch guidewire. VS 1 catheter utilized to select the superior mesenteric artery. Angiogram demonstrated no active extravasation. VS 1 catheter then utilized to select the celiac artery. Angiogram of the celiac artery demonstrated patent splenic, common hepatic, and left  gastric branches. Active extravasation was seen in the region of the gastric duodenal artery. Progreat microcatheter was utilized to access the gastric duodenal artery. Two main branches were identified, 1 of which contributed to the hemorrhage. This branch was selected with the Progreat microcatheter and access was advanced distal to the site of extravasation. Embolization was performed with detachable Ruby coils. One 2 mm, three 3 mm, two 4 mm, and one 5 mm coil was used. Post embolization angiogram demonstrated no additional extravasation. Additional pancreaticoduodenal branch originating from the GDA was selected and angiograms performed. No extravasation was identified. Progreat microcatheter retracted to the common hepatic artery. Angiogram was performed. No extravasation identified. Progreat and VS 1 catheter were removed and sheath angiogram was performed. Given the diminutive size of the vessels, no closure device was utilized. Right groin sheath was removed and hemostasis achieved with 20 minutes of manual compression. IMPRESSION: Mesenteric angiogram revealed active extravasation from gastric duodenal artery branch, which was treated with coil embolization. Electronically Signed   By: Miachel Roux M.D.   On: 11/03/2020 13:35   DG CHEST PORT 1 VIEW  Result Date: 11/18/2020 CLINICAL DATA:  Intubated EXAM: PORTABLE CHEST 1 VIEW COMPARISON:  11/10/2020, 11/09/2020 FINDINGS: Interval intubation, tip of the endotracheal tube about a cm superior to the carina. Right upper extremity central venous catheter tip over the SVC. Esophageal tube tip below the diaphragm but incompletely visualized. Slightly improved aeration at the bases. Hazy left lung base opacity may reflect pleural fluid, atelectasis or minimal infiltrate. IMPRESSION: Interval intubation with tip of the endotracheal tube about a cm superior to the carina. Slightly improved aeration at the bases. Electronically Signed   By: Donavan Foil M.D.    On: 10/29/2020  00:43   IR EMBO ART  VEN HEMORR LYMPH EXTRAV  INC GUIDE ROADMAPPING  Result Date: 10/29/2020 INDICATION: 60 year old gentleman with light threatening acute upper GI bleed presents today measure radiology for angiogram and possible embolization. Upper endoscopy showed multiple duodenal ulcers. EXAM: 1. Ultrasound-guided access of right common femoral artery. 2. Superior mesenteric angiogram 3. Celiac angiogram 4. Gastro duodenal artery angiogram and branch coil embolization MEDICATIONS: None ANESTHESIA/SEDATION: None CONTRAST:  90 mL of Omnipaque 300 intra arterial FLUOROSCOPY TIME:  Fluoroscopy Time: 12 minutes 42 seconds (889 mGy). COMPLICATIONS: None immediate. PROCEDURE: Informed consent was obtained from the patient's sister following explanation of the procedure, risks, benefits and alternatives. The patient's sister understands, agrees and consents for the procedure. All questions were addressed. The patient was intubated prior to the procedure and could not be consented himself. Given patient's hemorrhage and hypertension, emergent consent was obtained from patient's sister. A time out was performed prior to the initiation of the procedure. Maximal barrier sterile technique utilized including caps, mask, sterile gowns, sterile gloves, large sterile drape, hand hygiene, and chlorhexidine prep. Right groin prepped and draped in usual fashion. Ultrasound image documenting patency of the right common femoral artery was obtained and placed in permanent medical record. Sterile ultrasound gel and probe cover utilized throughout the procedure. Using continuous ultrasound guidance, the right common femoral artery was accessed with a 21 gauge needle. A 0.018 inch guidewire advanced through the 21 gauge needle without resistance. 21 gauge needle exchanged for transitional dilator set over 0.018 inch guidewire. Transitional dilator set exchanged for 5 French sheath over 0.035 inch guidewire. VS 1  catheter utilized to select the superior mesenteric artery. Angiogram demonstrated no active extravasation. VS 1 catheter then utilized to select the celiac artery. Angiogram of the celiac artery demonstrated patent splenic, common hepatic, and left gastric branches. Active extravasation was seen in the region of the gastric duodenal artery. Progreat microcatheter was utilized to access the gastric duodenal artery. Two main branches were identified, 1 of which contributed to the hemorrhage. This branch was selected with the Progreat microcatheter and access was advanced distal to the site of extravasation. Embolization was performed with detachable Ruby coils. One 2 mm, three 3 mm, two 4 mm, and one 5 mm coil was used. Post embolization angiogram demonstrated no additional extravasation. Additional pancreaticoduodenal branch originating from the GDA was selected and angiograms performed. No extravasation was identified. Progreat microcatheter retracted to the common hepatic artery. Angiogram was performed. No extravasation identified. Progreat and VS 1 catheter were removed and sheath angiogram was performed. Given the diminutive size of the vessels, no closure device was utilized. Right groin sheath was removed and hemostasis achieved with 20 minutes of manual compression. IMPRESSION: Mesenteric angiogram revealed active extravasation from gastric duodenal artery branch, which was treated with coil embolization. Electronically Signed   By: Miachel Roux M.D.   On: 11/03/2020 13:35   [place] EKG SITE RITE  Result Date: 10/24/2020 If Site Rite image not attached, placement could not be confirmed due to current cardiac rhythm.    Imaging independently reviewed in Epic.    [facility] [phone] pager 11/03/2020, 8:42 AM

## 2020-11-03 NOTE — Progress Notes (Signed)
NAME:  Ronald Brady, MRN:  [id number], DOB:  11/10/1960, LOS: 12 ADMISSION DATE:  11/06/2020, CONSULTATION DATE:  4/3 REFERRING MD:  [facility], CHIEF COMPLAINT:  sepsis   History of Present Illness:  60 y/o M, smoker, with poorly controlled DM who presented to [facility] 4/2 after being found at home in a chair covered in urine & feces.  Seen with multiple wounds. He remains critically ill in the [facility] ICU.  Pertinent  Medical History  Tobacco Abuse - began smoking at 53 Worked as a logger  Poorly controlled DM - Hgb A1c 11.8 in 07/2019  HTN   Significant Hospital Events: Including procedures, antibiotic start and stop dates in addition to other pertinent events    4/02 presented to ER in shock due to multiple abscess (right thigh, left wrist), DKA. CT femur found elongated gas and fluid collection in the right hamstring c/w abscess. No evidence of osteomyelitis. Treated with clinda, meropenem, cultured, central line placed. I&D in ER.   4/03 Wrist CT with gas throughout the SQ tissues in the anterior distal forearm. Ortho consulted > taken to OR for debridement of right posterior hamstring and left forearm. Intraoperative cultures obtained. Cultures growing staph aureus (sensitivities pending). Vanco added but abx narrowed to cefazolin late pm  4/04 Ortho note reflects concern for poor wound healing, may need hip disarticulation. On levophed 1-2 mcg, BCID with staph aureus, UC 100k staph aureus.  ECHO 60-65% EF, no evidence of vegetation mentioned  4/5 CT Femur w/o contrast with multilocular fluid collection in the lower hamstrings muscles at the level of the mid to distal femur with foci of SQ emphysema  4/6 GIB overnight, Hgb down to 5.9, tx 2 units PRBC's, 1 unit FFP, PPI gtt initiated + IVF.  Follow up Hgb 7.3, remains on low dose pressors. GI consulted.   4/8 Repeat I&D of left wrist and right thigh, extensive purulent material with new abscess seen near pelvis   4/9 weaning pressors,  decreased meal time insulin  4/10 intermittent hypotension, stop diuresis  4/11 on pressors, MAP above goal, wean  4/12 almost off NE, I&D of abscess rt knee and hamstring, L FA dressing change  PM 4/12-4/13: Hypotensive, increased NE, HGB drop to 3.8, large melanotic stool, intubated, fem CVC placement, aline placement, OGT bright red blood. Total 10 PRBC, 4 FFP, 2 plt per chart. 1 dose ceftriaxone. EGD Marca Ancona MD) Non bleeding duodenal ulcer, injected, suctioned 1350 of fresh blood with cont bleeding. IR Active extrav from Gastroduodenal artery branch, coil and embo completed. Large volume resuscitation.   Interim History / Subjective:  Hypoglycemic overnight, lantus held, D10 GTT at 50 started  Remains intubated  Fentanyl  Levophed  24 Hour documented vitals: Tmax 100, HR 89-117, RR 17-29, BP 106/66-125/74  Unable to obtain Subjective history due to patient status Objective   Blood pressure 113/62, pulse (!) 105, temperature 99.5 F (37.5 C), resp. rate (!) 0, height 5\' 5"  (1.651 m), weight 89.5 kg, SpO2 100 %. CVP:  [10 mmHg-14 mmHg] 14 mmHg  Vent Mode: PSV;CPAP FiO2 (%):  [40 %-60 %] 40 % Set Rate:  [24 bmp] 24 bmp Vt Set:  [480 mL] 480 mL PEEP:  [5 cmH20-8 cmH20] 5 cmH20 Pressure Support:  [8 cmH20] 8 cmH20 Plateau Pressure:  [21 cmH20-22 cmH20] 21 cmH20   Intake/Output Summary (Last 24 hours) at 11/03/2020 1054 Last data filed at 11/03/2020 0800 Gross per 24 hour  Intake 2356.02 ml  Output 925  ml  Net 1431.02 ml   Filed Weights   10/24/20 0515 10/30/2020 1740 11/10/2020 1400  Weight: 75.7 kg 75.7 kg 89.5 kg    Examination: General: Sedated, appears comfortable, ill appearing, in bed  HEENT: MM pink/moist, scleral edema improved, anicteric, ETT, OGT Neuro: GCS 11T, RASS 0, MAE, PERRL 23mm  CV: s1s2, ST, no m/r/g appreciate PULM: Clear upper lobes, diminished in bases, chest expansion symmetric, clear secretions GI: soft, bsx4 active, non  distended Renal: foley in place, yellow urine, clear  Extremities: warm/dry, +2-3 pretibial edema, cap refill less than than 3 seconds  Skin: X3 WV to RLA, ace bandage to FA, rt picc, L fem cvc and a line  Labs/imaging that I have  CMP, CBC, CBG, lactic acid  Resolved Hospital Problem list     Assessment & Plan:  Hemorraghic Shock, secondary to GIB Duodenal Ulcer Acute Blood Loss Anemia  Thrombocytopenia  Lactate 4.8>3.5. Weaning off levophed, off Vaso/Neo. HGB 7.2>3.8>6.9>13.5>10.1 PLT 74>50, no obvious signs of bleeding. Suspect dilutional due after 1L bolus yesterday and 50g albumin.  -AM CBC or PRN if signs of blood loss/hd instability. Continue to trend. Transfuse HBG if less than 7 -Continue Levophed. Wean to goal MAP of 65 -Appreciate GI and IR assistance, will follow for recommendations. -Switching to BID protonix IV injection due to compatibility issues and lack of line access.  Septic Shock secondary MSSA Abscess of R Thigh, L Wrist with MSSA, MSSA bacteremia Volume replaced. Weaning off pressors. Lactate trending down. -Continue Cefazolin. Appreciate ID assistance. Will follow for recs -TEE planned for Friday at 1100 -Continue Vasopressors as above -Ortho planned VAC changes 3 times a week for thigh, knee wound vac present.   Acute Metabolic Encephalopathy Improving. Suspect due to hypotension. -Continue PAD bundle with Fentanyl GTT and versed pushes. Wean sedation to goal. Goal rass 0 to -1. -Continue Daily SAT/SBT  Acute Respiratory Failure with Hypoxia Need for airway protection -Continuing MV with low TV strategy -Daily SAT/SBT -PAD bundle as discussed -Continue PAD bundle with pulmonary hygiene and oral care. -Hope to extubate tomorrow post TEE -Plan to diurese with Lasix 40mg  in afternoon if off pressors.   Acute Kidney Injury Suspect secondary to hypotension. 10/24/2020 Creat .50, 4/14 1.03 -BMP in am in afternoon and AM to monitor creatinine -Goal map  65, avoid nephrotoxic drugs as possible   Poorly Controlled DM with Hyperglycemia Hyperglycemic on admit with glucose >900, small ketones in urine, A1C 12.9. Hypoglycemia overnight. -initiating TF today, will hold again at midnight for TEE, will stop d10 drip -Stopping lantus at this time. Appreciate Diabetes assistance. -Continue q4h BG monitoring.   Electrolyte abnormalities: Hypokalemia, Hyponatremia, Hypomagnesemia  -Follow up afternoon and AM bmp, replete and replace as indicated -Stopping D10 drip  Active Smoker, Suspected COPD / Asthmatic Bronchitis -Continue Duoneb and ICS -Smoking cessation education when appropriate.   Best practice   Diet:  Tube Feed  Pain/Anxiety/Delirium protocol (if indicated): Yes (RASS goal 0) VAP protocol (if indicated): Yes DVT prophylaxis: SCD given recent GI bleed and need for pressors GI prophylaxis: PPI Glucose control:  SSI Yes Central venous access:  Yes, and it is still needed DC CVC Arterial line:  Yes, and it is still needed Foley:  Yes, and it is still needed Mobility:  bed rest  PT consulted: Yes Last date of multidisciplinary goals of care discussion: Spoke with Robin at bedside 4/14 Code Status:  full code Disposition: ICU    CRITICAL CARE Performed by: 5/14  Total critical care time: 37 Minutes  Gershon Mussel., MSN, APRN, AGACNP-BC [facility]  11/03/2020 , 10:54 AM   Please see [email or web address] for pager details  From 7a-7p if no response, please call [phone] After hours, please call Elink at [phone]

## 2020-11-03 NOTE — Progress Notes (Signed)
[facility] Gastroenterology Progress Note  Ronald Brady 60 y.o. 1960-08-07  CC:  Upper GI bleeding  Subjective: Patient intubated but awake.  Denies pain.  Per RN, no bloody bowel movements thus far today.  ROS : Review of Systems  Unable to perform ROS: Intubated   Objective: Vital signs in last 24 hours: Vitals:   11/03/20 0800 11/03/20 0900  BP: 123/68 113/62  Pulse: (!) 112 (!) 105  Resp: 14 (!) 0  Temp: 99.68 F (37.6 C) 99.5 F (37.5 C)  SpO2: 100% 100%    Physical Exam:  General:  Ill-appearing, intubated but awake  Head:  Normocephalic, without obvious abnormality, atraumatic  Eyes:  Anicteric sclera, EOMs intact  Lungs:   Respirations unlabored  Heart:  Mildly tachycardic  Abdomen:   Soft, non-distended, mild epigastric tenderness to palpation     Lab Results: Recent Labs    11/16/2020 0958 11/03/20 0517  NA 138 134*  K 4.5 4.1  CL 108 106  CO2 18* 22  GLUCOSE 312* 122*  BUN 23* 31*  CREATININE 0.82 1.03  CALCIUM 6.5* 6.7*  MG 1.5*  --   PHOS 6.9*  --    Recent Labs    11/06/2020 0958 11/03/20 0517  AST 140* 62*  ALT 37 19  ALKPHOS 75 48  BILITOT 0.9 0.9  PROT 3.8* 3.9*  ALBUMIN 1.9* 2.3*   Recent Labs    11/15/2020 0456 11/12/2020 1511 11/16/2020 1552 11/03/20 0517  WBC 5.1   < > 10.8* 7.3  NEUTROABS 3.1  --   --   --   HGB 4.6*   < > 13.6 10.1*  HCT 14.8*   < > 38.7* 29.0*  MCV 92.5   < > 85.6 86.3  PLT 169   < > 74* 50*   < > = values in this interval not displayed.   Recent Labs    11/14/2020 0105 11/13/2020 0958  LABPROT 25.4* 19.4*  INR 2.3* 1.6*     Assessment: Upper GI bleeding s/p gastroduodenal artery embolization 11/03/19 and EGD 11/03/19: non-bleeding duodenal ulcer with a visible vessel, injected, hemostatic spray applied.  Non-bleeding duodenal ulcer with adherent clot, Injected, hemostatic spray applied. -Hgb 10.1, decreased from 13.5 yesterday but no overt GI bleeding at this time  Plan: Continue IV Protonix  drip.  Continue to monitor H&H with transfusion as needed to maintain Hgb >7.  Do not recommend NGT or OGT given recent GI bleeding due to duodenal ulcers and risk of migration of tube into duodenum.  Recommend TNA.  [facility] will follow.  Edrick Kins PA-C 11/03/2020, 9:54 AM  Contact #  [phone]

## 2020-11-03 NOTE — Progress Notes (Signed)
eLink Physician-Brief Progress Note Patient Name: Ronald Brady DOB: Oct 27, 1960 MRN: [id number]   Date of Service  11/03/2020  HPI/Events of Note  Nursing reports scrotum is twice the size since beginning of shift.   eICU Interventions  Plan: 1. Elevate scrotum. 2. PCCM day rounding team to examine at bedside and decide on further management.      Intervention Category Major Interventions: Other:  Ronald Brady 11/03/2020, 6:13 AM

## 2020-11-03 NOTE — Progress Notes (Signed)
Referring Physician(s): Martina Sinner (CCM)  Supervising Physician: Richarda Overlie  Patient Status:  [facility] - In-pt  Chief Complaint: GI bleed embolization F/U  Subjective:  History of acute upper GI bleed secondary to multiple duodenal ulcers s/p mesenteric arteriogram with embolization of GDA/branches using primary coils via right femoral approach 10/26/2020 by Dr. Bryn Gulling. Patient laying in bed resting comfortably, intubated with sedation. He opens eyes to voice. Male family member at bedside. Right femoral puncture site c/d/i. Hgb 10.1 today.   Allergies: Codeine and Penicillins  Medications: Prior to Admission medications   Medication Sig Start Date End Date Taking? Authorizing Provider  albuterol (PROAIR HFA) 108 (90 Base) MCG/ACT inhaler Inhale 1 puff into the lungs every 6 (six) hours as needed for wheezing or shortness of breath.   Yes [provider]  budesonide-formoterol (SYMBICORT) 160-4.5 MCG/ACT inhaler Inhale 2 puffs into the lungs 2 (two) times daily as needed (shortness of breath/wheezing).   Yes [provider]  insulin isophane & regular human (HUMULIN 70/30 KWIKPEN) (70-30) 100 UNIT/ML KwikPen Inject 67 Units into the skin 3 (three) times daily.   Yes [provider]  metFORMIN (GLUCOPHAGE) 1000 MG tablet Take 1,000 mg by mouth 2 (two) times daily. 09/21/20  Yes [provider]  pravastatin (PRAVACHOL) 20 MG tablet Take 20 mg by mouth at bedtime. 05/26/19  Yes [provider]  HYDROcodone-acetaminophen (NORCO) 5-325 MG tablet Take 1 tablet by mouth every 6 (six) hours as needed. Patient not taking: No sig reported 09/28/20   Adonis Huguenin, NP  ibuprofen (ADVIL) 600 MG tablet Take 1 tablet (600 mg total) by mouth every 8 (eight) hours as needed for moderate pain. Patient not taking: No sig reported 09/05/20   Terrilee Files, MD  meloxicam (MOBIC) 15 MG tablet Take 1 tablet (15 mg total) by mouth daily. Patient not  taking: No sig reported 09/28/20   Adonis Huguenin, NP  nicotine (NICODERM CQ - DOSED IN MG/24 HOURS) 14 mg/24hr patch Place 1 patch (14 mg total) onto the skin daily. Patient not taking: No sig reported 08/23/19   Pokhrel, Rebekah Chesterfield, MD     Vital Signs: BP 106/65   Pulse (!) 115   Temp 99.14 F (37.3 C)   Resp 20   Ht 5\' 5"  (1.651 m)   Wt 197 lb 5 oz (89.5 kg)   SpO2 99%   BMI 32.83 kg/m   Physical Exam Vitals and nursing note reviewed.  Constitutional:      General: He is not in acute distress.    Comments: Intubated with sedation.  Cardiovascular:     Comments: Distal pulses (DPs) dopplerable bilaterally. Pulmonary:     Effort: Pulmonary effort is normal. No respiratory distress.     Comments: Intubated with sedation. Skin:    General: Skin is warm and dry.     Comments: Right femoral puncture site soft without active bleeding or hematoma.  Neurological:     Mental Status: He is alert.     Comments: Intubated with sedation. Opens eyes to voice.     Imaging: IR Angiogram Visceral Selective  Result Date: 10/28/2020 INDICATION: 60 year old gentleman with light threatening acute upper GI bleed presents today measure radiology for angiogram and possible embolization. Upper endoscopy showed multiple duodenal ulcers. EXAM: 1. Ultrasound-guided access of right common femoral artery. 2. Superior mesenteric angiogram 3. Celiac angiogram 4. Gastro duodenal artery angiogram and branch coil embolization MEDICATIONS: None ANESTHESIA/SEDATION: None CONTRAST:  90 mL of Omnipaque  300 intra arterial FLUOROSCOPY TIME:  Fluoroscopy Time: 12 minutes 42 seconds (889 mGy). COMPLICATIONS: None immediate. PROCEDURE: Informed consent was obtained from the patient's sister following explanation of the procedure, risks, benefits and alternatives. The patient's sister understands, agrees and consents for the procedure. All questions were addressed. The patient was intubated prior to the procedure and could  not be consented himself. Given patient's hemorrhage and hypertension, emergent consent was obtained from patient's sister. A time out was performed prior to the initiation of the procedure. Maximal barrier sterile technique utilized including caps, mask, sterile gowns, sterile gloves, large sterile drape, hand hygiene, and chlorhexidine prep. Right groin prepped and draped in usual fashion. Ultrasound image documenting patency of the right common femoral artery was obtained and placed in permanent medical record. Sterile ultrasound gel and probe cover utilized throughout the procedure. Using continuous ultrasound guidance, the right common femoral artery was accessed with a 21 gauge needle. A 0.018 inch guidewire advanced through the 21 gauge needle without resistance. 21 gauge needle exchanged for transitional dilator set over 0.018 inch guidewire. Transitional dilator set exchanged for 5 French sheath over 0.035 inch guidewire. VS 1 catheter utilized to select the superior mesenteric artery. Angiogram demonstrated no active extravasation. VS 1 catheter then utilized to select the celiac artery. Angiogram of the celiac artery demonstrated patent splenic, common hepatic, and left gastric branches. Active extravasation was seen in the region of the gastric duodenal artery. Progreat microcatheter was utilized to access the gastric duodenal artery. Two main branches were identified, 1 of which contributed to the hemorrhage. This branch was selected with the Progreat microcatheter and access was advanced distal to the site of extravasation. Embolization was performed with detachable Ruby coils. One 2 mm, three 3 mm, two 4 mm, and one 5 mm coil was used. Post embolization angiogram demonstrated no additional extravasation. Additional pancreaticoduodenal branch originating from the GDA was selected and angiograms performed. No extravasation was identified. Progreat microcatheter retracted to the common hepatic artery.  Angiogram was performed. No extravasation identified. Progreat and VS 1 catheter were removed and sheath angiogram was performed. Given the diminutive size of the vessels, no closure device was utilized. Right groin sheath was removed and hemostasis achieved with 20 minutes of manual compression. IMPRESSION: Mesenteric angiogram revealed active extravasation from gastric duodenal artery branch, which was treated with coil embolization. Electronically Signed   By: Acquanetta Belling M.D.   On: 11/06/2020 13:35   IR Angiogram Visceral Selective  Result Date: 11/07/2020 INDICATION: 60 year old gentleman with light threatening acute upper GI bleed presents today measure radiology for angiogram and possible embolization. Upper endoscopy showed multiple duodenal ulcers. EXAM: 1. Ultrasound-guided access of right common femoral artery. 2. Superior mesenteric angiogram 3. Celiac angiogram 4. Gastro duodenal artery angiogram and branch coil embolization MEDICATIONS: None ANESTHESIA/SEDATION: None CONTRAST:  90 mL of Omnipaque 300 intra arterial FLUOROSCOPY TIME:  Fluoroscopy Time: 12 minutes 42 seconds (889 mGy). COMPLICATIONS: None immediate. PROCEDURE: Informed consent was obtained from the patient's sister following explanation of the procedure, risks, benefits and alternatives. The patient's sister understands, agrees and consents for the procedure. All questions were addressed. The patient was intubated prior to the procedure and could not be consented himself. Given patient's hemorrhage and hypertension, emergent consent was obtained from patient's sister. A time out was performed prior to the initiation of the procedure. Maximal barrier sterile technique utilized including caps, mask, sterile gowns, sterile gloves, large sterile drape, hand hygiene, and chlorhexidine prep. Right groin prepped and draped in  usual fashion. Ultrasound image documenting patency of the right common femoral artery was obtained and placed in  permanent medical record. Sterile ultrasound gel and probe cover utilized throughout the procedure. Using continuous ultrasound guidance, the right common femoral artery was accessed with a 21 gauge needle. A 0.018 inch guidewire advanced through the 21 gauge needle without resistance. 21 gauge needle exchanged for transitional dilator set over 0.018 inch guidewire. Transitional dilator set exchanged for 5 French sheath over 0.035 inch guidewire. VS 1 catheter utilized to select the superior mesenteric artery. Angiogram demonstrated no active extravasation. VS 1 catheter then utilized to select the celiac artery. Angiogram of the celiac artery demonstrated patent splenic, common hepatic, and left gastric branches. Active extravasation was seen in the region of the gastric duodenal artery. Progreat microcatheter was utilized to access the gastric duodenal artery. Two main branches were identified, 1 of which contributed to the hemorrhage. This branch was selected with the Progreat microcatheter and access was advanced distal to the site of extravasation. Embolization was performed with detachable Ruby coils. One 2 mm, three 3 mm, two 4 mm, and one 5 mm coil was used. Post embolization angiogram demonstrated no additional extravasation. Additional pancreaticoduodenal branch originating from the GDA was selected and angiograms performed. No extravasation was identified. Progreat microcatheter retracted to the common hepatic artery. Angiogram was performed. No extravasation identified. Progreat and VS 1 catheter were removed and sheath angiogram was performed. Given the diminutive size of the vessels, no closure device was utilized. Right groin sheath was removed and hemostasis achieved with 20 minutes of manual compression. IMPRESSION: Mesenteric angiogram revealed active extravasation from gastric duodenal artery branch, which was treated with coil embolization. Electronically Signed   By: Acquanetta BellingFarhaan  Mir M.D.   On:  08/26/2020 13:35   IR Angiogram Selective Each Additional Vessel  Result Date: 2020/08/11 INDICATION: 60 year old gentleman with light threatening acute upper GI bleed presents today measure radiology for angiogram and possible embolization. Upper endoscopy showed multiple duodenal ulcers. EXAM: 1. Ultrasound-guided access of right common femoral artery. 2. Superior mesenteric angiogram 3. Celiac angiogram 4. Gastro duodenal artery angiogram and branch coil embolization MEDICATIONS: None ANESTHESIA/SEDATION: None CONTRAST:  90 mL of Omnipaque 300 intra arterial FLUOROSCOPY TIME:  Fluoroscopy Time: 12 minutes 42 seconds (889 mGy). COMPLICATIONS: None immediate. PROCEDURE: Informed consent was obtained from the patient's sister following explanation of the procedure, risks, benefits and alternatives. The patient's sister understands, agrees and consents for the procedure. All questions were addressed. The patient was intubated prior to the procedure and could not be consented himself. Given patient's hemorrhage and hypertension, emergent consent was obtained from patient's sister. A time out was performed prior to the initiation of the procedure. Maximal barrier sterile technique utilized including caps, mask, sterile gowns, sterile gloves, large sterile drape, hand hygiene, and chlorhexidine prep. Right groin prepped and draped in usual fashion. Ultrasound image documenting patency of the right common femoral artery was obtained and placed in permanent medical record. Sterile ultrasound gel and probe cover utilized throughout the procedure. Using continuous ultrasound guidance, the right common femoral artery was accessed with a 21 gauge needle. A 0.018 inch guidewire advanced through the 21 gauge needle without resistance. 21 gauge needle exchanged for transitional dilator set over 0.018 inch guidewire. Transitional dilator set exchanged for 5 French sheath over 0.035 inch guidewire. VS 1 catheter utilized to  select the superior mesenteric artery. Angiogram demonstrated no active extravasation. VS 1 catheter then utilized to select the celiac artery. Angiogram of the  celiac artery demonstrated patent splenic, common hepatic, and left gastric branches. Active extravasation was seen in the region of the gastric duodenal artery. Progreat microcatheter was utilized to access the gastric duodenal artery. Two main branches were identified, 1 of which contributed to the hemorrhage. This branch was selected with the Progreat microcatheter and access was advanced distal to the site of extravasation. Embolization was performed with detachable Ruby coils. One 2 mm, three 3 mm, two 4 mm, and one 5 mm coil was used. Post embolization angiogram demonstrated no additional extravasation. Additional pancreaticoduodenal branch originating from the GDA was selected and angiograms performed. No extravasation was identified. Progreat microcatheter retracted to the common hepatic artery. Angiogram was performed. No extravasation identified. Progreat and VS 1 catheter were removed and sheath angiogram was performed. Given the diminutive size of the vessels, no closure device was utilized. Right groin sheath was removed and hemostasis achieved with 20 minutes of manual compression. IMPRESSION: Mesenteric angiogram revealed active extravasation from gastric duodenal artery branch, which was treated with coil embolization. Electronically Signed   By: Acquanetta Belling M.D.   On: 10/21/2020 13:35   IR US Guide Vasc Access Right  Result Date: 11/08/2020 INDICATION: 60 year old gentleman with light threatening acute upper GI bleed presents today measure radiology for angiogram and possible embolization. Upper endoscopy showed multiple duodenal ulcers. EXAM: 1. Ultrasound-guided access of right common femoral artery. 2. Superior mesenteric angiogram 3. Celiac angiogram 4. Gastro duodenal artery angiogram and branch coil embolization MEDICATIONS: None  ANESTHESIA/SEDATION: None CONTRAST:  90 mL of Omnipaque 300 intra arterial FLUOROSCOPY TIME:  Fluoroscopy Time: 12 minutes 42 seconds (889 mGy). COMPLICATIONS: None immediate. PROCEDURE: Informed consent was obtained from the patient's sister following explanation of the procedure, risks, benefits and alternatives. The patient's sister understands, agrees and consents for the procedure. All questions were addressed. The patient was intubated prior to the procedure and could not be consented himself. Given patient's hemorrhage and hypertension, emergent consent was obtained from patient's sister. A time out was performed prior to the initiation of the procedure. Maximal barrier sterile technique utilized including caps, mask, sterile gowns, sterile gloves, large sterile drape, hand hygiene, and chlorhexidine prep. Right groin prepped and draped in usual fashion. Ultrasound image documenting patency of the right common femoral artery was obtained and placed in permanent medical record. Sterile ultrasound gel and probe cover utilized throughout the procedure. Using continuous ultrasound guidance, the right common femoral artery was accessed with a 21 gauge needle. A 0.018 inch guidewire advanced through the 21 gauge needle without resistance. 21 gauge needle exchanged for transitional dilator set over 0.018 inch guidewire. Transitional dilator set exchanged for 5 French sheath over 0.035 inch guidewire. VS 1 catheter utilized to select the superior mesenteric artery. Angiogram demonstrated no active extravasation. VS 1 catheter then utilized to select the celiac artery. Angiogram of the celiac artery demonstrated patent splenic, common hepatic, and left gastric branches. Active extravasation was seen in the region of the gastric duodenal artery. Progreat microcatheter was utilized to access the gastric duodenal artery. Two main branches were identified, 1 of which contributed to the hemorrhage. This branch was selected  with the Progreat microcatheter and access was advanced distal to the site of extravasation. Embolization was performed with detachable Ruby coils. One 2 mm, three 3 mm, two 4 mm, and one 5 mm coil was used. Post embolization angiogram demonstrated no additional extravasation. Additional pancreaticoduodenal branch originating from the GDA was selected and angiograms performed. No extravasation was identified. Progreat microcatheter  retracted to the common hepatic artery. Angiogram was performed. No extravasation identified. Progreat and VS 1 catheter were removed and sheath angiogram was performed. Given the diminutive size of the vessels, no closure device was utilized. Right groin sheath was removed and hemostasis achieved with 20 minutes of manual compression. IMPRESSION: Mesenteric angiogram revealed active extravasation from gastric duodenal artery branch, which was treated with coil embolization. Electronically Signed   By: Acquanetta Belling M.D.   On: 11/17/2020 13:35   DG CHEST PORT 1 VIEW  Result Date: 11/06/2020 CLINICAL DATA:  Intubated EXAM: PORTABLE CHEST 1 VIEW COMPARISON:  11/17/2020, 10/24/2020 FINDINGS: Interval intubation, tip of the endotracheal tube about a cm superior to the carina. Right upper extremity central venous catheter tip over the SVC. Esophageal tube tip below the diaphragm but incompletely visualized. Slightly improved aeration at the bases. Hazy left lung base opacity may reflect pleural fluid, atelectasis or minimal infiltrate. IMPRESSION: Interval intubation with tip of the endotracheal tube about a cm superior to the carina. Slightly improved aeration at the bases. Electronically Signed   By: Jasmine Pang M.D.   On: 11/08/2020 00:43   IR EMBO ART  VEN HEMORR LYMPH EXTRAV  INC GUIDE ROADMAPPING  Result Date: 11/16/2020 INDICATION: 60 year old gentleman with light threatening acute upper GI bleed presents today measure radiology for angiogram and possible embolization. Upper  endoscopy showed multiple duodenal ulcers. EXAM: 1. Ultrasound-guided access of right common femoral artery. 2. Superior mesenteric angiogram 3. Celiac angiogram 4. Gastro duodenal artery angiogram and branch coil embolization MEDICATIONS: None ANESTHESIA/SEDATION: None CONTRAST:  90 mL of Omnipaque 300 intra arterial FLUOROSCOPY TIME:  Fluoroscopy Time: 12 minutes 42 seconds (889 mGy). COMPLICATIONS: None immediate. PROCEDURE: Informed consent was obtained from the patient's sister following explanation of the procedure, risks, benefits and alternatives. The patient's sister understands, agrees and consents for the procedure. All questions were addressed. The patient was intubated prior to the procedure and could not be consented himself. Given patient's hemorrhage and hypertension, emergent consent was obtained from patient's sister. A time out was performed prior to the initiation of the procedure. Maximal barrier sterile technique utilized including caps, mask, sterile gowns, sterile gloves, large sterile drape, hand hygiene, and chlorhexidine prep. Right groin prepped and draped in usual fashion. Ultrasound image documenting patency of the right common femoral artery was obtained and placed in permanent medical record. Sterile ultrasound gel and probe cover utilized throughout the procedure. Using continuous ultrasound guidance, the right common femoral artery was accessed with a 21 gauge needle. A 0.018 inch guidewire advanced through the 21 gauge needle without resistance. 21 gauge needle exchanged for transitional dilator set over 0.018 inch guidewire. Transitional dilator set exchanged for 5 French sheath over 0.035 inch guidewire. VS 1 catheter utilized to select the superior mesenteric artery. Angiogram demonstrated no active extravasation. VS 1 catheter then utilized to select the celiac artery. Angiogram of the celiac artery demonstrated patent splenic, common hepatic, and left gastric branches. Active  extravasation was seen in the region of the gastric duodenal artery. Progreat microcatheter was utilized to access the gastric duodenal artery. Two main branches were identified, 1 of which contributed to the hemorrhage. This branch was selected with the Progreat microcatheter and access was advanced distal to the site of extravasation. Embolization was performed with detachable Ruby coils. One 2 mm, three 3 mm, two 4 mm, and one 5 mm coil was used. Post embolization angiogram demonstrated no additional extravasation. Additional pancreaticoduodenal branch originating from the GDA was selected and  angiograms performed. No extravasation was identified. Progreat microcatheter retracted to the common hepatic artery. Angiogram was performed. No extravasation identified. Progreat and VS 1 catheter were removed and sheath angiogram was performed. Given the diminutive size of the vessels, no closure device was utilized. Right groin sheath was removed and hemostasis achieved with 20 minutes of manual compression. IMPRESSION: Mesenteric angiogram revealed active extravasation from gastric duodenal artery branch, which was treated with coil embolization. Electronically Signed   By: Acquanetta Belling M.D.   On: 10/31/2020 13:35   [place] EKG SITE RITE  Result Date: 11/18/2020 If Site Rite image not attached, placement could not be confirmed due to current cardiac rhythm.   Labs:  CBC: Recent Labs    10/21/2020 0105 11/09/2020 0958 11/06/2020 1552 11/03/20 0517  WBC 20.1* 11.4* 10.8* 7.3  HGB 6.9* 13.5 13.6 10.1*  HCT 21.8* 39.0 38.7* 29.0*  PLT 140* 61* 74* 50*    COAGS: Recent Labs    10/26/20 0227 11/18/2020 0105 11/06/2020 0958  INR 1.4* 2.3* 1.6*  APTT 29  --  33    BMP: Recent Labs    10/30/20 0606 11/04/2020 0456 10/30/2020 0958 11/03/20 0517  NA 135 139 138 134*  K 4.1 3.1* 4.5 4.1  CL 104 108 108 106  CO2 26 25 18* 22  GLUCOSE 179* 124* 312* 122*  BUN 26* 25* 23* 31*  CALCIUM 6.7* 7.5* 6.5* 6.7*   CREATININE 0.64 0.62 0.82 1.03  GFRNONAA >60 >60 >60 >60    LIVER FUNCTION TESTS: Recent Labs    10/26/20 0353 10/31/2020 0456 11/13/2020 0958 11/03/20 0517  BILITOT 0.7 <0.1* 0.9 0.9  AST 11* 11* 140* 62*  ALT 7 <5 37 19  ALKPHOS 107 109 75 48  PROT 3.6* 4.4* 3.8* 3.9*  ALBUMIN 1.2* 1.2* 1.9* 2.3*    Assessment and Plan:  History of acute upper GI bleed secondary to multiple duodenal ulcers s/p mesenteric arteriogram with embolization of GDA/branches using primary coils via right femoral approach 11/11/2020 by Dr. Bryn Gulling. Right femoral puncture site stable, distal pulses (DPs) dopplerable bilaterally. Further plans per CCM/GI/orthopedics- appreciate and agree with management. Please call IR with questions/concerns.   Electronically Signed: Elwin Mocha, PA-C 11/03/2020, 12:22 PM   I spent a total of 15 Minutes at the the patient's bedside AND on the patient's hospital floor or unit, greater than 50% of which was counseling/coordinating care for acute upper GI bleed s/p embolization.

## 2020-11-04 ENCOUNTER — Inpatient Hospital Stay (HOSPITAL_COMMUNITY): Payer: Medicaid Other

## 2020-11-04 DIAGNOSIS — J9601 Acute respiratory failure with hypoxia: Secondary | ICD-10-CM

## 2020-11-04 DIAGNOSIS — J9690 Respiratory failure, unspecified, unspecified whether with hypoxia or hypercapnia: Secondary | ICD-10-CM

## 2020-11-04 DIAGNOSIS — L02414 Cutaneous abscess of left upper limb: Secondary | ICD-10-CM | POA: Diagnosis not present

## 2020-11-04 DIAGNOSIS — L02415 Cutaneous abscess of right lower limb: Secondary | ICD-10-CM | POA: Diagnosis not present

## 2020-11-04 DIAGNOSIS — R6521 Severe sepsis with septic shock: Secondary | ICD-10-CM | POA: Diagnosis not present

## 2020-11-04 DIAGNOSIS — N179 Acute kidney failure, unspecified: Secondary | ICD-10-CM | POA: Diagnosis not present

## 2020-11-04 DIAGNOSIS — R509 Fever, unspecified: Secondary | ICD-10-CM

## 2020-11-04 DIAGNOSIS — A4101 Sepsis due to Methicillin susceptible Staphylococcus aureus: Secondary | ICD-10-CM | POA: Diagnosis not present

## 2020-11-04 DIAGNOSIS — A419 Sepsis, unspecified organism: Secondary | ICD-10-CM | POA: Diagnosis not present

## 2020-11-04 LAB — BASIC METABOLIC PANEL
Anion gap: 4 — ABNORMAL LOW (ref 5–15)
BUN: 31 mg/dL — ABNORMAL HIGH (ref 6–20)
CO2: 21 mmol/L — ABNORMAL LOW (ref 22–32)
Calcium: 6.8 mg/dL — ABNORMAL LOW (ref 8.9–10.3)
Chloride: 111 mmol/L (ref 98–111)
Creatinine, Ser: 0.97 mg/dL (ref 0.61–1.24)
GFR, Estimated: >60 mL/min (ref >60)
Glucose, Bld: 303 mg/dL — ABNORMAL HIGH (ref 70–99)
Potassium: 3.4 mmol/L — ABNORMAL LOW (ref 3.5–5.1)
Sodium: 136 mmol/L (ref 135–145)

## 2020-11-04 LAB — CBC
HCT: 30 % — ABNORMAL LOW (ref 39.0–52.0)
HCT: 31.3 % — ABNORMAL LOW (ref 39.0–52.0)
Hemoglobin: 10.1 g/dL — ABNORMAL LOW (ref 13.0–17.0)
Hemoglobin: 10.2 g/dL — ABNORMAL LOW (ref 13.0–17.0)
MCH: 30.1 pg (ref 26.0–34.0)
MCH: 29.7 pg (ref 26.0–34.0)
MCHC: 33.7 g/dL (ref 30.0–36.0)
MCHC: 32.6 g/dL (ref 30.0–36.0)
MCV: 89.6 fL (ref 80.0–100.0)
MCV: 91.3 fL (ref 80.0–100.0)
Platelets: 45 10*3/uL — ABNORMAL LOW (ref 150–400)
Platelets: 41 10*3/uL — ABNORMAL LOW (ref 150–400)
RBC: 3.35 MIL/uL — ABNORMAL LOW (ref 4.22–5.81)
RBC: 3.43 MIL/uL — ABNORMAL LOW (ref 4.22–5.81)
RDW: 16.5 % — ABNORMAL HIGH (ref 11.5–15.5)
RDW: 16.6 % — ABNORMAL HIGH (ref 11.5–15.5)
WBC: 2.8 10*3/uL — ABNORMAL LOW (ref 4.0–10.5)
WBC: 9.2 10*3/uL (ref 4.0–10.5)
nRBC: 1.1 % — ABNORMAL HIGH (ref 0.0–0.2)
nRBC: 0 % (ref 0.0–0.2)

## 2020-11-04 LAB — GLUCOSE, CAPILLARY
Glucose-Capillary: 107 mg/dL — ABNORMAL HIGH (ref 70–99)
Glucose-Capillary: 148 mg/dL — ABNORMAL HIGH (ref 70–99)
Glucose-Capillary: 151 mg/dL — ABNORMAL HIGH (ref 70–99)
Glucose-Capillary: 166 mg/dL — ABNORMAL HIGH (ref 70–99)
Glucose-Capillary: 168 mg/dL — ABNORMAL HIGH (ref 70–99)
Glucose-Capillary: 186 mg/dL — ABNORMAL HIGH (ref 70–99)
Glucose-Capillary: 189 mg/dL — ABNORMAL HIGH (ref 70–99)

## 2020-11-04 LAB — DIC (DISSEMINATED INTRAVASCULAR COAGULATION)PANEL
D-Dimer, Quant: 2.65 ug/mL-FEU — ABNORMAL HIGH (ref 0.00–0.50)
Fibrinogen: 433 mg/dL (ref 210–475)
INR: 1.4 — ABNORMAL HIGH (ref 0.8–1.2)
Platelets: 42 10*3/uL — ABNORMAL LOW (ref 150–400)
Prothrombin Time: 16.8 seconds — ABNORMAL HIGH (ref 11.4–15.2)
Smear Review: NONE SEEN
aPTT: 38 seconds — ABNORMAL HIGH (ref 24–36)

## 2020-11-04 LAB — CALCIUM, IONIZED: Calcium, Ionized, Serum: 3.8 mg/dL — ABNORMAL LOW (ref 4.5–5.6)

## 2020-11-04 LAB — MAGNESIUM: Magnesium: 1.6 mg/dL — ABNORMAL LOW (ref 1.7–2.4)

## 2020-11-04 LAB — C-REACTIVE PROTEIN: CRP: 27.8 mg/dL — ABNORMAL HIGH (ref <1.0)

## 2020-11-04 LAB — CORTISOL: Cortisol, Plasma: 63.1 ug/dL

## 2020-11-04 LAB — PHOSPHORUS: Phosphorus: 3.9 mg/dL (ref 2.5–4.6)

## 2020-11-04 MED ORDER — POTASSIUM CHLORIDE 10 MEQ/50ML IV SOLN
10.0000 meq | INTRAVENOUS | Status: AC
  Administered 2020-11-04 (×4): 10 meq via INTRAVENOUS
  Filled 2020-11-04 (×4): qty 50

## 2020-11-04 MED ORDER — DEXTROSE 10 % IV SOLN
INTRAVENOUS | Status: DC
  Filled 2020-11-04: qty 1000

## 2020-11-04 MED ORDER — VASOPRESSIN 20 UNITS/100 ML INFUSION FOR SHOCK
0.0300 [IU]/min | INTRAVENOUS | Status: DC
  Administered 2020-11-04 – 2020-11-05 (×2): 0.03 [IU]/min via INTRAVENOUS
  Filled 2020-11-04 (×2): qty 100

## 2020-11-04 MED ORDER — MAGNESIUM SULFATE 4 GM/100ML IV SOLN
4.0000 g | Freq: Once | INTRAVENOUS | Status: AC
Start: 2020-11-04 — End: 2020-11-04
  Administered 2020-11-04: 4 g via INTRAVENOUS
  Filled 2020-11-04: qty 100

## 2020-11-04 NOTE — Progress Notes (Signed)
   Asked to evaluate for TEE.  Given recent GI bleed, upper, and PLT's of <50 he is not currently a candidate for TEE (both are contraindications).  Please reach out when conditions are more favorable.   Donato Schultz, MD

## 2020-11-04 NOTE — Evaluation (Signed)
SLP Cancellation Note  Patient Details Name: Ronald Brady MRN: [id number] DOB: 18-Feb-1961   Cancelled treatment:       Reason Eval/Treat Not Completed: Other (comment) (New order received, pt just extubated today, he had dysphagia prior to intubation and thus would advise to wait for evaluation until next date am 4/16- spoke to RN who was in agreement.  RN will phone SLP if MD disagrees with plan. Thanks.)  Rolena Infante, MS [facility] SLP Acute Rehab Services Office [phone] Pager [phone]   Chales Abrahams 11/04/2020, 12:20 PM

## 2020-11-04 NOTE — Progress Notes (Signed)
eLink Physician-Brief Progress Note Patient Name: Ronald Brady DOB: 1961-07-18 MRN: [id number]   Date of Service  11/04/2020  HPI/Events of Note  CXR and KUB reviewed.  eICU Interventions  No intervention.        Thomasene Lot Auron Tadros 11/04/2020, 4:11 AM

## 2020-11-04 NOTE — Progress Notes (Signed)
Whitman Meinhardt Shryock 11:53 AM  Subjective: Patient seen and examined and case discussed with my partner Dr. Bosie Clos in his hospital computer chart reviewed including endoscopic pictures and angiogram and his case discussed with the patient's sister who is at the bedside and case discussed with the ICU team as well currently without signs of bleeding  Objective: Vital signs stable afebrile intubated but shakes his head yes and no seemingly appropriately abdomen is a little firm mild tender throughout no obvious rebound hemoglobin stable BUN and creatinine stable platelet count decreased  Assessment: Multiple medical problems including significant duodenal ulcer bleeding  Plan: Continue management by ICU team if extubated and passes swallowing study would begin clear liquids and I will check on tomorrow but call me sooner if question or problem  [facility] E  office [phone] After 5PM or if no answer call [phone]

## 2020-11-04 NOTE — Progress Notes (Signed)
K+ 3.4, Mg 1.6 Replaced per protocol

## 2020-11-04 NOTE — Progress Notes (Signed)
NAME:  Ronald Brady, MRN:  [id number], DOB:  1961-03-30, LOS: 13 ADMISSION DATE:  10/26/2020, CONSULTATION DATE:  4/3 REFERRING MD:  [facility], CHIEF COMPLAINT:  sepsis   History of Present Illness:  60 y/o M, smoker, with poorly controlled DM who presented to [facility] 4/2 after being found at home in a chair covered in urine & feces.  Seen with multiple wounds. He remains in critical condition in the [facility] ICU.  Pertinent  Medical History  Tobacco Abuse - began smoking at 27 Worked as a logger  Poorly controlled DM - Hgb A1c 11.8 in 07/2019  HTN   Significant Hospital Events: Including procedures, antibiotic start and stop dates in addition to other pertinent events    4/02 presented to ER in shock due to multiple abscess (right thigh, left wrist), DKA. CT femur found elongated gas and fluid collection in the right hamstring c/w abscess. No evidence of osteomyelitis. Treated with clinda, meropenem, cultured, central line placed. I&D in ER.   4/03 Wrist CT with gas throughout the SQ tissues in the anterior distal forearm. Ortho consulted > taken to OR for debridement of right posterior hamstring and left forearm. Intraoperative cultures obtained. Cultures growing staph aureus (sensitivities pending). Vanco added but abx narrowed to cefazolin late pm  4/04 Ortho note reflects concern for poor wound healing, may need hip disarticulation. On levophed 1-2 mcg, BCID with staph aureus, UC 100k staph aureus.  ECHO 60-65% EF, no evidence of vegetation mentioned  4/5 CT Femur w/o contrast with multilocular fluid collection in the lower hamstrings muscles at the level of the mid to distal femur with foci of SQ emphysema  4/6 GIB overnight, Hgb down to 5.9, tx 2 units PRBC's, 1 unit FFP, PPI gtt initiated + IVF.  Follow up Hgb 7.3, remains on low dose pressors. GI consulted.   4/8 Repeat I&D of left wrist and right thigh, extensive purulent material with new abscess seen near pelvis   4/9 weaning  pressors, decreased meal time insulin  4/10 intermittent hypotension, stop diuresis  4/11 on pressors, MAP above goal, wean  4/12 almost off NE, I&D of abscess rt knee and hamstring, L FA dressing change  PM 4/12-4/13: Hypotensive, increased NE, HGB drop to 3.8, large melanotic stool, intubated, fem CVC placement, aline placement, OGT bright red blood. Total 10 PRBC, 4 FFP, 2 plt per chart. 1 dose ceftriaxone. EGD Marca Ancona MD) Non bleeding duodenal ulcer, injected, suctioned 1350 of fresh blood with cont bleeding. IR Active extrav from Gastroduodenal artery branch, coil and embo completed. Large volume resuscitation.  Interim History / Subjective:  Episode of emesis overnight. No hematemesis reported.  KUB and CXR obtained. No gastric bubble or signs of new aspiration.  Remains intubated  Levophed 4 mcg, Fentanyl 200 mcg  24 hour documented vitals: Tmax 100.7, HR 105-136, RR 0-29, BP 88/61-140/67  Unable to obtain subjective evaluation due to patient status.  Objective   Blood pressure 100/62, pulse (!) 116, temperature 100.04 F (37.8 C), resp. rate (!) 25, height 5\' 5"  (1.651 m), weight 89.5 kg, SpO2 93 %. CVP:  [10 mmHg-16 mmHg] 16 mmHg  Vent Mode: PSV;CPAP FiO2 (%):  [30 %-40 %] 30 % Set Rate:  [24 bmp] 24 bmp Vt Set:  [480 mL] 480 mL PEEP:  [5 cmH20] 5 cmH20 Pressure Support:  [5 cmH20-8 cmH20] 5 cmH20 Plateau Pressure:  [13 cmH20-21 cmH20] 21 cmH20   Intake/Output Summary (Last 24 hours) at 11/04/2020 0841 Last data filed at  11/04/2020 2376 Gross per 24 hour  Intake 1713.29 ml  Output 3850 ml  Net -2136.71 ml  +16,661ml this admission  Filed Weights   10/24/20 0515 11/19/2020 1740 10/29/2020 1400  Weight: 75.7 kg 75.7 kg 89.5 kg    Examination: General: Sedated, appears comfortable, no acute distress, in bed   HEENT: MM pink/moist, scleral edema resolved, anicteric, ETT, OGT Neuro: GCS 11t, RASS -1, MAE, PERRL 73mm CV: s1s2, ST, no m/r/g PULM:  Clear upper lobes,  diminished lower lobes, clear secretions, trachea midline GI: soft, bsx4 active, non distended Renal: foley in place, yellow urine, no sediment  Extremities: warm/dry, +2-+3 pretibial edema, cap refill less than 3 seconds  Skin: 3 wound vacs to RLE, serosang drainage, L FA ace wrap.   Labs/imaging that I have reviewed  CBC, BMP, CXR, KUB, BG  Resolved Hospital Problem list     Assessment & Plan:  Hemorraghic Shock, secondary to GIB Duodenal Ulcer Acute Blood Loss Anemia  HGB 3.8>6.9>13.5>10.1> 10.1, No obvious signs of bleeding. -Obtain afternoon and am CBC. Trend H&H. -Transfuse PRBC if HBG less than 7 -Appreciate GI assistance. Continue IV protonix -Continue levophed. Goal map greater than 65. Adjust to goal. Will add back vasopressin if levophed requirements greater than 10.  Thrombocytopenia PLT 74>50>45. Low suspicion for HIT- Plt stable until 4/13, received heparin on 4/3, 4/4, 4/5. Suspect DIC post hemorrhage.  -Sending DIC panel, will follow up  -Transfuse at PLT less than 10  Leukopenia WBC 10/8>7.3>2.8, Suspect due to large volume of blood resuscitation on 4/13 (10 PRBC, 4 FFP, 2 PLT) -Afternoon CBC and AM CBC, trend and monitor.  Septic Shock secondary MSSA Abscess of R Thigh, L Wrist with MSSA, MSSA bacteremia Volume replaced. Still requiring vasopressors. -TEE cancelled by cards due to thrombocytopenia. Will need to reschedule when more stable. -Appreciate assistance from ID, continuing Ancef. If temprature over 101.5 occurs will resend trach aspirate and BC.  -WV change today, continue to follow.  Acute Metabolic Encephalopathy Improving. Suspect due to hypotension/sepsis -Continue PAD with fentanyl GTT and versed pushes. Goal RASS 0 to -1. Wean sedation to goal -Continue Daily SAT/SBT  Acute Respiratory Failure with Hypoxia Need for airway protection -LTVV strategy with tidal volumes of 4-8 cc/kg ideal body weight, with goal plateau pressures of 30 and  driving pressures of 15 -Goal SPO2 92-98% -Daily SAT/SBT. Will consider extubation based on tolerance.  -Continue PAD bundle as discussed -Continue VAP bundle with oral care, pulmonary hygiene, and suctioning.  -Holding off on diuresis due to levophed requirement.   Acute Kidney Injury Suspect secondary to hypotension. 10/24/2020 Creat .50, 4/14 1.03, now 0.97 -Ensure renal perfusion. Goal MAP 65 or greater. -Avoid neprotoxic drugs as possible. -Strict I&O's -Follow up AM creatinine -Continue foley today.  Poorly Controlled DM with Hyperglycemia Hyperglycemic on admit with glucose >900, small ketones in urine, A1C 12.9. Emesis overnight with TF -Continue SSI, will consider adding back lantus when tolerating TF -Continue d10 at 30, will restart TF in afternoon -Blood Glucose goal 140-180   Electrolyte abnormalities: Hypokalemia, Hyponatremia, Hypomagnesemia  -Obtain AM BMP, replace as needed.  Active Smoker, Suspected COPD / Asthmatic Bronchitis -Continue Duoneb and ICS -Smoking cessation when appropriate  Best practice   Diet:  NPO Pain/Anxiety/Delirium protocol (if indicated): Yes (RASS goal 0) VAP protocol (if indicated): Yes DVT prophylaxis: SCD given recent GI bleed and need for pressors GI prophylaxis: H2B and PPI Glucose control:  SSI Yes Central venous access:  Yes, and it is still  needed DC CVC Arterial line:  N/A Foley:  Yes, and it is still needed Mobility:  bed rest  PT consulted: Yes Last date of multidisciplinary goals of care discussion: Spoke with Robin at bedside 4/14, plan to speak with at bedside afternoon of 4/15. Code Status:  full code Disposition: ICU    CRITICAL CARE Performed by: Eliezer Champagne Total critical care time: 2 Minutes  Elysha Daw Haywood Lasso., MSN, APRN, AGACNP-BC [facility]  11/04/2020 , 8:41 AM   Please see [email or web address] for pager details  From 7a-7p if no response, please call [phone] After  hours, please call Elink at [phone]

## 2020-11-04 NOTE — Consult Note (Signed)
WOC Nurse Consult Note: Patient's sister is present for wound care today.  The skill and assistance of the patient's Bedside RN, E. Pottet is appreciated.  WOC Nurse wound follow up: proximal wound, near buttock) Wound type: Full thickness, infectious, surgical Measurement: Per Monday, 4.5cm x 2cm x 2.2cm Wound bed:red, moist Drainage (amount, consistency, odor) serous in small amount Periwound: intact, soft. Dressing procedure/placement/frequency: One piece of black foam removed, wound cleansed, periwound skin protected. One piece of black foam used to fill defect, covered with drape. Attached to continuous negative pressure and an immediate seal is achieved.   WOC Nurse wound follow up: posterior thigh wound Wound type: Full thickness, surgical, infectious Measurement: Per Monday, 3.5cm x 2cm x 1cm Wound bed:red, moist Drainage (amount, consistency, odor) seropurulent, thick brown/red exudate in a small to moderate amount Periwound:intact, indurated Dressing procedure/placement/frequency:One piece of black foam removed, wound cleansed, periwound skin protected. One piece of black foam used to fill defect, covered with drape. Attached to continuous negative pressure and an immediate seal is achieved.   WOC Nurse wound follow up  (NEW wound to medial right LE created intraoperatively on 11/12/2020 Dr. Ophelia Charter) Wound type: full thickness, surgical, infectious Measurement: 1.5cm x 1cm x 0.8cm Wound bed: Red, moist Drainage (amount, consistency, odor) serosanguinous Periwound: intact, indurated Dressing procedure/placement/frequency: First post op NPWT dressing change.  One piece of black foam removed, wound cleansed, periwound skin protected. One piece of black foam used to fill defect, covered with drape. An additional piece of black foam used to create a "landing zone" for T.R.A.C.C. pad. (Two pieces of black foam used in total)  Attached to continuous negative pressure   and an immediate seal is achieved.  All dressings are to be changed on a M/W/F schedule.  WOC Nurse will see on Monday for this as well as for assessment of Stage 3 left buttock pressure injury.  Supplies ordered to room (3 Medium VAC dressing kits). All VAC pumps and tubing are labeled for wound connection.  Prevalon boots ordered today for pressure redistribution.  Patient's heels are intact.  WOC nursing team will not follow, but will remain available to this patient, the nursing and medical teams.  Please re-consult if needed. Thanks, Ladona Mow, MSN, RN, GNP, [facility]  Pager# [phone]

## 2020-11-04 NOTE — Progress Notes (Signed)
[facility]  Date of Admission:  10/31/2020           Reason for visit: Follow up on MSSA bacteremia  Current antibiotics: Day 11 cefazolin (4/3--present)    ASSESSMENT:    MSSA bacteremia: In the setting of left upper extremity abscess and right lower extremity abscess.  Status post incision and drainage 10/25/2020, 11/17/2020, and 10/21/2020.  Most recent operative note reports continued purulent fluid with right lower extremity abscess.  Additionally underwent excisional debridement of left forearm tissue at that time.  His repeat blood cultures are negative from 10/24/2020 and TTE was without evidence of vegetation.  TEE is currently on hold due to other medical issues Low grade fevers overnight: likely multifactorial in setting of MSSA infection, possible aspiration overnight Multifactorial shock: Stable related to GI bleed and sepsis GI bleed: Related to bleeding duodenal ulcers.  Status post emergent EGD with epi injection hemospermia.  Status post subsequent embolization to GDA branch by interventional radiology 11/03/2020 Type 2 diabetes: A1c 12.9 Respiratory failure: Remains intubated  PLAN:    Continue cefazolin 2 g every 8 hours for MSSA coverage Continue current coverage, if continues to spike fevers would consider adding aspiration coverage and getting tracheal aspirate and blood cultures TEE when able to be performed Vasopressors, blood products, vent management, and GI bleed management per ICU and GI Glycemic control, wound care   Principal Problem:   Severe sepsis with septic shock [facility]) Active Problems:   Abscess of right thigh   Abscess of left forearm   Diabetes mellitus (HCC)   Hyperosmolar hyperglycemic state (HHS) ([place])   AKI (acute kidney injury) ([place])   Acute lower UTI   Emphysema lung ([place])   Abscess of right lower extremity   MSSA bacteremia   Pressure injury of skin   Malnutrition of moderate degree    MEDICATIONS:     Scheduled Meds: . chlorhexidine gluconate (MEDLINE KIT)  15 mL Mouth Rinse BID  . Chlorhexidine Gluconate Cloth  6 each Topical Daily  . docusate  100 mg Per Tube BID  . feeding supplement (VITAL AF 1.2 CAL)  1,000 mL Per Tube Q24H  . insulin aspart  0-15 Units Subcutaneous Q4H  . ipratropium-albuterol  3 mL Nebulization BID  . mouth rinse  15 mL Mouth Rinse 10 times per day  . midodrine  10 mg Per Tube TID WC  . mometasone-formoterol  2 puff Inhalation BID  . multivitamin  15 mL Per Tube Daily  . nicotine  21 mg Transdermal Daily  . pantoprazole (PROTONIX) IV  40 mg Intravenous Q12H  . polyethylene glycol  17 g Per Tube Daily  . sodium chloride flush  10-40 mL Intracatheter Q12H   Continuous Infusions: .  ceFAZolin (ANCEF) IV Stopped (11/04/20 0558)  . dextrose Stopped (11/04/20 0529)  . fentaNYL infusion INTRAVENOUS 200 mcg/hr (11/04/20 5400)  . norepinephrine (LEVOPHED) Adult infusion 7 mcg/min (11/04/20 0735)  . potassium chloride 10 mEq (11/04/20 0734)   PRN Meds:.oxyCODONE **AND** acetaminophen, albuterol, bisacodyl, dextrose, fentaNYL, metoCLOPramide, [DISCONTINUED] metoCLOPramide **OR** metoCLOPramide (REGLAN) injection, midazolam, [DISCONTINUED] ondansetron **OR** ondansetron (ZOFRAN) IV, ondansetron, phenol, sodium chloride flush  SUBJECTIVE:   24 hour events:  Febrile this morning, Tmax 100.7 WBC decreased to 2.8 PLT 45 Creatinine 0.97 Concern for aspiration overnight --> CXR reviewed with bibasilar infiltrates c/w pulmonary edema KUB w/o evidence of obstruction/ileus TEE on hold due to platelets and GI bleed Low dose Levophed continued.  Currently at 10  mcg PEEP 5, FiO2 30%   Unable to obtain due to being intubated but nods head no to having pain  Review of Systems  Unable to perform ROS: Intubated      OBJECTIVE:   Blood pressure 100/62, pulse (!) 116, temperature 100.04 F (37.8 C), resp. rate (!) 25, height _0  (1.651 m), weight 89.5 kg, SpO2 93  %. Body mass index is 32.83 kg/m.  Physical Exam Constitutional:      Comments: Ill appearing, on vent, NAD  HENT:     Head:     Comments: ET tube in place, NCAT Eyes:     General: No scleral icterus.    Extraocular Movements: Extraocular movements intact.     Conjunctiva/sclera: Conjunctivae normal.  Cardiovascular:     Rate and Rhythm: Normal rate and regular rhythm.     Heart sounds: No murmur heard.   Pulmonary:     Comments: Appears comfortable on vent, BS diminished at bases, ventilated Abdominal:     Palpations: Abdomen is soft.     Tenderness: There is no abdominal tenderness.     Comments: Decreased BS  Skin:    General: Skin is warm and dry.     Findings: No rash.     Comments: UE PICC line in place  Neurological:     Comments: Follows commands and nods head to questions      Lab Results: Lab Results  Component Value Date   WBC 2.8 (L) 11/04/2020   HGB 10.1 (L) 11/04/2020   HCT 30.0 (L) 11/04/2020   MCV 89.6 11/04/2020   PLT 45 (L) 11/04/2020    Lab Results  Component Value Date   NA 136 11/04/2020   K 3.4 (L) 11/04/2020   CO2 21 (L) 11/04/2020   GLUCOSE 303 (H) 11/04/2020   BUN 31 (H) 11/04/2020   CREATININE 0.97 11/04/2020   CALCIUM 6.8 (L) 11/04/2020   GFRNONAA >60 11/04/2020   GFRAA >60 08/21/2019    Lab Results  Component Value Date   ALT 19 11/03/2020   AST 62 (H) 11/03/2020   ALKPHOS 48 11/03/2020   BILITOT 0.9 11/03/2020       Component Value Date/Time   CRP 27.8 (H) 11/04/2020 0539    No results found for: ESRSEDRATE   I have reviewed the micro and lab results in Epic.  Imaging: DG Abd 1 View  Result Date: 11/04/2020 CLINICAL DATA:  Possible ileus. EXAM: ABDOMEN - 1 VIEW COMPARISON:  August 19, 2019 FINDINGS: A nasogastric tube is seen with its distal tip and distal side hole overlying the body of the stomach. There is no definite evidence of bowel dilatation. Moderate amount of stool is seen throughout the large bowel.  Radiopaque surgical coils are seen overlying the medial aspect of the mid to upper right abdomen. No radio-opaque calculi or other significant radiographic abnormality are seen. IMPRESSION: 1. Nasogastric tube positioning, as described above, without evidence of bowel obstruction or ileus. CT correlation is recommended if this remains of clinical concern. Electronically Signed   By: Virgina Norfolk M.D.   On: 11/04/2020 03:38   DG CHEST PORT 1 VIEW  Result Date: 11/04/2020 CLINICAL DATA:  Respiratory failure EXAM: PORTABLE CHEST 1 VIEW COMPARISON:  11/04/2020 FINDINGS: Endotracheal tube is seen 2.4 cm above the carina. Nasogastric tube extends into the upper abdomen. Right upper extremity PICC line tip noted within the superior vena cava. Mild bilateral perihilar and lower lung zone pulmonary infiltrates have developed suggesting mild pulmonary edema  contributing to bibasilar opacification. No pneumothorax or pleural effusion. Cardiac size within normal limits. IMPRESSION: Stable support lines and tubes. Interval development of bibasilar pulmonary infiltrates, most suggestive of pulmonary edema. Electronically Signed   By: Fidela Salisbury MD   On: 11/04/2020 03:11   [place] EKG SITE RITE  Result Date: 11/04/2020 If Site Rite image not attached, placement could not be confirmed due to current cardiac rhythm.    Imaging independently reviewed in Epic.    [facility] [phone] pager 11/04/2020, 8:29 AM

## 2020-11-04 NOTE — Procedures (Signed)
Extubation Procedure Note  Patient Details:   Name: Ronald Brady DOB: 09-27-1960 MRN: [id number]   Airway Documentation:    Vent end date: 11/04/20 Vent end time: 1145   Evaluation  O2 sats: stable throughout Complications: No apparent complications Patient did tolerate procedure well. Bilateral Breath Sounds: Diminished   Yes   Pt was extubated to 4L [place] per CCMD order with no complications noted. Pt was suctioned and had a positive cuff leak prior to extubation. Pt was able to speak and no stridor is heard at this time. Pt saturations are 97%, vitals are stable at this time, RT will continue to monitor pt status.   Ronald Brady 11/04/2020, 11:52 AM

## 2020-11-04 NOTE — Progress Notes (Signed)
eLink Physician-Brief Progress Note Patient Name: Ronald Brady DOB: 13-Oct-1960 MRN: [id number]   Date of Service  11/04/2020  HPI/Events of Note  Patient with abdominal distension and vomiting x 1 with concern for aspiration.  eICU Interventions  Enteral nutrition held, stat KUB and CXR.        Sephiroth Mcluckie U [place] Becvar 11/04/2020, 3:01 AM

## 2020-11-05 DIAGNOSIS — B954 Other streptococcus as the cause of diseases classified elsewhere: Secondary | ICD-10-CM

## 2020-11-05 DIAGNOSIS — R6521 Severe sepsis with septic shock: Secondary | ICD-10-CM | POA: Diagnosis not present

## 2020-11-05 DIAGNOSIS — A419 Sepsis, unspecified organism: Secondary | ICD-10-CM | POA: Diagnosis not present

## 2020-11-05 DIAGNOSIS — A4101 Sepsis due to Methicillin susceptible Staphylococcus aureus: Secondary | ICD-10-CM | POA: Diagnosis not present

## 2020-11-05 DIAGNOSIS — L02414 Cutaneous abscess of left upper limb: Secondary | ICD-10-CM | POA: Diagnosis not present

## 2020-11-05 DIAGNOSIS — D696 Thrombocytopenia, unspecified: Secondary | ICD-10-CM

## 2020-11-05 DIAGNOSIS — L02415 Cutaneous abscess of right lower limb: Secondary | ICD-10-CM | POA: Diagnosis not present

## 2020-11-05 LAB — TYPE AND SCREEN
ABO/RH(D): O POS
Antibody Screen: NEGATIVE
Unit division: 0
Unit division: 0
Unit division: 0
Unit division: 0
Unit division: 0
Unit division: 0
Unit division: 0
Unit division: 0
Unit division: 0
Unit division: 0
Unit division: 0
Unit division: 0
Unit division: 0
Unit division: 0
Unit division: 0
Unit division: 0
Unit division: 0
Unit division: 0
Unit division: 0
Unit division: 0

## 2020-11-05 LAB — CBC
HCT: 30.6 % — ABNORMAL LOW (ref 39.0–52.0)
Hemoglobin: 10.2 g/dL — ABNORMAL LOW (ref 13.0–17.0)
MCH: 30.2 pg (ref 26.0–34.0)
MCHC: 33.3 g/dL (ref 30.0–36.0)
MCV: 90.5 fL (ref 80.0–100.0)
Platelets: 37 10*3/uL — ABNORMAL LOW (ref 150–400)
RBC: 3.38 MIL/uL — ABNORMAL LOW (ref 4.22–5.81)
RDW: 16.7 % — ABNORMAL HIGH (ref 11.5–15.5)
WBC: 10.3 10*3/uL (ref 4.0–10.5)
nRBC: 0 % (ref 0.0–0.2)

## 2020-11-05 LAB — BPAM RBC
Blood Product Expiration Date: 202204142359
Blood Product Expiration Date: 202204162359
Blood Product Expiration Date: 202205102359
Blood Product Expiration Date: 202205112359
Blood Product Expiration Date: 202205112359
Blood Product Expiration Date: 202205112359
Blood Product Expiration Date: 202205112359
Blood Product Expiration Date: 202205112359
Blood Product Expiration Date: 202205112359
Blood Product Expiration Date: 202205112359
Blood Product Expiration Date: 202205122359
Blood Product Expiration Date: 202205122359
Blood Product Expiration Date: 202205122359
Blood Product Expiration Date: 202205122359
Blood Product Expiration Date: 202205122359
Blood Product Expiration Date: 202205122359
Blood Product Expiration Date: 202205132359
Blood Product Expiration Date: 202205142359
Blood Product Expiration Date: 202205142359
Blood Product Expiration Date: 202205142359
ISSUE DATE / TIME: 202204120828
ISSUE DATE / TIME: 202204121126
ISSUE DATE / TIME: 202204122228
ISSUE DATE / TIME: 202204122247
ISSUE DATE / TIME: 202204122304
ISSUE DATE / TIME: 202204122307
ISSUE DATE / TIME: 202204130128
ISSUE DATE / TIME: 202204130128
ISSUE DATE / TIME: 202204130220
ISSUE DATE / TIME: 202204130220
ISSUE DATE / TIME: 202204130617
ISSUE DATE / TIME: 202204130617
ISSUE DATE / TIME: 202204130633
ISSUE DATE / TIME: 202204140829
ISSUE DATE / TIME: 202204141024
ISSUE DATE / TIME: 202204141054
ISSUE DATE / TIME: 202204141520
Unit Type and Rh: 5100
Unit Type and Rh: 5100
Unit Type and Rh: 5100
Unit Type and Rh: 5100
Unit Type and Rh: 5100
Unit Type and Rh: 5100
Unit Type and Rh: 5100
Unit Type and Rh: 5100
Unit Type and Rh: 5100
Unit Type and Rh: 5100
Unit Type and Rh: 5100
Unit Type and Rh: 5100
Unit Type and Rh: 5100
Unit Type and Rh: 5100
Unit Type and Rh: 5100
Unit Type and Rh: 5100
Unit Type and Rh: 5100
Unit Type and Rh: 5100
Unit Type and Rh: 5100
Unit Type and Rh: 5100

## 2020-11-05 LAB — BASIC METABOLIC PANEL
Anion gap: 10 (ref 5–15)
BUN: 50 mg/dL — ABNORMAL HIGH (ref 6–20)
CO2: 20 mmol/L — ABNORMAL LOW (ref 22–32)
Calcium: 7.3 mg/dL — ABNORMAL LOW (ref 8.9–10.3)
Chloride: 107 mmol/L (ref 98–111)
Creatinine, Ser: 1.23 mg/dL (ref 0.61–1.24)
GFR, Estimated: >60 mL/min (ref >60)
Glucose, Bld: 149 mg/dL — ABNORMAL HIGH (ref 70–99)
Potassium: 4 mmol/L (ref 3.5–5.1)
Sodium: 137 mmol/L (ref 135–145)

## 2020-11-05 LAB — MAGNESIUM: Magnesium: 2.3 mg/dL (ref 1.7–2.4)

## 2020-11-05 LAB — GLUCOSE, CAPILLARY
Glucose-Capillary: 130 mg/dL — ABNORMAL HIGH (ref 70–99)
Glucose-Capillary: 109 mg/dL — ABNORMAL HIGH (ref 70–99)

## 2020-11-05 MED ORDER — GLYCOPYRROLATE 0.2 MG/ML IJ SOLN: 0.2000 mg | INTRAMUSCULAR | Status: DC | PRN

## 2020-11-05 MED ORDER — POLYVINYL ALCOHOL 1.4 % OP SOLN
1.0000 [drp] | Freq: Four times a day (QID) | OPHTHALMIC | Status: DC | PRN
  Filled 2020-11-05: qty 15

## 2020-11-05 MED ORDER — ACETAMINOPHEN 650 MG RE SUPP: 650.0000 mg | Freq: Four times a day (QID) | RECTAL | Status: DC | PRN

## 2020-11-05 MED ORDER — ARFORMOTEROL TARTRATE 15 MCG/2ML IN NEBU: 15.0000 ug | INHALATION_SOLUTION | Freq: Two times a day (BID) | RESPIRATORY_TRACT | Status: DC

## 2020-11-05 MED ORDER — ACETAMINOPHEN 325 MG PO TABS: 650.0000 mg | ORAL_TABLET | Freq: Four times a day (QID) | ORAL | Status: DC | PRN

## 2020-11-05 MED ORDER — IPRATROPIUM-ALBUTEROL 0.5-2.5 (3) MG/3ML IN SOLN: 3.0000 mL | RESPIRATORY_TRACT | Status: DC | PRN

## 2020-11-05 MED ORDER — GLYCOPYRROLATE 1 MG PO TABS: 1.0000 mg | ORAL_TABLET | ORAL | Status: DC | PRN

## 2020-11-05 MED ORDER — MORPHINE SULFATE (PF) 2 MG/ML IV SOLN: 2.0000 mg | INTRAVENOUS | Status: DC | PRN

## 2020-11-05 MED ORDER — DIPHENHYDRAMINE HCL 50 MG/ML IJ SOLN: 25.0000 mg | INTRAMUSCULAR | Status: DC | PRN

## 2020-11-05 MED ORDER — MORPHINE SULFATE (PF) 2 MG/ML IV SOLN
2.0000 mg | INTRAVENOUS | Status: DC | PRN
Start: 2020-11-05 — End: 2020-11-06
  Administered 2020-11-05 (×2): 2 mg via INTRAVENOUS
  Filled 2020-11-05 (×2): qty 1

## 2020-11-05 MED ORDER — BUDESONIDE 0.5 MG/2ML IN SUSP: 0.5000 mg | Freq: Two times a day (BID) | RESPIRATORY_TRACT | Status: DC

## 2020-11-05 MED ORDER — DEXTROSE 5 % IV SOLN: INTRAVENOUS | Status: DC

## 2020-11-20 NOTE — Progress Notes (Addendum)
11/18/2020 0700  SLP Visit Information  SLP Received On 11/18/2020  General Information  HPI 60 y/o M, smoker, with poorly controlled DM who presented to [facility] 4/2 after being found at home in a chair covered in urine & feces.  Seen with multiple wounds.4/02 presented to ER in shock due to multiple abscess (right thigh, left wrist), DKA. Intubated 4/12 due to hypotension and gastric bleeding. Extubated 4/15. GI says ok for clears. CXR 4/15 shows pulmonary edema. Pt was seen during admission prior to intubation. Notes report "He does admit to occasionally coughing with drink more than food and coughing up secretions once a week or every 2 weeks." Pt found clinically to have adequate tolerance of PO, recommended thin/Dys 3. Staff reported ongoing throat cleaing and coughing with this diet. He had MBS in 2021 secondary to suspected post-extubation dysphagia and at that time had been started on puree solids and pudding thick liquids.  Type of Study Bedside Swallow Evaluation  Previous Swallow Assessment see HPI  Diet Prior to this Study NPO  Temperature Spikes Noted No  Respiratory Status Nasal cannula  History of Recent Intubation Yes  Length of Intubations (days) 4 days  Date extubated 11/04/20  Oral Cavity Assessment Dry  Oral Care Completed by SLP No  Oral Cavity - Dentition Edentulous  Vision Functional for self-feeding  Self-Feeding Abilities Total assist  Patient Positioning Upright in bed  Baseline Vocal Quality Hoarse  Volitional Cough Weak  Volitional Swallow Able to elicit  Oral Motor/Sensory Function  Overall Oral Motor/Sensory Function Generalized oral weakness  Ice Chips  Ice chips Impaired  Presentation Spoon  Pharyngeal Phase Impairments Cough - Immediate  Thin Liquid  Thin Liquid Impaired  Presentation Straw  Oral Phase Functional Implications Oral holding;Prolonged oral transit  Pharyngeal  Phase Impairments Cough - Immediate  Nectar Thick Liquid  Nectar Thick Liquid NT   Honey Thick Liquid  Honey Thick Liquid NT  Puree  Puree NT  Solid  Solid NT  SLP Assessment  Clinical Impression Statement (ACUTE ONLY) Pt presents as profoundly deconditioned, resistant to most interactions with staff this am per RN. For this reason, SLP deferred OME and offered a small sip of water and bite sof ice directly. Pt recognized spoon and straw and initiated appropriate oral manipulation. Prior to POs pt with audibly congested respiration; immediate follwing swallow pt had immediate weak prolonged congested cough. Pt is not capable of protecting airway during swallowing today, will need more time to recover before considering instrumental assessment which will likely be needed if pt is willing to participate. Will follow for readiness.  SLP Visit Diagnosis Dysphagia, oropharyngeal phase (R13.12)  Impact on safety and function Severe aspiration risk  Other Related Risk Factors History of dysphagia;Deconditioning;Prolonged intubation;Decreased respiratory status;Cognitive impairment  Swallow Evaluation Recommendations  SLP Diet Recommendations NPO;Alternative means - temporary  Medication Administration Via alternative means  Treatment Plan  Oral Care Recommendations Oral care QID  Other Recommendations Have oral suction available  Treatment Recommendations Therapy as outlined in treatment plan below  Follow up Recommendations Skilled Nursing facility  Speech Therapy Frequency (ACUTE ONLY) min 2x/week  Treatment Duration 2 weeks  Interventions Trials of upgraded texture/liquids;Patient/family education;Aspiration precaution training  Prognosis  Prognosis for Safe Diet Advancement Good  Individuals Consulted  Consulted and Agree with Results and Recommendations RN;Patient unable/family or caregiver not available  Progression Toward Goals  Progression toward goals Progressing toward goals  SLP Time Calculation  SLP Start Time (ACUTE ONLY) 0850  SLP Stop  Time (ACUTE ONLY) 0858   SLP Time Calculation (min) (ACUTE ONLY) 8 min  SLP Evaluations  $ SLP Speech Visit 1 Visit  SLP Evaluations  $BSS Swallow 1 Procedure

## 2020-11-20 NOTE — Progress Notes (Signed)
[facility] P[facility]M  Family has visited with Alinda Money.  We are now transitioning to full comfort measures.  Stop levophed, stop all labs.  Move to palliative care bed.  Heber [place], MD [place] PCCM Pager: [phone] Cell: [phone] If no response, please call [phone] until 7pm After 7:00 pm call Elink  [phone]

## 2020-11-20 NOTE — Progress Notes (Signed)
[facility] P[facility]M  Called back to the patient's bedside to discuss goals of care.  He has indicated that he only wants to be comfortable at this point.  He doesn't want to go back on life support and he doesn't want any more procedures.  Considering the severity of his baseline chronic and now acute illnesses I completely agree.  His sister was present for this conversation and agrees.  We will maintain levophed as ordered for now and will stop it and move towards full comfort measures after his daughter arrives.    Heber [place], MD [place] PCCM Pager: [phone] Cell: [phone] If no response, please call [phone] until 7pm After 7:00 pm call Elink  [phone]

## 2020-11-20 NOTE — Progress Notes (Signed)
[facility] Infectious Diseases Follow Up Note  Patient Identification: Patient Name: Ronald Brady MRN: [id number] Admit Date: 10/26/2020  3:42 PM Age: 60 y.o.Today's Date: 10/26/2020   Reason for Visit: Follow-up on MSSA bacteremia  Principal Problem:   Severe sepsis with septic shock [facility](HCC) Active Problems:   Abscess of right thigh   Abscess of left forearm   Diabetes mellitus (HCC)   Hyperosmolar hyperglycemic state (HHS) (HCC)   AKI (acute kidney injury) (HCC)   Acute lower UTI   Emphysema lung (HCC)   Abscess of right lower extremity   MSSA bacteremia   Pressure injury of skin   Malnutrition of moderate degree   Respiratory failure (HCC)   Antibiotics: Cefazolin 4/3-c   Lines/Tubes: Right arm PICC 4/13, no leukocytosis.  Chest x-ray with pulmonary edema  Interval Events: Afebrile in the last 24 hours, Levophed is downtrending at 5 MCG per minute and vasopressin at 0.6203mcg/min. He was extubated and is on [place]    Assessment MSSA bacteremia secondary to left upper extremity and right lower extremity abscess status post serial I&D.  Blood cultures cleared on 4/4.  TTE with no vegetations but has mild to moderate aortic valve sclerosis/calcification.  TEE is pending  Peptostreptococcus species 1 out of 2 sets on 4/2 - on cefazolin   Shock-on low-dose Levophed in the setting of sepsis and GI bleed Acute respiratory failure, resolved  GI bleed Thrombocytopenia - in the setting of sepsis.   Recommendations Continue cefazolin as is  Follow-up TEE results when done Vent and vasopressor management per CCM GI bleed management per GI Patient seems to be refusing to care per nursing. Did not allow me to examine today. Plan for GOC discussion per RN.  HCV antibody for completeness Monitor CBC and BMP on IV antibiotics  Dr Earlene PlaterWallace will follow up from Monday.   Rest of the management as per the primary team. Thank you for  the consult. Please page with pertinent questions or concerns. ___________________________________________________________________ Subjective patient seen and examined at the bedside. Refused to talk and examine. Spoke with sister and RN at bedside who said that patient has been refusing to everything   Vitals BP 119/80   Pulse (!) 115   Temp 99.14 F (37.3 C)   Resp 20   Ht 5\' 5"  (1.651 m)   Wt 89.5 kg   SpO2 100%   BMI 32.83 kg/m     Physical Exam Sitting up in bed, on Nasal cannula, on vasopressors Left upper extremity us bandaged Looks edematous,  Has a wound vac in rt lower extremity*3. PICC line in rt arm    Pertinent Microbiology Results for orders placed or performed during the hospital encounter of Oct 29, 2020  Blood culture (routine x 2)     Status: Abnormal   Collection Time: Oct 29, 2020  4:12 PM   Specimen: Right Antecubital; Blood  Result Value Ref Range Status   Specimen Description   Final    RIGHT ANTECUBITAL BOTTLES DRAWN AEROBIC AND ANAEROBIC Performed at [facility], [address] [place], [place] [address]    Special Requests   Final    Blood Culture results may not be optimal due to an inadequate volume of blood received in culture bottles Performed at [facility], [address] [place], [place] [address]    Culture  Setup Time   Final    GRAM POSITIVE COCCI IN BOTH AEROBIC AND ANAEROBIC BOTTLES Gram Stain Report Called to,Read Back By and Verified With: CRAWFORD,H@1015  BY MATTHEWS,  B 4.3.22 CRITICAL VALUE NOTED.  VALUE IS CONSISTENT WITH PREVIOUSLY REPORTED AND CALLED VALUE. Performed at [facility], [address], [place], [place] [address]    Culture (A)  Final    STAPHYLOCOCCUS AUREUS SUSCEPTIBILITIES PERFORMED ON PREVIOUS CULTURE WITHIN THE LAST 5 DAYS. PEPTOSTREPTOCOCCUS SPECIES    Report Status 11/09/2020 FINAL  Final  Blood culture (routine x 2)     Status: Abnormal   Collection Time: 11/02/2020  4:13 PM   Specimen: Left Antecubital;  Blood  Result Value Ref Range Status   Specimen Description   Final    LEFT ANTECUBITAL BOTTLES DRAWN AEROBIC AND ANAEROBIC Performed at [facility], [address], [place], [place] [address]    Special Requests   Final    Blood Culture adequate volume Performed at [facility], [address], [place], [place] [address]    Culture  Setup Time   Final    GRAM POSITIVE COCCI AEROBIC AND ANAEROBIC BOTTLE Gram Stain Report Called to,Read Back By and Verified With: CRAWFORD,H@1015  BY MATTHEWS,B 4.3.22 Organism ID to follow CRITICAL RESULT CALLED TO, READ BACK BY AND VERIFIED WITH: S CHRISTY PHARMD  11/08/2020 EB Performed at [facility], [address], [place], [place] [address]    Culture STAPHYLOCOCCUS AUREUS (A)  Final   Report Status 10/25/2020 FINAL  Final   Organism ID, Bacteria STAPHYLOCOCCUS AUREUS  Final      Susceptibility   Staphylococcus aureus - MIC*    CIPROFLOXACIN <=0.5 SENSITIVE Sensitive     ERYTHROMYCIN <=0.25 SENSITIVE Sensitive     GENTAMICIN <=0.5 SENSITIVE Sensitive     OXACILLIN <=0.25 SENSITIVE Sensitive     TETRACYCLINE <=1 SENSITIVE Sensitive     VANCOMYCIN 1 SENSITIVE Sensitive     TRIMETH/SULFA <=10 SENSITIVE Sensitive     CLINDAMYCIN <=0.25 SENSITIVE Sensitive     RIFAMPIN <=0.5 SENSITIVE Sensitive     Inducible Clindamycin NEGATIVE Sensitive     * STAPHYLOCOCCUS AUREUS  Blood Culture ID Panel (Reflexed)     Status: Abnormal   Collection Time: 10/24/2020  4:13 PM  Result Value Ref Range Status   Enterococcus faecalis NOT DETECTED NOT DETECTED Final   Enterococcus Faecium NOT DETECTED NOT DETECTED Final   Listeria monocytogenes NOT DETECTED NOT DETECTED Final   Staphylococcus species DETECTED (A) NOT DETECTED Final    Comment: CRITICAL RESULT CALLED TO, READ BACK BY AND VERIFIED WITH: S CHRISTY PHARMD  10/31/2020 EB    Staphylococcus aureus (BCID) DETECTED (A) NOT DETECTED Final    Comment: CRITICAL RESULT CALLED TO, READ BACK BY AND  VERIFIED WITH: S CHRISTY PHARMD  10/22/2020 EB    Staphylococcus epidermidis NOT DETECTED NOT DETECTED Final   Staphylococcus lugdunensis NOT DETECTED NOT DETECTED Final   Streptococcus species NOT DETECTED NOT DETECTED Final   Streptococcus agalactiae NOT DETECTED NOT DETECTED Final   Streptococcus pneumoniae NOT DETECTED NOT DETECTED Final   Streptococcus pyogenes NOT DETECTED NOT DETECTED Final   A.calcoaceticus-baumannii NOT DETECTED NOT DETECTED Final   Bacteroides fragilis NOT DETECTED NOT DETECTED Final   Enterobacterales NOT DETECTED NOT DETECTED Final   Enterobacter cloacae complex NOT DETECTED NOT DETECTED Final   Escherichia coli NOT DETECTED NOT DETECTED Final   Klebsiella aerogenes NOT DETECTED NOT DETECTED Final   Klebsiella oxytoca NOT DETECTED NOT DETECTED Final   Klebsiella pneumoniae NOT DETECTED NOT DETECTED Final   Proteus species NOT DETECTED NOT DETECTED Final   Salmonella species NOT DETECTED NOT DETECTED Final   Serratia marcescens NOT  DETECTED NOT DETECTED Final   Haemophilus influenzae NOT DETECTED NOT DETECTED Final   Neisseria meningitidis NOT DETECTED NOT DETECTED Final   Pseudomonas aeruginosa NOT DETECTED NOT DETECTED Final   Stenotrophomonas maltophilia NOT DETECTED NOT DETECTED Final   Candida albicans NOT DETECTED NOT DETECTED Final   Candida auris NOT DETECTED NOT DETECTED Final   Candida glabrata NOT DETECTED NOT DETECTED Final   Candida krusei NOT DETECTED NOT DETECTED Final   Candida parapsilosis NOT DETECTED NOT DETECTED Final   Candida tropicalis NOT DETECTED NOT DETECTED Final   Cryptococcus neoformans/gattii NOT DETECTED NOT DETECTED Final   Meth resistant mecA/C and MREJ NOT DETECTED NOT DETECTED Final    Comment: Performed at [facility], [address], [place], [place] [address]  Resp Panel by RT-PCR (Flu A&B, Covid) Nasopharyngeal Swab     Status: None   Collection Time: 11-04-20  6:16 PM   Specimen: Nasopharyngeal Swab;  Nasopharyngeal(NP) swabs in vial transport medium  Result Value Ref Range Status   SARS Coronavirus 2 by RT PCR NEGATIVE NEGATIVE Final    Comment: (NOTE) SARS-CoV-2 target nucleic acids are NOT DETECTED.  The SARS-CoV-2 RNA is generally detectable in upper respiratory specimens during the acute phase of infection. The lowest concentration of SARS-CoV-2 viral copies this assay can detect is 138 copies/mL. A negative result does not preclude SARS-Cov-2 infection and should not be used as the sole basis for treatment or other patient management decisions. A negative result may occur with  improper specimen collection/handling, submission of specimen other than nasopharyngeal swab, presence of viral mutation(s) within the areas targeted by this assay, and inadequate number of viral copies(<138 copies/mL). A negative result must be combined with clinical observations, patient history, and epidemiological information. The expected result is Negative.  Fact Sheet for Patients:  BloggerCourse.com  Fact Sheet for Healthcare Providers:  SeriousBroker.it  This test is no t yet approved or cleared by the [place] FDA and  has been authorized for detection and/or diagnosis of SARS-CoV-2 by FDA under an Emergency Use Authorization (EUA). This EUA will remain  in effect (meaning this test can be used) for the duration of the COVID-19 declaration under Section 564(b)(1) of the Act, 21 U.S.C.section 360bbb-3(b)(1), unless the authorization is terminated  or revoked sooner.       Influenza A by PCR NEGATIVE NEGATIVE Final   Influenza B by PCR NEGATIVE NEGATIVE Final    Comment: (NOTE) The Xpert Xpress SARS-CoV-2/FLU/RSV plus assay is intended as an aid in the diagnosis of influenza from Nasopharyngeal swab specimens and should not be used as a sole basis for treatment. Nasal washings and aspirates are unacceptable for Xpert Xpress  SARS-CoV-2/FLU/RSV testing.  Fact Sheet for Patients: BloggerCourse.com  Fact Sheet for Healthcare Providers: SeriousBroker.it  This test is not yet approved or cleared by the [place] FDA and has been authorized for detection and/or diagnosis of SARS-CoV-2 by FDA under an Emergency Use Authorization (EUA). This EUA will remain in effect (meaning this test can be used) for the duration of the COVID-19 declaration under Section 564(b)(1) of the Act, 21 U.S.C. section 360bbb-3(b)(1), unless the authorization is terminated or revoked.  Performed at [facility], [address] [place], [place] [address]   Culture, Urine     Status: Abnormal   Collection Time: 11/04/20 10:36 PM   Specimen: Urine, Clean Catch  Result Value Ref Range Status   Specimen Description   Final    URINE, CLEAN CATCH Performed at  [facility], [address] [place], [place] [address]    Special Requests   Final    NONE Performed at [facility], [address], [place], [place] [address]    Culture >=100,000 COLONIES/mL STAPHYLOCOCCUS AUREUS (A)  Final   Report Status 10/26/2020 FINAL  Final   Organism ID, Bacteria STAPHYLOCOCCUS AUREUS (A)  Final      Susceptibility   Staphylococcus aureus - MIC*    CIPROFLOXACIN <=0.5 SENSITIVE Sensitive     GENTAMICIN <=0.5 SENSITIVE Sensitive     NITROFURANTOIN <=16 SENSITIVE Sensitive     OXACILLIN <=0.25 SENSITIVE Sensitive     TETRACYCLINE <=1 SENSITIVE Sensitive     VANCOMYCIN <=0.5 SENSITIVE Sensitive     TRIMETH/SULFA <=10 SENSITIVE Sensitive     CLINDAMYCIN <=0.25 SENSITIVE Sensitive     RIFAMPIN <=0.5 SENSITIVE Sensitive     Inducible Clindamycin NEGATIVE Sensitive     * >=100,000 COLONIES/mL STAPHYLOCOCCUS AUREUS  MRSA PCR Screening     Status: None   Collection Time: 10/24/2020  3:13 PM   Specimen: Nasopharyngeal  Result Value Ref Range Status   MRSA by PCR NEGATIVE NEGATIVE Final    Comment:         The GeneXpert MRSA Assay (FDA approved for NASAL specimens only), is one component of a comprehensive MRSA colonization surveillance program. It is not intended to diagnose MRSA infection nor to guide or monitor treatment for MRSA infections. Performed at [facility], [address] [place], [place] [address]   Aerobic/Anaerobic Culture w Gram Stain (surgical/deep wound)     Status: None   Collection Time: 10/28/2020  7:58 PM   Specimen: Wound  Result Value Ref Range Status   Specimen Description   Final    WOUND Performed at [facility], [address] [place], [place] [address]    Special Requests RIGHT HAMSTRING PATIENT ON FOLLOWING MERREM  Final   Gram Stain   Final    FEW WBC PRESENT,BOTH PMN AND MONONUCLEAR ABUNDANT GRAM POSITIVE COCCI IN PAIRS IN CLUSTERS    Culture   Final    ABUNDANT STAPHYLOCOCCUS AUREUS NO ANAEROBES ISOLATED Performed at [facility], [address], [place], [place] [address]    Report Status 10/29/2020 FINAL  Final   Organism ID, Bacteria STAPHYLOCOCCUS AUREUS  Final      Susceptibility   Staphylococcus aureus - MIC*    CIPROFLOXACIN <=0.5 SENSITIVE Sensitive     ERYTHROMYCIN <=0.25 SENSITIVE Sensitive     GENTAMICIN <=0.5 SENSITIVE Sensitive     OXACILLIN <=0.25 SENSITIVE Sensitive     TETRACYCLINE <=1 SENSITIVE Sensitive     VANCOMYCIN <=0.5 SENSITIVE Sensitive     TRIMETH/SULFA <=10 SENSITIVE Sensitive     CLINDAMYCIN <=0.25 SENSITIVE Sensitive     RIFAMPIN <=0.5 SENSITIVE Sensitive     Inducible Clindamycin NEGATIVE Sensitive     * ABUNDANT STAPHYLOCOCCUS AUREUS  Culture, blood (routine x 2)     Status: None   Collection Time: 10/24/20 11:48 AM   Specimen: BLOOD RIGHT HAND  Result Value Ref Range Status   Specimen Description   Final    BLOOD RIGHT HAND Performed at [facility], [address] [place], [place] [address]    Special Requests   Final    BOTTLES  DRAWN AEROBIC AND ANAEROBIC Blood Culture adequate volume Performed at [facility], [address], [place], [place] [address]    Culture   Final  NO GROWTH 5 DAYS Performed at [facility], [address] [place], [place] [address]    Report Status 10/29/2020 FINAL  Final  Culture, blood (routine x 2)     Status: None   Collection Time: 10/24/20 11:53 AM   Specimen: BLOOD RIGHT HAND  Result Value Ref Range Status   Specimen Description   Final    BLOOD RIGHT HAND Performed at [facility], [address], [place], [place] [address]    Special Requests   Final    BOTTLES DRAWN AEROBIC AND ANAEROBIC Blood Culture adequate volume Performed at [facility], [address], [place], [place] [address]    Culture   Final    NO GROWTH 5 DAYS Performed at [facility], [address], [place], [place] [address]    Report Status 10/29/2020 FINAL  Final    Pertinent Lab. CBC Latest Ref Rng & Units 2020/11/22 11/04/2020 11/04/2020  WBC 4.0 - 10.5 K/uL 10.3 - 9.2  Hemoglobin 13.0 - 17.0 g/dL 10.2(L) - 10.2(L)  Hematocrit 39.0 - 52.0 % 30.6(L) - 31.3(L)  Platelets 150 - 400 K/uL 37(L) 42(L) 41(L)   CMP Latest Ref Rng & Units 22-Nov-2020 11/04/2020 11/03/2020  Glucose 70 - 99 mg/dL 979(Y) 801(K) 553(Z)  BUN 6 - 20 mg/dL 48(O) 70(B) 86(L)  Creatinine 0.61 - 1.24 mg/dL 5.44 9.20 1.00  Sodium 135 - 145 mmol/L 137 136 140  Potassium 3.5 - 5.1 mmol/L 4.0 3.4(L) 3.7  Chloride 98 - 111 mmol/L 107 111 110  CO2 22 - 32 mmol/L 20(L) 21(L) 23  Calcium 8.9 - 10.3 mg/dL 7.3(L) 6.8(L) 7.0(L)  Total Protein 6.5 - 8.1 g/dL - - -  Total Bilirubin 0.3 - 1.2 mg/dL - - -  Alkaline Phos 38 - 126 U/L - - -  AST 15 - 41 U/L - - -  ALT 0 - 44 U/L - - -    Pertinent Imaging today Plain films and CT images have been personally visualized and interpreted; radiology reports have been reviewed. Decision making incorporated into the Impression  / Recommendations.  Chest xray 11/04/20 FINDINGS: Endotracheal tube is seen 2.4 cm above the carina. Nasogastric tube extends into the upper abdomen. Right upper extremity PICC line tip noted within the superior vena cava.  Mild bilateral perihilar and lower lung zone pulmonary infiltrates have developed suggesting mild pulmonary edema contributing to bibasilar opacification. No pneumothorax or pleural effusion. Cardiac size within normal limits.  IMPRESSION: Stable support lines and tubes.  Interval development of bibasilar pulmonary infiltrates, most suggestive of pulmonary edema.   TTE 10/24/20 IMPRESSIONS  1. Left ventricular ejection fraction, by estimation, is 60 to 65%. The  left ventricle has normal function. The left ventricle has no regional  wall motion abnormalities. Left ventricular diastolic parameters were  normal.  2. Right ventricular systolic function is normal. The right ventricular  size is normal. There is normal pulmonary artery systolic pressure.  3. The mitral valve is abnormal. No evidence of mitral valve  regurgitation. No evidence of mitral stenosis.  4. The aortic valve is tricuspid. There is mild calcification of the  aortic valve. Aortic valve regurgitation is not visualized. Mild to  moderate aortic valve sclerosis/calcification is present, without any  evidence of aortic stenosis.  5. The inferior vena cava is normal in size with greater than 50%  respiratory variability, suggesting right atrial pressure of 3 mmHg.   I have spent approx 30 minutes for this  patient encounter including review of prior medical records, coordination of care  with greater than 50% of time being face to face/counseling and discussing diagnostics/treatment plan with the patient/family.  Electronically signed by:   Odette Fraction, MD Infectious Disease Physician [facility] for Infectious Disease Pager: [phone]

## 2020-11-20 NOTE — Death Summary Note (Signed)
DEATH SUMMARY   Patient Details  Name: Ronald Brady MRN: [id number] DOB: 07-27-1960  Admission/Discharge Information   Admit Date:  11/06/2020  Date of Death: Date of Death: 2020-11-20  Time of Death: Time of Death: 1819/12/06  Length of Stay: 2022-12-19  Referring Physician: Tanna Furry, MD   Reason(s) for Hospitalization  found down, leg, wound infection  Diagnoses  Preliminary cause of death:   MSSA skin and soft tissue infection Secondary Diagnoses (including complications and co-morbidities):  Principal Problem:   Severe sepsis with septic shock [facility]) Active Problems:   Abscess of right thigh   Abscess of left forearm   Diabetes mellitus (HCC)   Hyperosmolar hyperglycemic state (HHS) (HCC)   AKI (acute kidney injury) (HCC)   Acute lower UTI   Emphysema lung (HCC)   Abscess of right lower extremity   MSSA bacteremia   Pressure injury of skin   Malnutrition of moderate degree   Respiratory failure (HCC) Severe physical deconditioning Duodenal ulcer, acute upper GI bleed Hemorrhagic anemia Acute metabolic encephalopathy Acute respiratory failure with hypoxemia due to inability to protect airway  Brief Hospital Course (including significant findings, care, treatment, and services provided and events leading to death)  Ronald Brady is a 60 y.o. 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 On admission he had septic shock, MSSA bacteremia due to multiple skin and soft tissue infections.  He was noted on admission to be frail, have severe protein calorie malnutrition, and severe physical deconditioning.  He was treated with broad spectrum antibiotics, vasopressors and required surgical debridement and wound vac placement.  During his hospitalization he suffered from a severe upper acute GI bleed from duodenal ulcers.  GI medicine was consulted and performed an endoscopy and  sprayed a prothrombotic agent on the ulcers, but unfortunately he bled again requiring IR consultation and coil embolization. He required multiple transfusions of PRBC. This stopped the bleeding and he was successfully extubated on 2022/11/21.  After extubation he refused all care.  He was conversant (though curt) and told us that he wanted [place] to make him comfortable.  His sister was brought to the bedside and attempted to have him reconsider.  However he continued to refuse care.  Per his wishes we initiated comfort care orders, withdrew his vasopressor support and used morphine for comfort.  He died peacefully with family at the bedside.    Pertinent Labs and Studies  Significant Diagnostic Studies DG Forearm Left  Result Date: 11/06/20 CLINICAL DATA:  Infected wound to the distal left forearm. EXAM: LEFT FOREARM - 2 VIEW COMPARISON:  09/05/2020 FINDINGS: Intravenous catheter in the antecubital fossa region. Decreased soft tissue swelling since previous study. No soft tissue gas. No evidence of acute fracture or dislocation. No focal bone lesion or bone destruction. Degenerative changes in the wrist. IMPRESSION: Decreased soft tissue swelling since previous study. No acute bony abnormalities. Electronically Signed   By: Burman Nieves M.D.   On: 11/06/2020 20:09   DG Abd 1 View  Result Date: 11/04/2020 CLINICAL DATA:  Possible ileus. EXAM: ABDOMEN - 1 VIEW COMPARISON:  August 19, 2019 FINDINGS: A nasogastric tube is seen with its distal tip and distal side hole overlying the body of the stomach. There is no definite evidence of bowel dilatation. Moderate amount of stool is seen throughout the large bowel. Radiopaque surgical coils are seen overlying the medial aspect of  the mid to upper right abdomen. No radio-opaque calculi or other significant radiographic abnormality are seen. IMPRESSION: 1. Nasogastric tube positioning, as described above, without evidence of bowel obstruction or ileus. CT  correlation is recommended if this remains of clinical concern. Electronically Signed   By: Aram Candelahaddeus  Houston M.D.   On: 11/04/2020 03:38   IR Angiogram Visceral Selective  Result Date: 11/07/2020 INDICATION: 60 year old gentleman with light threatening acute upper GI bleed presents today measure radiology for angiogram and possible embolization. Upper endoscopy showed multiple duodenal ulcers. EXAM: 1. Ultrasound-guided access of right common femoral artery. 2. Superior mesenteric angiogram 3. Celiac angiogram 4. Gastro duodenal artery angiogram and branch coil embolization MEDICATIONS: None ANESTHESIA/SEDATION: None CONTRAST:  90 mL of Omnipaque 300 intra arterial FLUOROSCOPY TIME:  Fluoroscopy Time: 12 minutes 42 seconds (889 mGy). COMPLICATIONS: None immediate. PROCEDURE: Informed consent was obtained from the patient's sister following explanation of the procedure, risks, benefits and alternatives. The patient's sister understands, agrees and consents for the procedure. All questions were addressed. The patient was intubated prior to the procedure and could not be consented himself. Given patient's hemorrhage and hypertension, emergent consent was obtained from patient's sister. A time out was performed prior to the initiation of the procedure. Maximal barrier sterile technique utilized including caps, mask, sterile gowns, sterile gloves, large sterile drape, hand hygiene, and chlorhexidine prep. Right groin prepped and draped in usual fashion. Ultrasound image documenting patency of the right common femoral artery was obtained and placed in permanent medical record. Sterile ultrasound gel and probe cover utilized throughout the procedure. Using continuous ultrasound guidance, the right common femoral artery was accessed with a 21 gauge needle. A 0.018 inch guidewire advanced through the 21 gauge needle without resistance. 21 gauge needle exchanged for transitional dilator set over 0.018 inch guidewire.  Transitional dilator set exchanged for 5 French sheath over 0.035 inch guidewire. VS 1 catheter utilized to select the superior mesenteric artery. Angiogram demonstrated no active extravasation. VS 1 catheter then utilized to select the celiac artery. Angiogram of the celiac artery demonstrated patent splenic, common hepatic, and left gastric branches. Active extravasation was seen in the region of the gastric duodenal artery. Progreat microcatheter was utilized to access the gastric duodenal artery. Two main branches were identified, 1 of which contributed to the hemorrhage. This branch was selected with the Progreat microcatheter and access was advanced distal to the site of extravasation. Embolization was performed with detachable Ruby coils. One 2 mm, three 3 mm, two 4 mm, and one 5 mm coil was used. Post embolization angiogram demonstrated no additional extravasation. Additional pancreaticoduodenal branch originating from the GDA was selected and angiograms performed. No extravasation was identified. Progreat microcatheter retracted to the common hepatic artery. Angiogram was performed. No extravasation identified. Progreat and VS 1 catheter were removed and sheath angiogram was performed. Given the diminutive size of the vessels, no closure device was utilized. Right groin sheath was removed and hemostasis achieved with 20 minutes of manual compression. IMPRESSION: Mesenteric angiogram revealed active extravasation from gastric duodenal artery branch, which was treated with coil embolization. Electronically Signed   By: Acquanetta BellingFarhaan  Mir M.D.   On: 10/31/2020 13:35   IR Angiogram Visceral Selective  Result Date: 11/10/2020 INDICATION: 60 year old gentleman with light threatening acute upper GI bleed presents today measure radiology for angiogram and possible embolization. Upper endoscopy showed multiple duodenal ulcers. EXAM: 1. Ultrasound-guided access of right common femoral artery. 2. Superior mesenteric  angiogram 3. Celiac angiogram 4. Gastro duodenal artery angiogram and  branch coil embolization MEDICATIONS: None ANESTHESIA/SEDATION: None CONTRAST:  90 mL of Omnipaque 300 intra arterial FLUOROSCOPY TIME:  Fluoroscopy Time: 12 minutes 42 seconds (889 mGy). COMPLICATIONS: None immediate. PROCEDURE: Informed consent was obtained from the patient's sister following explanation of the procedure, risks, benefits and alternatives. The patient's sister understands, agrees and consents for the procedure. All questions were addressed. The patient was intubated prior to the procedure and could not be consented himself. Given patient's hemorrhage and hypertension, emergent consent was obtained from patient's sister. A time out was performed prior to the initiation of the procedure. Maximal barrier sterile technique utilized including caps, mask, sterile gowns, sterile gloves, large sterile drape, hand hygiene, and chlorhexidine prep. Right groin prepped and draped in usual fashion. Ultrasound image documenting patency of the right common femoral artery was obtained and placed in permanent medical record. Sterile ultrasound gel and probe cover utilized throughout the procedure. Using continuous ultrasound guidance, the right common femoral artery was accessed with a 21 gauge needle. A 0.018 inch guidewire advanced through the 21 gauge needle without resistance. 21 gauge needle exchanged for transitional dilator set over 0.018 inch guidewire. Transitional dilator set exchanged for 5 French sheath over 0.035 inch guidewire. VS 1 catheter utilized to select the superior mesenteric artery. Angiogram demonstrated no active extravasation. VS 1 catheter then utilized to select the celiac artery. Angiogram of the celiac artery demonstrated patent splenic, common hepatic, and left gastric branches. Active extravasation was seen in the region of the gastric duodenal artery. Progreat microcatheter was utilized to access the gastric  duodenal artery. Two main branches were identified, 1 of which contributed to the hemorrhage. This branch was selected with the Progreat microcatheter and access was advanced distal to the site of extravasation. Embolization was performed with detachable Ruby coils. One 2 mm, three 3 mm, two 4 mm, and one 5 mm coil was used. Post embolization angiogram demonstrated no additional extravasation. Additional pancreaticoduodenal branch originating from the GDA was selected and angiograms performed. No extravasation was identified. Progreat microcatheter retracted to the common hepatic artery. Angiogram was performed. No extravasation identified. Progreat and VS 1 catheter were removed and sheath angiogram was performed. Given the diminutive size of the vessels, no closure device was utilized. Right groin sheath was removed and hemostasis achieved with 20 minutes of manual compression. IMPRESSION: Mesenteric angiogram revealed active extravasation from gastric duodenal artery branch, which was treated with coil embolization. Electronically Signed   By: Acquanetta Belling M.D.   On: 10/30/2020 13:35   IR Angiogram Selective Each Additional Vessel  Result Date: 11/04/2020 INDICATION: 60 year old gentleman with light threatening acute upper GI bleed presents today measure radiology for angiogram and possible embolization. Upper endoscopy showed multiple duodenal ulcers. EXAM: 1. Ultrasound-guided access of right common femoral artery. 2. Superior mesenteric angiogram 3. Celiac angiogram 4. Gastro duodenal artery angiogram and branch coil embolization MEDICATIONS: None ANESTHESIA/SEDATION: None CONTRAST:  90 mL of Omnipaque 300 intra arterial FLUOROSCOPY TIME:  Fluoroscopy Time: 12 minutes 42 seconds (889 mGy). COMPLICATIONS: None immediate. PROCEDURE: Informed consent was obtained from the patient's sister following explanation of the procedure, risks, benefits and alternatives. The patient's sister understands, agrees and  consents for the procedure. All questions were addressed. The patient was intubated prior to the procedure and could not be consented himself. Given patient's hemorrhage and hypertension, emergent consent was obtained from patient's sister. A time out was performed prior to the initiation of the procedure. Maximal barrier sterile technique utilized including caps, mask, sterile gowns, sterile  gloves, large sterile drape, hand hygiene, and chlorhexidine prep. Right groin prepped and draped in usual fashion. Ultrasound image documenting patency of the right common femoral artery was obtained and placed in permanent medical record. Sterile ultrasound gel and probe cover utilized throughout the procedure. Using continuous ultrasound guidance, the right common femoral artery was accessed with a 21 gauge needle. A 0.018 inch guidewire advanced through the 21 gauge needle without resistance. 21 gauge needle exchanged for transitional dilator set over 0.018 inch guidewire. Transitional dilator set exchanged for 5 French sheath over 0.035 inch guidewire. VS 1 catheter utilized to select the superior mesenteric artery. Angiogram demonstrated no active extravasation. VS 1 catheter then utilized to select the celiac artery. Angiogram of the celiac artery demonstrated patent splenic, common hepatic, and left gastric branches. Active extravasation was seen in the region of the gastric duodenal artery. Progreat microcatheter was utilized to access the gastric duodenal artery. Two main branches were identified, 1 of which contributed to the hemorrhage. This branch was selected with the Progreat microcatheter and access was advanced distal to the site of extravasation. Embolization was performed with detachable Ruby coils. One 2 mm, three 3 mm, two 4 mm, and one 5 mm coil was used. Post embolization angiogram demonstrated no additional extravasation. Additional pancreaticoduodenal branch originating from the GDA was selected and  angiograms performed. No extravasation was identified. Progreat microcatheter retracted to the common hepatic artery. Angiogram was performed. No extravasation identified. Progreat and VS 1 catheter were removed and sheath angiogram was performed. Given the diminutive size of the vessels, no closure device was utilized. Right groin sheath was removed and hemostasis achieved with 20 minutes of manual compression. IMPRESSION: Mesenteric angiogram revealed active extravasation from gastric duodenal artery branch, which was treated with coil embolization. Electronically Signed   By: Acquanetta Belling M.D.   On: 10/29/2020 13:35   CT WRIST LEFT WO CONTRAST  Result Date: 11/18/2020 CLINICAL DATA:  Left wrist draining abscess.  Sepsis. EXAM: CT OF THE LEFT WRIST WITHOUT CONTRAST TECHNIQUE: Multidetector CT imaging was performed according to the standard protocol. Multiplanar CT image reconstructions were also generated. COMPARISON:  Plain films 11/19/2020 FINDINGS: There is soft tissue swelling noted along the anterior aspect of the wrist and distal forearm. Edema noted throughout the tissues without well-defined focal abscess. There are locules of gas within the soft tissues. Findings most suggestive of cellulitis. No acute bony abnormality. No bone destruction to suggest osteomyelitis. IMPRESSION: Edema and locules of gas throughout the subcutaneous soft tissues in the anterior distal forearm. No well-defined focal abscess. This could be further evaluated with ultrasound if felt clinically indicated. Electronically Signed   By: Charlett Nose M.D.   On: 10/30/2020 00:36   CT FEMUR RIGHT WO CONTRAST  Result Date: 10/25/2020 CLINICAL DATA:  Hamstrings abscess assess collection EXAM: CT OF THE LOWER RIGHT EXTREMITY WITHOUT CONTRAST TECHNIQUE: Multidetector CT imaging of the right lower extremity was performed according to the standard protocol. COMPARISON:  October 22, 2020 FINDINGS: Bones/Joint/Cartilage No areas of periosteal  reaction or cortical destruction is seen. No fracture or dislocation. The articular surfaces appear to be maintained. Ligaments Suboptimally assessed by CT. Muscles and Tendons Within the hamstrings musculature the of the level of the mid to distal fever there remains a multilocular collection containing foci of gas measuring approximately 5.4 x 3.7 x 6.0 cm. Overlying subcutaneous emphysema and postsurgical changes are noted along the posterior upper thigh. Soft tissues As described above there is postsurgical changes seen within the  posterior upper thigh. IMPRESSION: There remains a multilocular fluid collection within the lower hamstrings muscles at the level of the mid to distal femur with foci of subcutaneous emphysema measuring 5.4 x 3.7 x 3 x 6.0 cm. Electronically Signed   By: Jonna Clark M.D.   On: 10/25/2020 19:22   CT FEMUR RIGHT WO CONTRAST  Result Date: 11/14/2020 CLINICAL DATA:  Pain and swelling in right thigh. Soft tissue infection suspected. EXAM: CT OF THE LOWER RIGHT EXTREMITY WITHOUT CONTRAST TECHNIQUE: Multidetector CT imaging of the right lower extremity was performed according to the standard protocol. COMPARISON:  None. FINDINGS: Foley catheter present in the bladder. Large stool burden in the visualized rectum raising the possibility of fecal impaction. Recommend clinical correlation. Mild prostate enlargement with calcifications. Large soft tissue defect noted in the posteromedial upper right thigh. The defect extends down to the muscles. Gas and fluid noted within the soft tissues along the surface of the hamstring muscles extending from the soft tissue defect in the proximal thigh distally to just above the knee concerning for abscess. It is difficult to determine if this is within the hamstring muscles or along the surface of the muscle. Largest collection of gas and fluid seen on axial image 187 of series 7 measures approximately 3.3 x 2.1 cm. There are some locules of gas which  extend deep into the musculature (image 166, 154, 188) compatible with intramuscular abscesses. Extensive vascular calcifications. No acute bony abnormality. No fracture. No bone destruction to suggest osteomyelitis. IMPRESSION: Elongated gas and fluid collection noted along the surface of the hamstring muscles, in some areas extending deep into the hamstring muscles compatible with intramuscular abscess. This extends over an extensive area in length extending from the soft tissue defect in the proximal posteromedial right thigh distally to just above the knee. No evidence of osteomyelitis. Electronically Signed   By: Charlett Nose M.D.   On: 10/29/2020 20:25   IR US Guide Vasc Access Right  Result Date: 10/31/2020 INDICATION: 60 year old gentleman with light threatening acute upper GI bleed presents today measure radiology for angiogram and possible embolization. Upper endoscopy showed multiple duodenal ulcers. EXAM: 1. Ultrasound-guided access of right common femoral artery. 2. Superior mesenteric angiogram 3. Celiac angiogram 4. Gastro duodenal artery angiogram and branch coil embolization MEDICATIONS: None ANESTHESIA/SEDATION: None CONTRAST:  90 mL of Omnipaque 300 intra arterial FLUOROSCOPY TIME:  Fluoroscopy Time: 12 minutes 42 seconds (889 mGy). COMPLICATIONS: None immediate. PROCEDURE: Informed consent was obtained from the patient's sister following explanation of the procedure, risks, benefits and alternatives. The patient's sister understands, agrees and consents for the procedure. All questions were addressed. The patient was intubated prior to the procedure and could not be consented himself. Given patient's hemorrhage and hypertension, emergent consent was obtained from patient's sister. A time out was performed prior to the initiation of the procedure. Maximal barrier sterile technique utilized including caps, mask, sterile gowns, sterile gloves, large sterile drape, hand hygiene, and chlorhexidine  prep. Right groin prepped and draped in usual fashion. Ultrasound image documenting patency of the right common femoral artery was obtained and placed in permanent medical record. Sterile ultrasound gel and probe cover utilized throughout the procedure. Using continuous ultrasound guidance, the right common femoral artery was accessed with a 21 gauge needle. A 0.018 inch guidewire advanced through the 21 gauge needle without resistance. 21 gauge needle exchanged for transitional dilator set over 0.018 inch guidewire. Transitional dilator set exchanged for 5 French sheath over 0.035 inch guidewire. VS 1  catheter utilized to select the superior mesenteric artery. Angiogram demonstrated no active extravasation. VS 1 catheter then utilized to select the celiac artery. Angiogram of the celiac artery demonstrated patent splenic, common hepatic, and left gastric branches. Active extravasation was seen in the region of the gastric duodenal artery. Progreat microcatheter was utilized to access the gastric duodenal artery. Two main branches were identified, 1 of which contributed to the hemorrhage. This branch was selected with the Progreat microcatheter and access was advanced distal to the site of extravasation. Embolization was performed with detachable Ruby coils. One 2 mm, three 3 mm, two 4 mm, and one 5 mm coil was used. Post embolization angiogram demonstrated no additional extravasation. Additional pancreaticoduodenal branch originating from the GDA was selected and angiograms performed. No extravasation was identified. Progreat microcatheter retracted to the common hepatic artery. Angiogram was performed. No extravasation identified. Progreat and VS 1 catheter were removed and sheath angiogram was performed. Given the diminutive size of the vessels, no closure device was utilized. Right groin sheath was removed and hemostasis achieved with 20 minutes of manual compression. IMPRESSION: Mesenteric angiogram revealed  active extravasation from gastric duodenal artery branch, which was treated with coil embolization. Electronically Signed   By: Acquanetta Belling M.D.   On: 11/12/2020 13:35   DG CHEST PORT 1 VIEW  Result Date: 11/04/2020 CLINICAL DATA:  Respiratory failure EXAM: PORTABLE CHEST 1 VIEW COMPARISON:  11/07/2020 FINDINGS: Endotracheal tube is seen 2.4 cm above the carina. Nasogastric tube extends into the upper abdomen. Right upper extremity PICC line tip noted within the superior vena cava. Mild bilateral perihilar and lower lung zone pulmonary infiltrates have developed suggesting mild pulmonary edema contributing to bibasilar opacification. No pneumothorax or pleural effusion. Cardiac size within normal limits. IMPRESSION: Stable support lines and tubes. Interval development of bibasilar pulmonary infiltrates, most suggestive of pulmonary edema. Electronically Signed   By: Helyn Numbers MD   On: 11/04/2020 03:11   DG CHEST PORT 1 VIEW  Result Date: 11/06/2020 CLINICAL DATA:  Intubated EXAM: PORTABLE CHEST 1 VIEW COMPARISON:  11/12/2020, 11/18/2020 FINDINGS: Interval intubation, tip of the endotracheal tube about a cm superior to the carina. Right upper extremity central venous catheter tip over the SVC. Esophageal tube tip below the diaphragm but incompletely visualized. Slightly improved aeration at the bases. Hazy left lung base opacity may reflect pleural fluid, atelectasis or minimal infiltrate. IMPRESSION: Interval intubation with tip of the endotracheal tube about a cm superior to the carina. Slightly improved aeration at the bases. Electronically Signed   By: Jasmine Pang M.D.   On: 11/07/2020 00:43   DG CHEST PORT 1 VIEW  Result Date: 10/28/2020 CLINICAL DATA:  Bacteremia. EXAM: PORTABLE CHEST 1 VIEW COMPARISON:  Chest x-ray 11/11/2020. FINDINGS: Right IJ line in stable position. Heart size normal. Low lung volumes. Bibasilar infiltrates. Small left pleural effusion cannot be excluded. No  pneumothorax. IMPRESSION: 1.  Right IJ line stable position. 2. Low lung volumes with bibasilar infiltrates. Small left pleural effusion cannot be excluded. Electronically Signed   By: Maisie Fus  Register   On: 11/18/2020 05:21   DG Chest Portable 1 View  Result Date: 10/21/2020 CLINICAL DATA:  Dyspnea. EXAM: PORTABLE CHEST 1 VIEW COMPARISON:  10/21/2020 FINDINGS: Right IJ central venous catheter unchanged. Lungs are adequately inflated without focal airspace consolidation, effusion or pneumothorax. Cardiomediastinal silhouette and remainder of the exam is unchanged. IMPRESSION: No active disease. Electronically Signed   By: Elberta Fortis M.D.   On: 11/11/2020 14:05  DG Chest Portable 1 View  Result Date: 11/12/2020 CLINICAL DATA:  Central line placement EXAM: PORTABLE CHEST 1 VIEW COMPARISON:  Chest radiograph from one day prior. FINDINGS: Right internal jugular central venous catheter terminates at the cavoatrial junction. Stable cardiomediastinal silhouette with normal heart size. No pneumothorax. No pleural effusion. Lungs appear clear, with no acute consolidative airspace disease and no pulmonary edema. Healed deformities in multiple posterior right mid ribs. IMPRESSION: Right internal jugular central venous catheter terminates at the cavoatrial junction. No pneumothorax. No active cardiopulmonary disease. Electronically Signed   By: Delbert Phenix M.D.   On: 10/30/2020 06:31   DG Chest Port 1 View  Result Date: 11/11/2020 CLINICAL DATA:  60 year old male with weakness. EXAM: PORTABLE CHEST 1 VIEW COMPARISON:  Chest radiograph dated 08/18/2019 FINDINGS: Background of chronic interstitial coarsening and bronchitic changes. No focal consolidation, pleural effusion, or pneumothorax. The cardiac silhouette is within limits. No acute osseous pathology. IMPRESSION: No acute cardiopulmonary process. Electronically Signed   By: Elgie Collard M.D.   On: 10/24/2020 17:09   ECHOCARDIOGRAM COMPLETE  Result  Date: 10/24/2020    ECHOCARDIOGRAM REPORT   Patient Name:   ANTHONEY SHEPPARD [facility] Date of Exam: 10/24/2020 Medical Rec #:  [id number]         Height:       65.0 in Accession #:    [id number]        Weight:       166.9 lb Date of Birth:  16-Feb-1961         BSA:          1.832 m Patient Age:    60 years          BP:           93/66 mmHg Patient Gender: M                 HR:           100 bpm. Exam Location:  Inpatient Procedure: 2D Echo, Color Doppler and Cardiac Doppler Indications:    Bacteremia R78.81  History:        Patient has no prior history of Echocardiogram examinations.                 Emphysema. Severe sepsis. Soft tissue infection-abscess. MSSA                 bacteremia.  Sonographer:    Leta Jungling RDCS Referring Phys: [phone] MARK C YATES IMPRESSIONS  1. Left ventricular ejection fraction, by estimation, is 60 to 65%. The left ventricle has normal function. The left ventricle has no regional wall motion abnormalities. Left ventricular diastolic parameters were normal.  2. Right ventricular systolic function is normal. The right ventricular size is normal. There is normal pulmonary artery systolic pressure.  3. The mitral valve is abnormal. No evidence of mitral valve regurgitation. No evidence of mitral stenosis.  4. The aortic valve is tricuspid. There is mild calcification of the aortic valve. Aortic valve regurgitation is not visualized. Mild to moderate aortic valve sclerosis/calcification is present, without any evidence of aortic stenosis.  5. The inferior vena cava is normal in size with greater than 50% respiratory variability, suggesting right atrial pressure of 3 mmHg. FINDINGS  Left Ventricle: Left ventricular ejection fraction, by estimation, is 60 to 65%. The left ventricle has normal function. The left ventricle has no regional wall motion abnormalities. The left ventricular internal cavity size was normal in size. There is  no left  ventricular hypertrophy. Left ventricular diastolic parameters  were normal. Right Ventricle: The right ventricular size is normal. No increase in right ventricular wall thickness. Right ventricular systolic function is normal. There is normal pulmonary artery systolic pressure. The tricuspid regurgitant velocity is 2.33 m/s, and  with an assumed right atrial pressure of 3 mmHg, the estimated right ventricular systolic pressure is 24.7 mmHg. Left Atrium: Left atrial size was normal in size. Right Atrium: Right atrial size was normal in size. Pericardium: There is no evidence of pericardial effusion. Mitral Valve: The mitral valve is abnormal. There is mild thickening of the mitral valve leaflet(s). There is mild calcification of the mitral valve leaflet(s). No evidence of mitral valve regurgitation. No evidence of mitral valve stenosis. Tricuspid Valve: The tricuspid valve is normal in structure. Tricuspid valve regurgitation is trivial. No evidence of tricuspid stenosis. Aortic Valve: The aortic valve is tricuspid. There is mild calcification of the aortic valve. Aortic valve regurgitation is not visualized. Mild to moderate aortic valve sclerosis/calcification is present, without any evidence of aortic stenosis. Pulmonic Valve: The pulmonic valve was not well visualized. Pulmonic valve regurgitation is not visualized. No evidence of pulmonic stenosis. Aorta: The aortic root is normal in size and structure. Venous: The inferior vena cava is normal in size with greater than 50% respiratory variability, suggesting right atrial pressure of 3 mmHg. IAS/Shunts: No atrial level shunt detected by color flow Doppler.  LEFT VENTRICLE PLAX 2D LVIDd:         4.50 cm  Diastology LVIDs:         3.20 cm  LV e' medial:    9.90 cm/s LV PW:         0.90 cm  LV E/e' medial:  8.8 LV IVS:        0.80 cm  LV e' lateral:   12.60 cm/s LVOT diam:     2.00 cm  LV E/e' lateral: 6.9 LV SV:         71 LV SV Index:   39 LVOT Area:     3.14 cm  RIGHT VENTRICLE RV S prime:     14.60 cm/s TAPSE (M-mode): 2.2  cm LEFT ATRIUM             Index       RIGHT ATRIUM           Index LA diam:        3.50 cm 1.91 cm/m  RA Area:     11.30 cm LA Vol (A2C):   19.6 ml 10.70 ml/m RA Volume:   23.50 ml  12.83 ml/m LA Vol (A4C):   17.0 ml 9.28 ml/m LA Biplane Vol: 18.2 ml 9.94 ml/m  AORTIC VALVE LVOT Vmax:   131.00 cm/s LVOT Vmean:  87.700 cm/s LVOT VTI:    0.225 m  AORTA Ao Root diam: 3.30 cm Ao Asc diam:  2.50 cm MITRAL VALVE               TRICUSPID VALVE MV Area (PHT): 3.27 cm    TR Peak grad:   21.7 mmHg MV Decel Time: 232 msec    TR Vmax:        233.00 cm/s MV E velocity: 87.00 cm/s MV A velocity: 71.10 cm/s  SHUNTS MV E/A ratio:  1.22        Systemic VTI:  0.22 m  Systemic Diam: 2.00 cm Charlton Haws MD Electronically signed by Charlton Haws MD Signature Date/Time: 10/24/2020/3:25:23 PM    Final    IR EMBO ART  VEN HEMORR LYMPH EXTRAV  INC GUIDE ROADMAPPING  Result Date: 11/04/2020 INDICATION: 60 year old gentleman with light threatening acute upper GI bleed presents today measure radiology for angiogram and possible embolization. Upper endoscopy showed multiple duodenal ulcers. EXAM: 1. Ultrasound-guided access of right common femoral artery. 2. Superior mesenteric angiogram 3. Celiac angiogram 4. Gastro duodenal artery angiogram and branch coil embolization MEDICATIONS: None ANESTHESIA/SEDATION: None CONTRAST:  90 mL of Omnipaque 300 intra arterial FLUOROSCOPY TIME:  Fluoroscopy Time: 12 minutes 42 seconds (889 mGy). COMPLICATIONS: None immediate. PROCEDURE: Informed consent was obtained from the patient's sister following explanation of the procedure, risks, benefits and alternatives. The patient's sister understands, agrees and consents for the procedure. All questions were addressed. The patient was intubated prior to the procedure and could not be consented himself. Given patient's hemorrhage and hypertension, emergent consent was obtained from patient's sister. A time out was performed prior  to the initiation of the procedure. Maximal barrier sterile technique utilized including caps, mask, sterile gowns, sterile gloves, large sterile drape, hand hygiene, and chlorhexidine prep. Right groin prepped and draped in usual fashion. Ultrasound image documenting patency of the right common femoral artery was obtained and placed in permanent medical record. Sterile ultrasound gel and probe cover utilized throughout the procedure. Using continuous ultrasound guidance, the right common femoral artery was accessed with a 21 gauge needle. A 0.018 inch guidewire advanced through the 21 gauge needle without resistance. 21 gauge needle exchanged for transitional dilator set over 0.018 inch guidewire. Transitional dilator set exchanged for 5 French sheath over 0.035 inch guidewire. VS 1 catheter utilized to select the superior mesenteric artery. Angiogram demonstrated no active extravasation. VS 1 catheter then utilized to select the celiac artery. Angiogram of the celiac artery demonstrated patent splenic, common hepatic, and left gastric branches. Active extravasation was seen in the region of the gastric duodenal artery. Progreat microcatheter was utilized to access the gastric duodenal artery. Two main branches were identified, 1 of which contributed to the hemorrhage. This branch was selected with the Progreat microcatheter and access was advanced distal to the site of extravasation. Embolization was performed with detachable Ruby coils. One 2 mm, three 3 mm, two 4 mm, and one 5 mm coil was used. Post embolization angiogram demonstrated no additional extravasation. Additional pancreaticoduodenal branch originating from the GDA was selected and angiograms performed. No extravasation was identified. Progreat microcatheter retracted to the common hepatic artery. Angiogram was performed. No extravasation identified. Progreat and VS 1 catheter were removed and sheath angiogram was performed. Given the diminutive size  of the vessels, no closure device was utilized. Right groin sheath was removed and hemostasis achieved with 20 minutes of manual compression. IMPRESSION: Mesenteric angiogram revealed active extravasation from gastric duodenal artery branch, which was treated with coil embolization. Electronically Signed   By: Acquanetta Belling M.D.   On: 10/31/2020 13:35   [place] EKG SITE RITE  Result Date: 11/10/2020 If Site Rite image not attached, placement could not be confirmed due to current cardiac rhythm.  [place] EKG SITE RITE  Result Date: 10/28/2020 If Site Rite image not attached, placement could not be confirmed due to current cardiac rhythm.  CT Angio Abd/Pel w/ and/or w/o  Result Date: 10/26/2020 CLINICAL DATA:  GI bleed. Abdominal pain. Severe sepsis and septic shock. EXAM: CTA ABDOMEN AND PELVIS WITHOUT AND WITH CONTRAST  TECHNIQUE: Multidetector CT imaging of the abdomen and pelvis was performed using the standard protocol during bolus administration of intravenous contrast. Multiplanar reconstructed images and MIPs were obtained and reviewed to evaluate the vascular anatomy. CONTRAST:  OMNIPAQUE IOHEXOL 350 MG/ML SOLN COMPARISON:  None. FINDINGS: VASCULAR Aorta: Normal caliber aorta without aneurysm, dissection, vasculitis or significant stenosis. Celiac: Patent without evidence of aneurysm, dissection, vasculitis or significant stenosis. SMA: Patent without evidence of aneurysm, dissection, vasculitis or significant stenosis. Renals: Both renal arteries are patent without evidence of aneurysm, dissection, vasculitis, fibromuscular dysplasia or significant stenosis. IMA: Patent without evidence of aneurysm, dissection, vasculitis or significant stenosis. Inflow: Patent without evidence of aneurysm, dissection, vasculitis or significant stenosis. Proximal Outflow: Bilateral common femoral and visualized portions of the superficial and profunda femoral arteries are patent without evidence of aneurysm, dissection,  vasculitis or significant stenosis. Veins: No obvious venous abnormality within the limitations of this arterial phase study. Review of the MIP images confirms the above findings. NON-VASCULAR Lower chest: There is small bilateral pleural effusions. There is adjacent atelectasis.The heart size is normal. The intracardiac blood pool is hypodense relative to the adjacent myocardium consistent with anemia. Hepatobiliary: The liver is normal. Normal gallbladder.There is no biliary ductal dilation. Pancreas: Normal contours without ductal dilatation. No peripancreatic fluid collection. Spleen: Unremarkable. Adrenals/Urinary Tract: --Adrenal glands: Unremarkable. --Right kidney/ureter: No hydronephrosis or radiopaque kidney stones. --Left kidney/ureter: No hydronephrosis or radiopaque kidney stones. --Urinary bladder: The urinary bladder is significantly distended. Stomach/Bowel: --Stomach/Duodenum: There is suggestion of diffuse gastric wall thickening. The distal esophagus appears to be thickened with intraluminal fluid. 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 There appears to be diffuse wall thickening of the duodenum. --Colon: There is mild wall thickening of the rectum. There is no evidence for active arterial GI bleeding. --Appendix: Normal. Vascular/Lymphatic: Atherosclerotic calcification is present within the non-aneurysmal abdominal aorta, without hemodynamically significant stenosis. --No retroperitoneal lymphadenopathy. --No mesenteric lymphadenopathy. --No pelvic or inguinal lymphadenopathy. Reproductive: Unremarkable Other: There is a small volume of free fluid in the abdomen. There is anasarca. There is a small bowel containing umbilical hernia without evidence for obstruction. Musculoskeletal. 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 IMPRESSION: 1. No evidence for active arterial GI bleeding. 2. Diffuse gastric wall thickening and  duodenal wall thickening, suggestive of gastritis and duodenitis. Incidentally noted small bowel malrotation. 3. Small bilateral pleural effusions with adjacent atelectasis. 4. Anasarca. 5. Distended urinary bladder. Aortic Atherosclerosis (ICD10-I70.0). Electronically Signed   By: Katherine Mantle M.D.   On: 10/26/2020 23:27    Microbiology No results found for this or any previous visit (from the past 240 hour(s)).  Lab Basic Metabolic Panel: Recent Labs  Lab 10/30/2020 0958 11/03/20 0517 11/03/20 1530 11/04/20 0300 11/03/2020 0400  NA 138 134* 140 136 137  K 4.5 4.1 3.7 3.4* 4.0  CL 108 106 110 111 107  CO2 18* 22 23 21* 20*  GLUCOSE 312* 122* 177* 303* 149*  BUN 23* 31* 34* 31* 50*  CREATININE 0.82 1.03 1.04 0.97 1.23  CALCIUM 6.5* 6.7* 7.0* 6.8* 7.3*  MG 1.5*  --  1.4* 1.6* 2.3  PHOS 6.9*  --  4.8* 3.9  --    Liver Function Tests: Recent Labs  Lab 11/12/2020 0456 11/12/2020 0958 11/03/20 0517  AST 11* 140* 62*  ALT <5 37 19  ALKPHOS 109 75 48  BILITOT <0.1* 0.9 0.9  PROT 4.4* 3.8* 3.9*  ALBUMIN 1.2*  1.9* 2.3*   No results for input(s): LIPASE, AMYLASE in the last 168 hours. No results for input(s): AMMONIA in the last 168 hours. CBC: Recent Labs  Lab 11/10/2020 0456 11/12/2020 1511 11/06/2020 1552 11/03/20 0517 11/04/20 0300 11/04/20 1019 11/04/20 1203 10/25/2020 0400  WBC 5.1   < > 10.8* 7.3 2.8* 9.2  --  10.3  NEUTROABS 3.1  --   --   --   --   --   --   --   HGB 4.6*   < > 13.6 10.1* 10.1* 10.2*  --  10.2*  HCT 14.8*   < > 38.7* 29.0* 30.0* 31.3*  --  30.6*  MCV 92.5   < > 85.6 86.3 89.6 91.3  --  90.5  PLT 169   < > 74* 50* 45* 41* 42* 37*   < > = values in this interval not displayed.   Cardiac Enzymes: No results for input(s): CKTOTAL, CKMB, CKMBINDEX, TROPONINI in the last 168 hours. Sepsis Labs: Recent Labs  Lab 11/08/2020 1000 11/14/2020 1552 11/03/20 0517 11/04/20 0300 11/04/20 1019 10/21/2020 0400  WBC  --  10.8* 7.3 2.8* 9.2 10.3  LATICACIDVEN 4.8*  3.5*  --   --   --   --     Procedures/Operations  Surgical debridement of right leg and left wrist Upper endoscopy Coil embolization of gastroduodenal artery Endotracheal intubation   [place] 11/06/2020, 11:13 AM

## 2020-11-20 NOTE — Progress Notes (Signed)
PT Cancellation Note  Patient Details Name: Ronald Brady MRN: [id number] DOB: 11/23/1960   Cancelled Treatment:    Reason Eval/Treat Not Completed: Other (comment);  Pt moved to comfort measures. Signing off.    [facility] 10/29/2020, 3:41 PM

## 2020-11-20 NOTE — Progress Notes (Signed)
Ronald Brady 11:01 AM  Subjective: Patient seen and examined and case discussed with the ICU team as well as the patient's nurse and he is extubated but is not communicative but no signs of bleeding and has not passed his swallowing study yet so is not eating  Objective: Vital signs stable afebrile does not follow commands abdomen is not as firm as yesterday but he still does not like it being touched platelet count continues to drop hemoglobin stable increased BUN and creatinine Assessment: Multiple medical problems currently without signs of GI blood loss  Plan: Might need head CT and possibly abdominal CT if symptoms continue and ICU team to have conversation with his sister later today and please call me this weekend if I could be of any further assistance  [facility]  office [phone] After 5PM or if no answer call [phone]

## 2020-11-20 NOTE — Progress Notes (Signed)
Responding to a page Chaplain checked in with pt's nurse upon arrival to learn pt was rejecting any more medical intervention and was refusing to connect with his sister who had stepped out of the room.  Chaplain entered room and spoke quietly to Ronald Brady.  He did not respond. As I was leaving met his sister Ronald Brady in the hallway. Stepping back into the pt's room Chaplain offered ministry of presence with pt's sister, learning her husband (a Theme park manager) had just left after praying over Ronald Brady.  Chaplain learned sister is in complete accord with Ronald Brady refusing any additional care, reporting he had quit communicating with her yesterday.  Chaplain advised sister Ronald Brady she could have me paged if she or her brother need additional support.  [place]

## 2020-11-20 NOTE — Progress Notes (Signed)
OT Cancellation Note  Patient Details Name: Ronald Brady MRN: [id number] DOB: 1961/01/17   Cancelled Treatment:    Reason Eval/Treat Not Completed:  Pt moved to comfort measures. Signing off.  Evern Bio 11/02/2020, 3:29 PM  Martie Round, OTR/L Acute Rehabilitation Services Pager: [phone] Office: [phone]

## 2020-11-20 NOTE — Progress Notes (Signed)
NAME:  Ronald Brady, MRN:  [id number], DOB:  June 12, 1961, LOS: 68 ADMISSION DATE:  11/12/2020, CONSULTATION DATE:  4/3 REFERRING MD:  TRH, CHIEF COMPLAINT:  Found minimally responsive, multiple wounds   History of Present Illness:  60 y/o M, smoker, with poorly controlled DM who presented to [facility] 4/2 after being found at home in a chair covered in urine & feces.  Seen with multiple wounds. He remains in critical condition in the [facility] ICU.  Pertinent  Medical History  Tobacco Abuse - began smoking at 16 Worked as a logger  Poorly controlled DM - Hgb A1c 11.8 in 07/2019  HTN  Significant Hospital Events: Including procedures, antibiotic start and stop dates in addition to other pertinent events    4/02 presented to ER in shock due to multiple abscess (right thigh, left wrist), DKA. CT femur found elongated gas and fluid collection in the right hamstring c/w abscess. No evidence of osteomyelitis. Treated with clinda, meropenem, cultured, central line placed. I&D in ER.   4/03 Wrist CT with gas throughout the SQ tissues in the anterior distal forearm. Ortho consulted > taken to OR for debridement of right posterior hamstring and left forearm. Intraoperative cultures obtained. Cultures growing staph aureus (sensitivities pending). Vanco added but abx narrowed to cefazolin late pm  4/04 Ortho note reflects concern for poor wound healing, may need hip disarticulation. On levophed 1-2 mcg, BCID with staph aureus, UC 100k staph aureus.  ECHO 60-65% EF, no evidence of vegetation mentioned  4/5 CT Femur w/o contrast with multilocular fluid collection in the lower hamstrings muscles at the level of the mid to distal femur with foci of SQ emphysema  4/6 GIB overnight, Hgb down to 5.9, tx 2 units PRBC's, 1 unit FFP, PPI gtt initiated + IVF.  Follow up Hgb 7.3, remains on low dose pressors. GI consulted.   4/8 Repeat I&D of left wrist and right thigh, extensive purulent material with new abscess seen near  pelvis   4/9 weaning pressors, decreased meal time insulin  4/10 intermittent hypotension, stop diuresis  4/11 on pressors, MAP above goal, wean  4/12 almost off NE, I&D of abscess rt knee and hamstring, L FA dressing change  PM 4/12-4/13: Hypotensive, increased NE, HGB drop to 3.8, large melanotic stool, intubated, fem CVC placement, aline placement, OGT bright red blood. Total 10 PRBC, 4 FFP, 2 plt per chart. 1 dose ceftriaxone. EGD Therisa Doyne MD) Non bleeding duodenal ulcer, injected, suctioned 1350 of fresh blood with cont bleeding. IR Active extrav from Gastroduodenal artery branch, coil and embo completed. Large volume resuscitation. . 4/15 extubated  Interim History / Subjective:  Minimally responsive Extubated yesterday Oliguric Significant scrotal edema Off levophed Not speaking to [place] or following commands, but will swat at [place] when we try to examine [place], pushing medical staff away  Objective   Blood pressure 119/80, pulse (!) 115, temperature 99.14 F (37.3 C), resp. rate 20, height 5' 5" (1.651 m), weight 89.5 kg, SpO2 100 %. CVP:  [4 mmHg-30 mmHg] 21 mmHg      Intake/Output Summary (Last 24 hours) at 2020-11-08 0824 Last data filed at November 08, 2020 0160 Gross per 24 hour  Intake 1664.02 ml  Output 700 ml  Net 964.02 ml   Filed Weights   10/24/20 0515 10/29/2020 1740 11/04/2020 1400  Weight: 75.7 kg 75.7 kg 89.5 kg    Examination: General:  Mild tachypnea in bed HENT: NCAT OP clear PULM: Mild rhonchi bilaterally, increased effort but no accessory muscle  use CV: RRR, no mgr GI: BS+, soft, nontender MSK: normal bulk and tone Neuro: awake, doesn't speak to [place], blank stare but stirs and tracks when you walk in the room, when I examined him he forcefully grabbed my hand and pushed me away several times   Labs/imaging that I havepersonally reviewed  (right click and "Reselect all SmartList Selections" daily)  Cr up slightly  WBC, Hgb stable Afebrile  Resolved Hospital  Problem list   Septic and hemorrhagic shock  Assessment & Plan:  MSSA abscess R thigh/left wrist and bacteremia Wound vacs to continue  TEE when medically appropriate > would hold off over weekend as mental status and respiratory status are guarded Continue cefazolin  Acute blood loss anemia from GI bleeding> no active bleeding on 4/16 Thrombocytopenia Monitor for bleeding Transfuse PRBC for Hgb < 7 gm/dL  GI bleed> duodenal ulcers, s/p coil embolization; currently not bleeding Monitor for bleeding Transfuse PRBC for Hgb < 7 gm/dL Continue PPI  Severe protein calorie malnutrition SLP evaluation  If able to take enteric nutrition> prefer po vs NG tube Consider TPN as last resort  Acute metabolic encephalopathy, near catatonic appearance on 4/16 but he is making very purposeful and aggressive actions towards staff to prevent [place] from delivering care; suspect profound depression Hold sedatives See comment below regarding goals of care  Acute respiratory failure with hypoxemia > resolved COPD, cigarette smoker Add brovana/pulmicort Change duoneb to prn Stop albuterol prn  AKI> worse, contrast related Anasarca on exam Continue D10 Hold lasix for now, would not give cystalloid given anasarca  DM2 SSI  Goals of care: I met with his sister Shirlean Mylar and discussed his situation at length today.  He has had a prolonged hospitalization with multiple severe medical problems most recently complicated by life threatening bleeding.  He is currently not interacting with [place] other than to push [place] away, demonstrating a level of consciousness and ability to interact that is predominantly focused on avoiding medical care.  We have asked his sister to engage him to help [place] understand if he wants more aggressive medical care or if we should change goals of care to comfort only.  Given his malnourished state his overall prognosis is poor.   Best practice (right click and "Reselect all SmartList  Selections" daily)  Diet:  NPO Pain/Anxiety/Delirium protocol (if indicated): No VAP protocol (if indicated): Not indicated DVT prophylaxis: SCD GI prophylaxis: PPI Glucose control:  SSI Yes Central venous access:  Yes, and it is still needed Arterial line:  N/A Foley:  Yes, and it is still needed Mobility:  bed rest  PT consulted: Yes Last date of multidisciplinary goals of care discussion [4/16 see above] Code Status:  full code Disposition: remain in ICU  Labs   CBC: Recent Labs  Lab 11/08/2020 0456 10/25/2020 1511 11/14/2020 1552 11/03/20 0517 11/04/20 0300 11/04/20 1019 11/04/20 1203 11-29-2020 0400  WBC 5.1   < > 10.8* 7.3 2.8* 9.2  --  10.3  NEUTROABS 3.1  --   --   --   --   --   --   --   HGB 4.6*   < > 13.6 10.1* 10.1* 10.2*  --  10.2*  HCT 14.8*   < > 38.7* 29.0* 30.0* 31.3*  --  30.6*  MCV 92.5   < > 85.6 86.3 89.6 91.3  --  90.5  PLT 169   < > 74* 50* 45* 41* 42* 37*   < > = values in this  interval not displayed.    Basic Metabolic Panel: Recent Labs  Lab 10/31/2020 0958 11/03/20 0517 11/03/20 1530 11/04/20 0300 11-07-2020 0400  NA 138 134* 140 136 137  K 4.5 4.1 3.7 3.4* 4.0  CL 108 106 110 111 107  CO2 18* 22 23 21* 20*  GLUCOSE 312* 122* 177* 303* 149*  BUN 23* 31* 34* 31* 50*  CREATININE 0.82 1.03 1.04 0.97 1.23  CALCIUM 6.5* 6.7* 7.0* 6.8* 7.3*  MG 1.5*  --  1.4* 1.6* 2.3  PHOS 6.9*  --  4.8* 3.9  --    GFR: Estimated Creatinine Clearance: 67.3 mL/min (by C-G formula based on SCr of 1.23 mg/dL). Recent Labs  Lab 10/27/2020 1000 10/29/2020 1552 11/03/20 0517 11/04/20 0300 11/04/20 1019 2020/11/07 0400  WBC  --  10.8* 7.3 2.8* 9.2 10.3  LATICACIDVEN 4.8* 3.5*  --   --   --   --     Liver Function Tests: Recent Labs  Lab 11/06/2020 0456 11/07/2020 0958 11/03/20 0517  AST 11* 140* 62*  ALT <5 37 19  ALKPHOS 109 75 48  BILITOT <0.1* 0.9 0.9  PROT 4.4* 3.8* 3.9*  ALBUMIN 1.2* 1.9* 2.3*   No results for input(s): LIPASE, AMYLASE in the last 168  hours. No results for input(s): AMMONIA in the last 168 hours.  ABG    Component Value Date/Time   PHART 7.249 (L) 10/24/2020 0614   PCO2ART 33.5 10/31/2020 0614   PO2ART 306 (H) 10/28/2020 0614   HCO3 15.1 (L) 11/11/2020 0614   TCO2 32 11/19/2020 1912   ACIDBASEDEF 11.9 (H) 11/04/2020 0614   O2SAT 99.8 11/19/2020 0614     Coagulation Profile: Recent Labs  Lab 10/28/2020 0105 11/06/2020 0958 11/04/20 1203  INR 2.3* 1.6* 1.4*    Cardiac Enzymes: No results for input(s): CKTOTAL, CKMB, CKMBINDEX, TROPONINI in the last 168 hours.  HbA1C: Hgb A1c MFr Bld  Date/Time Value Ref Range Status  10/24/2020 08:39 AM 12.9 (H) 4.8 - 5.6 % Final    Comment:    (NOTE) Pre diabetes:          5.7%-6.4%  Diabetes:              >6.4%  Glycemic control for   <7.0% adults with diabetes   10/24/2020 04:20 PM 12.7 (H) 4.8 - 5.6 % Final    Comment:    (NOTE)         Prediabetes: 5.7 - 6.4         Diabetes: >6.4         Glycemic control for adults with diabetes: <7.0     CBG: Recent Labs  Lab 11/04/20 1559 11/04/20 1932 11/04/20 2327 2020-11-07 0346 11/07/20 0755  GLUCAP 151* 189* 166* 130* 109*     Critical care time: 45 minutes    Roselie Awkward, MD [facility] Pager: [phone] Cell: [phone] If no response, please call [phone] until 7pm After 7:00 pm call Elink  [phone]

## 2020-11-20 NOTE — Progress Notes (Signed)
PT Cancellation Note  Patient Details Name: Ronald Brady MRN: [id number] DOB: September 05, 1960   Cancelled Treatment:    Reason Eval/Treat Not Completed: Other (comment); defer today per RN, pt is refusing most interventions and not appropriate for PT at this time. Will continue efforts to complete PT eval    [facility] 11/18/2020, 12:50 PM

## 2020-11-20 DEATH — deceased

## 2021-08-12 IMAGING — XA IR US GUIDE VASC ACCESS RIGHT
9 of 10 series · 13 of 24 positions shown · IV contrast (IODINE)
Comparison: none

INDICATION: 60-year-old gentleman with light threatening acute upper GI bleed
presents today measure radiology for angiogram and possible
embolization. Upper endoscopy showed multiple duodenal ulcers.

[Series 3: care body 4 · 2 acquisitions, 1 frame shown (1 of 8)]
[im 1/2]
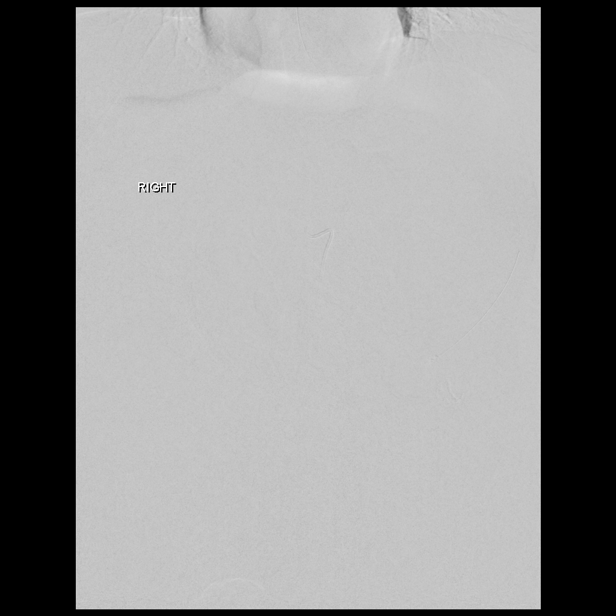

[Series 4: care body 4 · 2 acquisitions, 1 frame shown (2 of 8)]
[im 1/2]
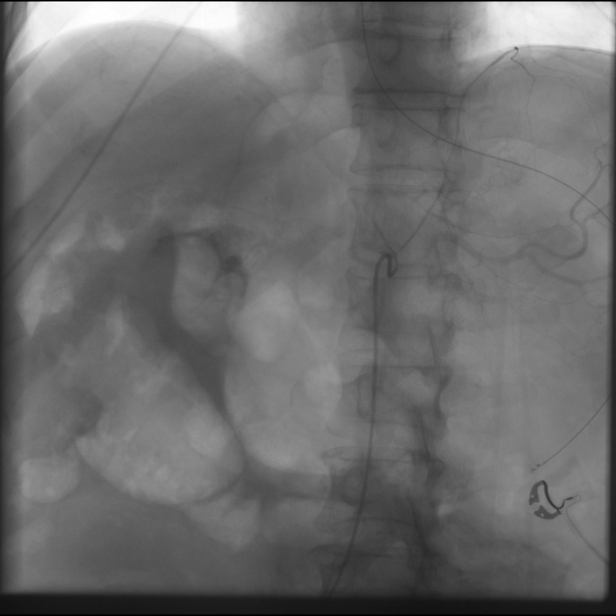

[Series 5: care body 4 · 2 acquisitions, 2 frames shown (3 of 8)]
[im 1/2]
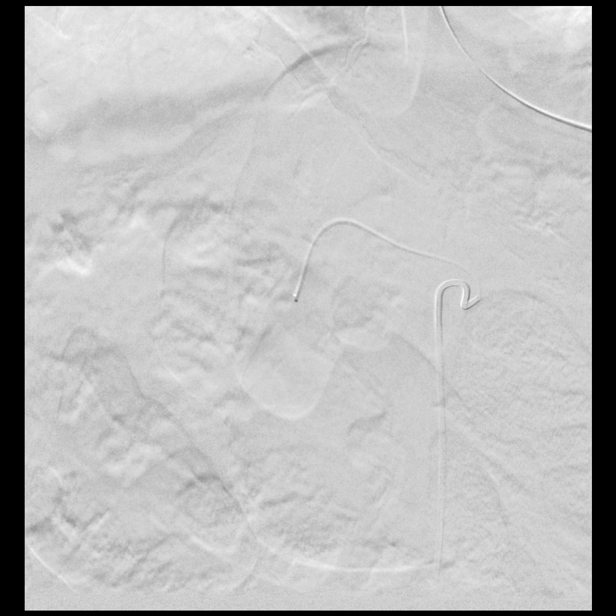
[im 2/2]
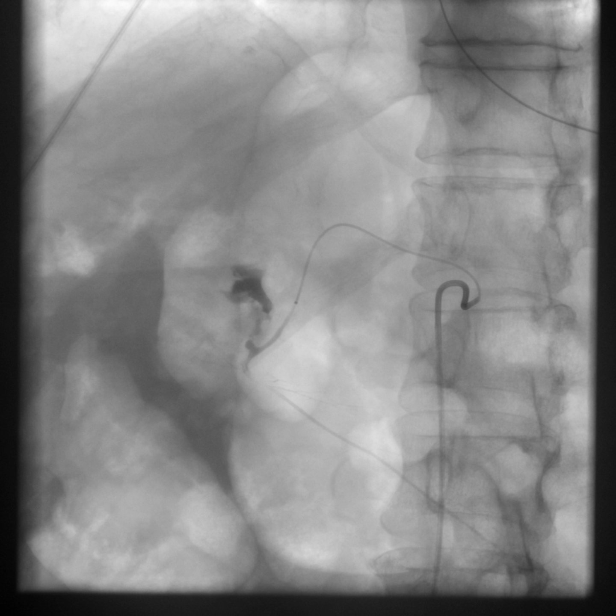

[Series 7: care body 4 · 2 acquisitions, 2 frames shown (4 of 8)]
[im 1/2]
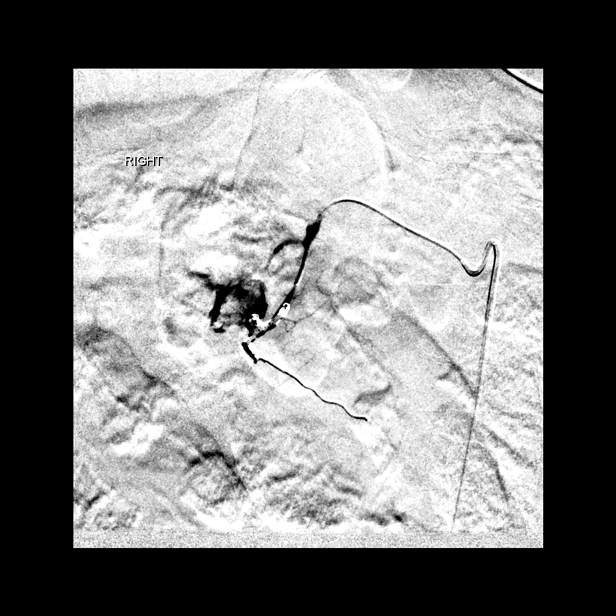
[im 2/2]
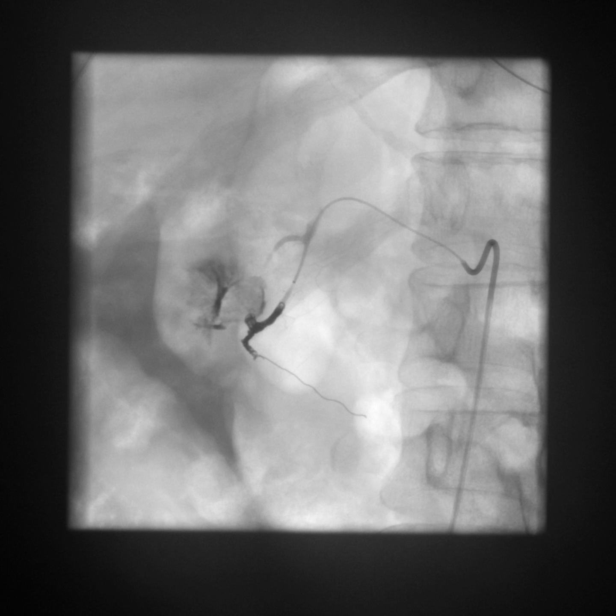

[Series 8: care body 4 · 2 acquisitions, 1 frame shown (5 of 8)]
[im 2/2]
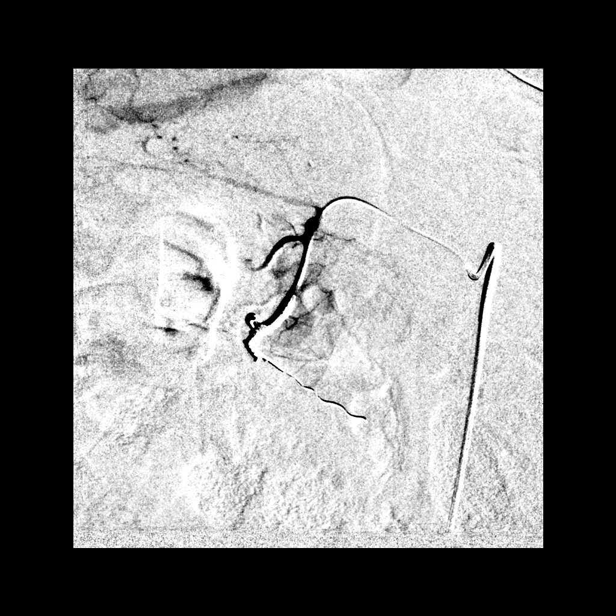

[Series 9: care body 4 · 2 acquisitions, 2 frames shown (6 of 8)]
[im 1/2]
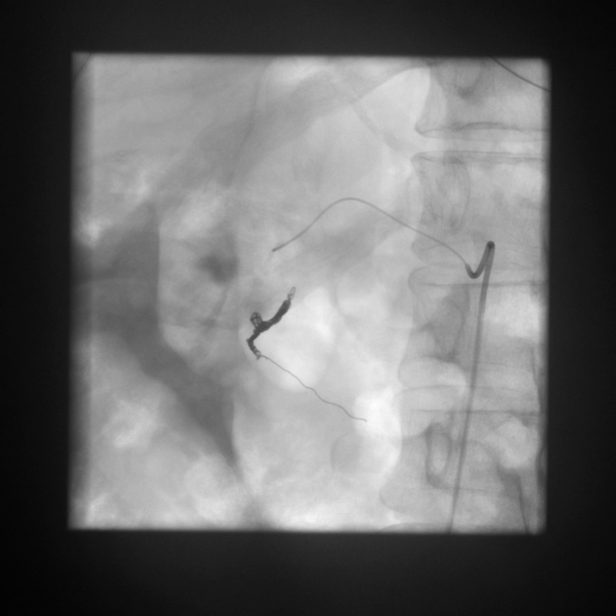
[im 2/2]
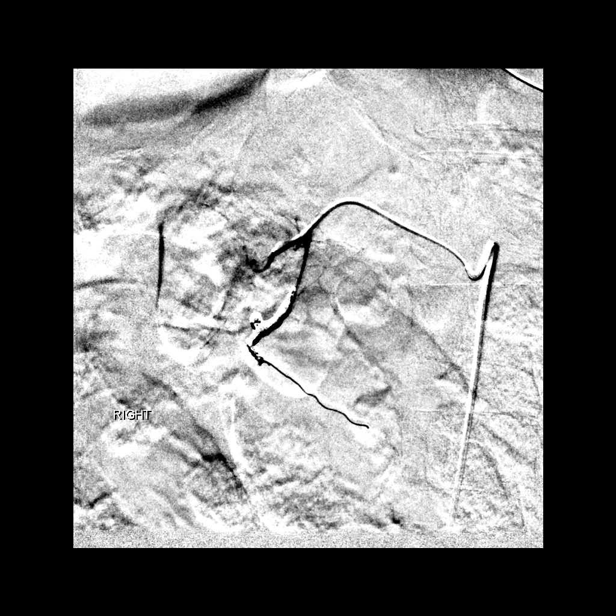

[Series 10: care body 4 · 2 acquisitions, 1 frame shown (7 of 8)]
[im 2/2]
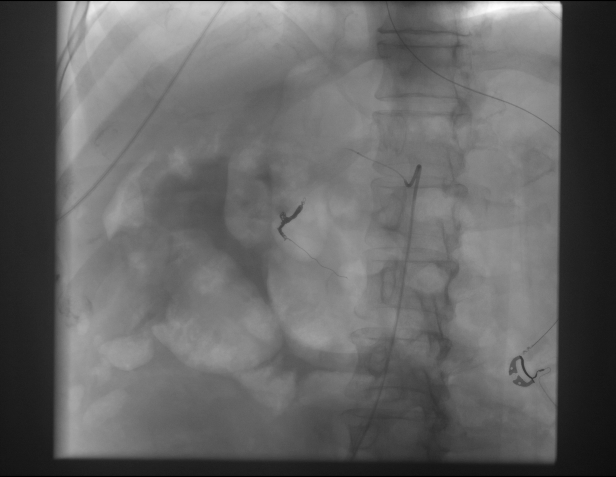

[Series 11: care body 4 · 2 acquisitions, 1 frame shown (8 of 8)]
[im 1/2]
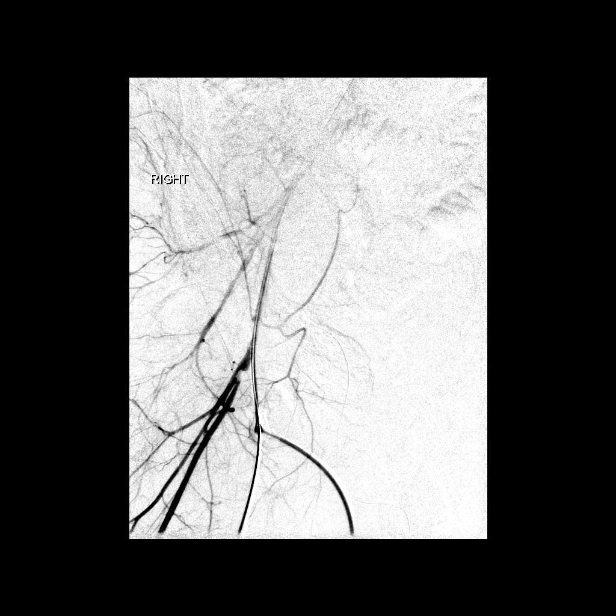

[Series 300: ld dsa body · 2 of 8 slices shown]
[im 2/8]
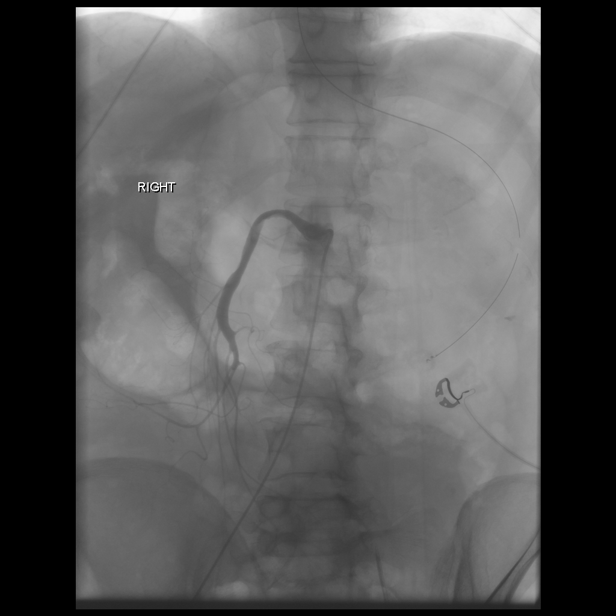
[im 8/8]
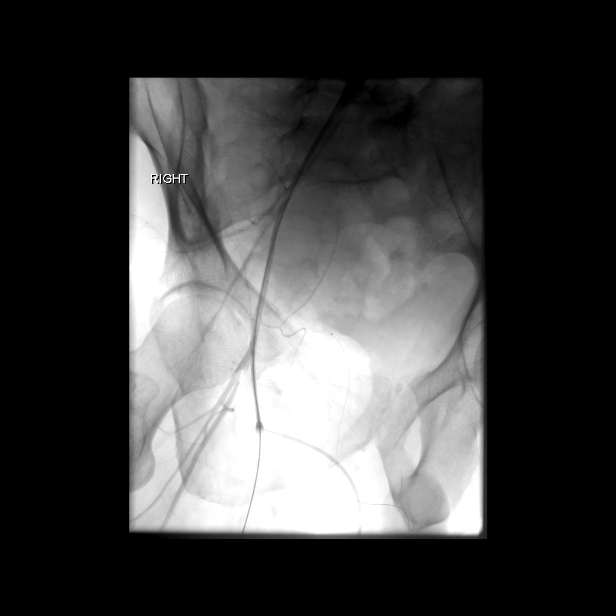

[13 of 24 positions shown; findings below may reference images not displayed]

EXAM:
1. Ultrasound-guided access of right common femoral artery.
2. Superior mesenteric angiogram
3. Celiac angiogram
4. Gastro duodenal artery angiogram and branch coil embolization

MEDICATIONS:
None

ANESTHESIA/SEDATION:
None

CONTRAST:  90 mL of Omnipaque 300 intra arterial

FLUOROSCOPY TIME:  Fluoroscopy Time: 12 minutes 42 seconds (889
mGy).

COMPLICATIONS:
None immediate.

PROCEDURE:
Informed consent was obtained from the patient's sister following
explanation of the procedure, risks, benefits and alternatives. The
patient's sister understands, agrees and consents for the procedure.
All questions were addressed. The patient was intubated prior to the
procedure and could not be consented himself. Given patient's
hemorrhage and hypertension, emergent consent was obtained from
patient's sister.

A time out was performed prior to the initiation of the procedure.
Maximal barrier sterile technique utilized including caps, mask,
sterile gowns, sterile gloves, large sterile drape, hand hygiene,
and chlorhexidine prep.

Right groin prepped and draped in usual fashion. Ultrasound image
documenting patency of the right common femoral artery was obtained
and placed in permanent medical record. Sterile ultrasound gel and
probe cover utilized throughout the procedure. Using continuous
ultrasound guidance, the right common femoral artery was accessed
with a 21 gauge needle. A 0.018 inch guidewire advanced through the
21 gauge needle without resistance. 21 gauge needle exchanged for
transitional dilator [DATE] inch guidewire. Transitional
dilator set exchanged for 5 French sheath over 0.035 inch guidewire.

VS 1 catheter utilized to select the superior mesenteric artery.
Angiogram demonstrated no active extravasation.

VS 1 catheter then utilized to select the celiac artery. Angiogram
of the celiac artery demonstrated patent splenic, common hepatic,
and left gastric branches. Active extravasation was seen in the
region of the gastric duodenal artery.

Progreat microcatheter was utilized to access the gastric duodenal
artery. Two main branches were identified, 1 of which contributed to
the hemorrhage. This branch was selected with the Progreat
microcatheter and access was advanced distal to the site of
extravasation. Embolization was performed with detachable Ruby
coils. One 2 mm, three 3 mm, two 4 mm, and one 5 mm coil was used.

Post embolization angiogram demonstrated no additional
extravasation. Additional pancreaticoduodenal branch originating
from the GDA was selected and angiograms performed. No extravasation
was identified.

Progreat microcatheter retracted to the common hepatic artery.
Angiogram was performed. No extravasation identified.

Progreat and VS 1 catheter were removed and sheath angiogram was
performed. Given the diminutive size of the vessels, no closure
device was utilized.

Right groin sheath was removed and hemostasis achieved with 20
minutes of manual compression.
IMPRESSION: Mesenteric angiogram revealed active extravasation from gastric
duodenal artery branch, which was treated with coil embolization.
